# Patient Record
Sex: Male | Born: 1975 | Race: White | Hispanic: No | Marital: Single | Smoking: Former smoker
Health system: Southern US, Community
[De-identification: ages and names within clinical notes are randomized; demographics above are authoritative.]

## PROBLEM LIST (undated history)

## (undated) DIAGNOSIS — Z87442 Personal history of urinary calculi: Secondary | ICD-10-CM

## (undated) DIAGNOSIS — R569 Unspecified convulsions: Secondary | ICD-10-CM

## (undated) DIAGNOSIS — G4730 Sleep apnea, unspecified: Secondary | ICD-10-CM

## (undated) HISTORY — PX: EYE SURGERY: SHX253

## (undated) HISTORY — PX: HERNIA REPAIR: SHX51

## (undated) HISTORY — PX: DEEP BRAIN STIMULATOR PLACEMENT: SHX608

---

## 2000-10-07 ENCOUNTER — Emergency Department (HOSPITAL_COMMUNITY): Admission: EM | Admit: 2000-10-07 | Discharge: 2000-10-07 | Payer: Self-pay

## 2000-10-07 ENCOUNTER — Encounter: Payer: Self-pay | Admitting: Emergency Medicine

## 2000-12-18 ENCOUNTER — Inpatient Hospital Stay (HOSPITAL_COMMUNITY): Admission: EM | Admit: 2000-12-18 | Discharge: 2000-12-20 | Payer: Self-pay | Admitting: Emergency Medicine

## 2000-12-18 ENCOUNTER — Encounter: Payer: Self-pay | Admitting: Pediatrics

## 2000-12-19 ENCOUNTER — Encounter: Payer: Self-pay | Admitting: Pulmonary Disease

## 2001-01-20 ENCOUNTER — Ambulatory Visit (HOSPITAL_COMMUNITY): Admission: RE | Admit: 2001-01-20 | Discharge: 2001-01-20 | Payer: Self-pay | Admitting: *Deleted

## 2001-01-20 ENCOUNTER — Encounter: Payer: Self-pay | Admitting: *Deleted

## 2001-01-21 ENCOUNTER — Emergency Department (HOSPITAL_COMMUNITY): Admission: EM | Admit: 2001-01-21 | Discharge: 2001-01-21 | Payer: Self-pay | Admitting: Emergency Medicine

## 2001-04-24 ENCOUNTER — Encounter: Payer: Self-pay | Admitting: Emergency Medicine

## 2001-04-24 ENCOUNTER — Inpatient Hospital Stay (HOSPITAL_COMMUNITY): Admission: EM | Admit: 2001-04-24 | Discharge: 2001-04-26 | Payer: Self-pay | Admitting: Emergency Medicine

## 2004-07-19 ENCOUNTER — Ambulatory Visit (HOSPITAL_COMMUNITY): Admission: RE | Admit: 2004-07-19 | Discharge: 2004-07-19 | Payer: Self-pay | Admitting: Neurology

## 2004-10-28 ENCOUNTER — Emergency Department (HOSPITAL_COMMUNITY): Admission: EM | Admit: 2004-10-28 | Discharge: 2004-10-28 | Payer: Self-pay | Admitting: Emergency Medicine

## 2006-04-24 ENCOUNTER — Emergency Department (HOSPITAL_COMMUNITY): Admission: EM | Admit: 2006-04-24 | Discharge: 2006-04-24 | Payer: Self-pay | Admitting: Emergency Medicine

## 2006-08-22 ENCOUNTER — Emergency Department (HOSPITAL_COMMUNITY): Admission: EM | Admit: 2006-08-22 | Discharge: 2006-08-23 | Payer: Self-pay | Admitting: Emergency Medicine

## 2006-08-22 ENCOUNTER — Emergency Department (HOSPITAL_COMMUNITY): Admission: EM | Admit: 2006-08-22 | Discharge: 2006-08-22 | Payer: Self-pay | Admitting: Family Medicine

## 2006-11-21 ENCOUNTER — Ambulatory Visit: Payer: Self-pay | Admitting: Internal Medicine

## 2006-11-21 DIAGNOSIS — R569 Unspecified convulsions: Secondary | ICD-10-CM | POA: Insufficient documentation

## 2007-01-24 ENCOUNTER — Ambulatory Visit: Payer: Self-pay | Admitting: Internal Medicine

## 2008-01-09 ENCOUNTER — Ambulatory Visit: Payer: Self-pay | Admitting: Internal Medicine

## 2009-01-14 ENCOUNTER — Ambulatory Visit: Payer: Self-pay | Admitting: Internal Medicine

## 2009-12-24 ENCOUNTER — Ambulatory Visit: Payer: Self-pay | Admitting: Internal Medicine

## 2010-05-06 NOTE — Assessment & Plan Note (Signed)
Summary: flu shot/njr  Nurse Visit  Flu Vaccine Consent Questions     Do you have a history of severe allergic reactions to this vaccine? no    Any prior history of allergic reactions to egg and/or gelatin? no    Do you have a sensitivity to the preservative Thimersol? no    Do you have a past history of Guillan-Barre Syndrome? no    Do you currently have an acute febrile illness? no    Have you ever had a severe reaction to latex? no    Vaccine information given and explained to patient? yes    Are you currently pregnant? no    Lot Number:[id number]   Exp Date:10/01/2009   Site Given  Left Deltoid IM  Orders Added: 1)  Admin 1st Vaccine [90471] 2)  Flu Vaccine 1yrs + [24401]

## 2010-05-06 NOTE — Assessment & Plan Note (Signed)
Summary: FLU-SHOT/RCD  Nurse Visit    Prior Medications: KEPPRA 1000 MG  TABS (LEVETIRACETAM) 21/2 daily LYRICA 225 MG  CAPS (PREGABALIN) one tid LAMICTAL 200 MG  TABS (LAMOTRIGINE) one tid    Influenza Vaccine    Vaccine Type: Fluvax 3+    Given by: Arcola Jansky, RN  Flu Vaccine Consent Questions    Do you have a history of severe allergic reactions to this vaccine? no    Any prior history of allergic reactions to egg and/or gelatin? no    Do you have a sensitivity to the preservative Thimersol? no    Do you have a past history of Guillan-Barre Syndrome? no    Do you currently have an acute febrile illness? no    Have you ever had a severe reaction to latex? no    Vaccine information given and explained to patient? yes   Impression & Recommendations:  Problem # 1:  Preventive Health Care (ICD-V70.0) lot [id number], EXP 30 jun 09, [employer]i pasteur left deltoid IM, 0.5 cc.   Complete Medication List: 1)  Keppra 1000 Mg Tabs (Levetiracetam) .... 21/2 daily 2)  Lyrica 225 Mg Caps (Pregabalin) .... One tid 3)  Lamictal 200 Mg Tabs (Lamotrigine) .... One tid  Other Orders: Flu Vaccine 20yrs + (11914) Admin 1st Vaccine (78295)   Orders Added: 1)  Flu Vaccine 77yrs + [90658] 2)  Admin 1st Vaccine [email or web address].Peer    ]

## 2010-05-06 NOTE — Assessment & Plan Note (Signed)
Summary: flu shot/njr  Nurse Visit    Prior Medications: KEPPRA 1000 MG  TABS (LEVETIRACETAM) 21/2 daily LYRICA 225 MG  CAPS (PREGABALIN) one tid LAMICTAL 200 MG  TABS (LAMOTRIGINE) one tid   Flu Vaccine Consent Questions     Do you have a history of severe allergic reactions to this vaccine? no    Any prior history of allergic reactions to egg and/or gelatin? no    Do you have a sensitivity to the preservative Thimersol? no    Do you have a past history of Guillan-Barre Syndrome? no    Do you currently have an acute febrile illness? no    Have you ever had a severe reaction to latex? no    Vaccine information given and explained to patient? yes    Are you currently pregnant? no    Lot Number:[id number]   Site Given  Left Deltoid IM   Orders Added: 1)  Admin 1st Vaccine [90471] 2)  Flu Vaccine 89yrs + B[place].Dew    ]

## 2010-05-06 NOTE — Assessment & Plan Note (Signed)
Summary: FLU SHOT // RS---PT [facility] // RS  Nurse Visit   Orders Added: 1)  Admin 1st Vaccine [90471] 2)  Flu Vaccine 47yrs + [16109] Flu Vaccine Consent Questions     Do you have a history of severe allergic reactions to this vaccine? no    Any prior history of allergic reactions to egg and/or gelatin? no    Do you have a sensitivity to the preservative Thimersol? no    Do you have a past history of Guillan-Barre Syndrome? no    Do you currently have an acute febrile illness? no    Have you ever had a severe reaction to latex? no    Vaccine information given and explained to patient? yes    Are you currently pregnant? no    Lot Number:[id number]   Exp Date:10/02/2010   Site Given  Left Deltoid IM .lbflu

## 2010-05-06 NOTE — Assessment & Plan Note (Signed)
Summary: new pt to est/jnl//res per dr/jnl   Vital Signs:  Patient Profile:   35 Years Old Male Height:     66 inches Weight:      224 pounds Temp:     98.2 degrees F oral Pulse rate:   78 / minute Pulse rhythm:   regular Resp:     12 per minute BP sitting:   144 / 98  Vitals Entered By: Lynann Beaver CMA (November 21, 2006 2:54 PM)               Chief Complaint:  to establish new md.  History of Present Illness: here to establish with a physician he generally is well. no complaints  long hx of a seizure disorder--no recent recurrence    Past Medical History:    Seizure disorder---developed age 32---currently adequately controlled  Past Surgical History:    Denies surgical history   Family History:    mother--alive and well    father-alive and well  Social History:    Occupation: currently not working    Married    Never Smoked    Alcohol use-no    Regular exercise-no   Risk Factors:  Tobacco use:  never Alcohol use:  no Exercise:  no   Review of Systems       no other complaints in a complete ROS   Physical Exam  General:     Well-developed,well-nourished,in no acute distress; alert,appropriate and cooperative throughout examination Head:     Normocephalic and atraumatic without obvious abnormalities. No apparent alopecia or balding. Mouth:     Oral mucosa and oropharynx without lesions or exudates.  Teeth in good repair. Neck:     No deformities, masses, or tenderness noted. Lungs:     Normal respiratory effort, chest expands symmetrically. Lungs are clear to auscultation, no crackles or wheezes. Heart:     Normal rate and regular rhythm. S1 and S2 normal without gallop, murmur, click, rub or other extra sounds. Abdomen:     Bowel sounds positive,abdomen soft and non-tender without masses, organomegaly or hernias noted. Msk:     No deformity or scoliosis noted of thoracic or lumbar spine.   Pulses:     R and L  carotid,radial,femoral,dorsalis pedis and posterior tibial pulses are full and equal bilaterally Skin:     Intact without suspicious lesions or rashes    Impression & Recommendations:  Problem # 1:  SEIZURE DISORDER (ICD-780.39) Seizures adequately controlled. Meds are prescribed through [facility]. Last Sz---he can't recall. His updated medication list for this problem includes:    Keppra 1000 Mg Tabs (Levetiracetam) .Marland Kitchen... 21/2 daily    Lyrica 225 Mg Caps (Pregabalin) ..... One tid    Lamictal 200 Mg Tabs (Lamotrigine) ..... One three times a day  new patient---instructed on after hours care and availability. he will call for any concerns. Recomended weight loss and daily exercise  His updated medication list for this problem includes:    Keppra 1000 Mg Tabs (Levetiracetam) .Marland Kitchen... 21/2 daily    Lyrica 225 Mg Caps (Pregabalin) ..... One tid    Lamictal 200 Mg Tabs (Lamotrigine) ..... One tid   Complete Medication List: 1)  Keppra 1000 Mg Tabs (Levetiracetam) .... 21/2 daily 2)  Lyrica 225 Mg Caps (Pregabalin) .... One tid 3)  Lamictal 200 Mg Tabs (Lamotrigine) .... One tid

## 2011-01-17 ENCOUNTER — Ambulatory Visit (INDEPENDENT_AMBULATORY_CARE_PROVIDER_SITE_OTHER): Payer: 59

## 2011-01-17 DIAGNOSIS — Z23 Encounter for immunization: Secondary | ICD-10-CM

## 2011-08-09 ENCOUNTER — Emergency Department (HOSPITAL_COMMUNITY)
Admission: EM | Admit: 2011-08-09 | Discharge: 2011-08-10 | Disposition: A | Discharge status: Home / Self Care | Payer: Medicare PPO | Attending: Emergency Medicine | Admitting: Emergency Medicine

## 2011-08-09 ENCOUNTER — Encounter (HOSPITAL_COMMUNITY): Payer: Self-pay | Admitting: *Deleted

## 2011-08-09 DIAGNOSIS — T50901A Poisoning by unspecified drugs, medicaments and biological substances, accidental (unintentional), initial encounter: Secondary | ICD-10-CM

## 2011-08-09 DIAGNOSIS — T426X1A Poisoning by other antiepileptic and sedative-hypnotic drugs, accidental (unintentional), initial encounter: Secondary | ICD-10-CM | POA: Insufficient documentation

## 2011-08-09 DIAGNOSIS — R569 Unspecified convulsions: Secondary | ICD-10-CM | POA: Insufficient documentation

## 2011-08-09 HISTORY — DX: Unspecified convulsions: R56.9

## 2011-08-09 NOTE — ED Notes (Addendum)
Pt. Ambulated without any problems. No s/s of sob or dizziness.

## 2011-08-09 NOTE — ED Notes (Signed)
Received pt. From home via EMS pt. arousable but sleepy, NAD noted, no seizure activity noted,

## 2011-08-09 NOTE — ED Notes (Signed)
Diet given pt. Vomited large amount of undigested food, Dr. Rubin Payor notified, NNO

## 2011-08-09 NOTE — ED Notes (Signed)
Pt. Alert and oriented NAD noted

## 2011-08-09 NOTE — ED Provider Notes (Signed)
History     CSN: [id number]  Arrival date & time 08/09/11  2114   First MD Initiated Contact with Patient 08/09/11 2140      Chief Complaint  Patient presents with  . Weakness  . Drug Overdose    question pt. accidentally took double dose of seizure meds  . Fatigue    (Consider location/radiation/quality/duration/timing/severity/associated sxs/prior treatment) The history is provided by the patient.   patient believes he took an extra dose of his medicines around 3:00 today. He took an extra dose of his of Vimpat, Lamictal Keppra and Lyrica. His been somewhat sedate since 8 to 9:00. He states this is exactly how he felt we took an extra dose accidentally previously. He states he needed help to get up earlier. He states he is not suicidal or homicidal. He states he started to feel better. No nausea or vomiting. No headache. He is asking to eat. He feels as if he can walk now.  Past Medical History  Diagnosis Date  . Seizures     No past surgical history on file.  History reviewed. No pertinent family history.  History  Substance Use Topics  . Smoking status: Not on file  . Smokeless tobacco: Not on file  . Alcohol Use: No      Review of Systems  Constitutional: Negative for activity change and appetite change.  HENT: Negative for neck stiffness.   Eyes: Negative for pain.  Respiratory: Negative for chest tightness and shortness of breath.   Cardiovascular: Negative for chest pain and leg swelling.  Gastrointestinal: Negative for nausea, vomiting, abdominal pain and diarrhea.  Genitourinary: Negative for flank pain.  Musculoskeletal: Negative for back pain.  Skin: Negative for rash.  Neurological: Negative for weakness, numbness and headaches.  Psychiatric/Behavioral: Negative for behavioral problems.    Allergies  Penicillins  Home Medications   Current Outpatient Rx  Name Route Sig Dispense Refill  . LACOSAMIDE 200 MG PO TABS Oral Take 100 mg by mouth 2  (two) times daily.    Marland Kitchen LAMOTRIGINE 200 MG PO TABS Oral Take 200 mg by mouth 3 (three) times daily.     Marland Kitchen LEVETIRACETAM 1000 MG PO TABS Oral Take by mouth 2 (two) times daily.     Marland Kitchen PREGABALIN 225 MG PO CAPS Oral Take 225 mg by mouth 2 (two) times daily.      BP 129/64  Pulse 84  Temp(Src) 97.7 F (36.5 C) (Oral)  Resp 16  SpO2 98%  Physical Exam  Nursing note and vitals reviewed. Constitutional: He is oriented to person, place, and time. He appears well-developed and well-nourished.  HENT:  Head: Normocephalic and atraumatic.  Eyes: EOM are normal. Pupils are equal, round, and reactive to light.       Pupils are mildly dilated to approximately 4 mm  Neck: Normal range of motion. Neck supple.  Cardiovascular: Normal rate, regular rhythm and normal heart sounds.   No murmur heard. Pulmonary/Chest: Effort normal and breath sounds normal.  Abdominal: Soft. Bowel sounds are normal. He exhibits no distension and no mass. There is no tenderness. There is no rebound and no guarding.  Musculoskeletal: Normal range of motion. He exhibits no edema.  Neurological: He is alert and oriented to person, place, and time. No cranial nerve deficit.       Patient is awake and appropriate, somewhat sedate.  Skin: Skin is warm and dry.  Psychiatric: He has a normal mood and affect.    ED Course  Procedures (  including critical care time)  Labs Reviewed - No data to display No results found.   1. Accidental drug overdose      Date: 08/09/2011  Rate: 106  Rhythm: sinus tachycardia  QRS Axis: normal  Intervals: normal  ST/T Wave abnormalities: normal  Conduction Disutrbances:none  Narrative Interpretation:   Old EKG Reviewed: unchanged    MDM  Patient was a likely accidental overdose of his medications. Mental status had been decreased, but is improved. Poison control was consulted, and recommended for patient to go home after he was able to ambulate and was more awake. Patient was  watched in the ER, and was fed and ambulated and felt better. He was discharged home        Juliet Rude. Rubin Payor, MD 08/10/11 1610

## 2011-08-10 NOTE — ED Notes (Signed)
Pt. Discharged to home NAD noted, pt. Ambulatory gait steady,

## 2011-08-10 NOTE — Discharge Instructions (Signed)
Accidental Overdose  A drug overdose occurs when a chemical substance (drug or medication) is used in amounts large enough to overcome a person. This may result in severe illness or death. This is a type of poisoning. Accidental overdoses of medications or other substances come from a variety of reasons. When this happens accidentally, it is often because the person taking the substance does not know enough about what they have taken. Drugs which commonly cause overdose deaths are alcohol, psychotropic medications (medications which affect the mind), pain medications, illegal drugs (street drugs) such as cocaine and heroin, and multiple drugs taken at the same time. It may result from careless behavior (such as over-indulging at a party). Other causes of overdose may include multiple drug use, a lapse in memory, or drug use after a period of no drug use.   Sometimes overdosing occurs because a person cannot remember if they have taken their medication.   A common unintentional overdose in young children involves multi-vitamins containing iron. Iron is a part of the hemoglobin molecule in blood. It is used to transport oxygen to living cells. When taken in small amounts, iron allows the body to restock hemoglobin. In large amounts, it causes problems in the body. If this overdose is not treated, it can lead to death.  Never take medicines that show signs of tampering or do not seem quite right. Never take medicines in the dark or in poor lighting. Read the label and check each dose of medicine before you take it. When adults are poisoned, it happens most often through carelessness or lack of information. Taking medicines in the dark or taking medicine prescribed for someone else to treat the same type of problem is a dangerous practice.  SYMPTOMS   Symptoms of overdose depend on the medication and amount taken. They can vary from over-activity with stimulant over-dosage, to sleepiness from depressants such as  alcohol, narcotics and tranquilizers. Confusion, dizziness, nausea and vomiting may be present. If problems are severe enough coma and death may result.  DIAGNOSIS   Diagnosis and management are generally straightforward if the drug is known. Otherwise it is more difficult. At times, certain symptoms and signs exhibited by the patient, or blood tests, can reveal the drug in question.   TREATMENT   In an emergency department, most patients can be treated with supportive measures. Antidotes may be available if there has been an overdose of opioids or benzodiazepines. A rapid improvement will often occur if this is the cause of overdose.  At home or away from medical care:   There may be no immediate problems or warning signs in children.   Not everything works well in all cases of poisoning.   Take immediate action. Poisons may act quickly.   If you think someone has swallowed medicine or a household product, and the person is unconscious, having seizures (convulsions), or is not breathing, immediately call for an ambulance.  IF a person is conscious and appears to be doing OK but has swallowed a poison:   Do not wait to see what effect the poison will have. Immediately call a poison control center (listed in the white pages of your telephone book under "Poison Control" or inside the front cover with other emergency numbers). Some poison control centers have TTY capability for the deaf. Check with your local center if you or someone in your family requires this service.   Keep the container so you can read the label on the product for ingredients.     Describe what, when, and how much was taken and the age and condition of the person poisoned. Inform them if the person is vomiting, choking, drowsy, shows a change in color or temperature of skin, is conscious or unconscious, or is convulsing.   Do not cause vomiting unless instructed by medical personnel. Do not induce vomiting or force liquids into a person who  is convulsing, unconscious, or very drowsy.  Stay calm and in control.    Activated charcoal also is sometimes used in certain types of poisoning and you may wish to add a supply to your emergency medicines. It is available without a prescription. Call a poison control center before using this medication.  PREVENTION   Thousands of children die every year from unintentional poisoning. This may be from household chemicals, poisoning from carbon monoxide in a car, taking their parent's medications, or simply taking a few iron pills or vitamins with iron. Poisoning comes from unexpected sources.   Store medicines out of the sight and reach of children, preferably in a locked cabinet. Do not keep medications in a food cabinet. Always store your medicines in a secure place. Get rid of expired medications.   If you have children living with you or have them as occasional guests, you should have child-resistant caps on your medicine containers. Keep everything out of reach. Child proof your home.   If you are called to the telephone or to answer the door while you are taking a medicine, take the container with you or put the medicine out of the reach of small children.   Do not take your medication in front of children. Do not tell your child how good a medication is and how good it is for them. They may get the idea it is more of a treat.   If you are an adult and have accidentally taken an overdose, you need to consider how this happened and what can be done to prevent it from happening again. If this was from a street drug or alcohol, determine if there is a problem that needs addressing. If you are not sure a problems exists, it is easy to talk to a professional and ask them if they think you have a problem. It is better to handle this problem in this way before it happens again and has a much worse consequence.  Document Released: 06/04/2004 Document Revised: 03/10/2011 Document Reviewed: 11/10/2008  [employer]  Patient Information 2012 [employer].

## 2012-01-06 ENCOUNTER — Ambulatory Visit: Payer: Medicare PPO

## 2012-01-18 ENCOUNTER — Ambulatory Visit: Payer: Medicare PPO

## 2012-01-20 ENCOUNTER — Ambulatory Visit: Payer: Medicare PPO

## 2012-01-24 ENCOUNTER — Ambulatory Visit (INDEPENDENT_AMBULATORY_CARE_PROVIDER_SITE_OTHER): Payer: Medicare PPO

## 2012-01-24 DIAGNOSIS — Z23 Encounter for immunization: Secondary | ICD-10-CM

## 2012-07-13 ENCOUNTER — Encounter: Payer: Self-pay | Admitting: Nurse Practitioner

## 2012-07-13 ENCOUNTER — Ambulatory Visit (INDEPENDENT_AMBULATORY_CARE_PROVIDER_SITE_OTHER): Payer: Medicaid Other | Admitting: Nurse Practitioner

## 2012-07-13 VITALS — BP 119/77 | HR 81 | Ht 66.0 in | Wt 222.0 lb

## 2012-07-13 DIAGNOSIS — G40109 Localization-related (focal) (partial) symptomatic epilepsy and epileptic syndromes with simple partial seizures, not intractable, without status epilepticus: Secondary | ICD-10-CM | POA: Insufficient documentation

## 2012-07-13 DIAGNOSIS — G40309 Generalized idiopathic epilepsy and epileptic syndromes, not intractable, without status epilepticus: Secondary | ICD-10-CM

## 2012-07-13 DIAGNOSIS — G40209 Localization-related (focal) (partial) symptomatic epilepsy and epileptic syndromes with complex partial seizures, not intractable, without status epilepticus: Secondary | ICD-10-CM

## 2012-07-13 MED ORDER — LEVETIRACETAM 1000 MG PO TABS: ORAL_TABLET | ORAL | Status: DC

## 2012-07-13 MED ORDER — PREGABALIN 225 MG PO CAPS: 225.0000 mg | ORAL_CAPSULE | Freq: Three times a day (TID) | ORAL | Status: DC

## 2012-07-13 MED ORDER — LACOSAMIDE 100 MG PO TABS: ORAL_TABLET | ORAL | Status: DC

## 2012-07-13 MED ORDER — LAMOTRIGINE 200 MG PO TABS: 200.0000 mg | ORAL_TABLET | Freq: Three times a day (TID) | ORAL | Status: DC

## 2012-07-13 NOTE — Progress Notes (Signed)
HPI: Patient returns for followup after her last visit 06/30/2011. 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 He has been followed in this office for many years. He did have an evaluation at [facility] and he was not felt to be a candidate for epilepsy surgery. He has declined vagal nerve stimulator implant in the past. He has failed Dilantin, Depakote, and Zonegran. He is currently on Vimpat, Keppra, Lamictal, and Lyrica which has reduced his seizure frequency to less than once a week. Most recent seizure occurred after missing a days worth of his medications. He is satisfied with his seizure control at this time. He has had seizures with sleep deprivation in the past .   ROS:   seizure, depression, anxiety  Physical Exam General: well developed, well nourished, seated, in no evident distress Head: head normocephalic and atraumatic. Oropharynx benign Neck: supple with no carotid or supraclavicular bruits Cardiovascular: regular rate and rhythm, no murmurs  Neurologic Exam Mental Status: Awake and fully alert. Oriented to place and time. Attention span, concentration and fund of knowledge appropriate. Mood and affect appropriate.  Cranial Nerves: Pupils equal, briskly reactive to light. Extraocular movements full without nystagmus. Visual fields full to confrontation. Hearing intact and symmetric to finger snap. Facial sensation intact. Face, tongue, palate move normally and symmetrically. Neck flexion and extension normal.  Motor: Normal bulk and tone. Normal strength in all tested extremity muscles. Sensory.: intact to touch and pinprick and vibratory.  Coordination: Rapid alternating movements normal in all extremities. Finger-to-nose and heel-to-shin performed accurately bilaterally. Gait and Station: Arises from chair without difficulty. Stance is normal. Gait demonstrates normal stride length and balance . Able to heel, toe and tandem  walk without difficulty.  Reflexes: 2+ and symmetric. Toes downgoing.     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 Recent seizure after missing a days worth of his medication. Currently on Keppra, Lyrica, Lamictal, and Vimpat.     PLAN: Renew meds Will get CBC and CMP Followup yearly and as necessary   Nilda Riggs, GNP-BC APRN

## 2012-07-13 NOTE — Patient Instructions (Addendum)
Please continue Vimpat, Lamictal, Keppra, and Lyrica at current doses Will renew prescriptions CBC, CMP today Followup yearly

## 2012-07-14 LAB — COMPREHENSIVE METABOLIC PANEL
ALT: 49 IU/L — ABNORMAL HIGH (ref 0–44)
AST: 30 IU/L (ref 0–40)
Albumin/Globulin Ratio: 1.8 (ref 1.1–2.5)
Albumin: 4.6 g/dL (ref 3.5–5.5)
Alkaline Phosphatase: 90 IU/L (ref 39–117)
BUN/Creatinine Ratio: 10 (ref 8–19)
BUN: 12 mg/dL (ref 6–20)
CO2: 25 mmol/L (ref 19–28)
Calcium: 9.8 mg/dL (ref 8.7–10.2)
Chloride: 104 mmol/L (ref 97–108)
Creatinine, Ser: 1.18 mg/dL (ref 0.76–1.27)
GFR calc Af Amer: 90 mL/min/{1.73_m2} (ref >59)
GFR calc non Af Amer: 78 mL/min/{1.73_m2} (ref >59)
Globulin, Total: 2.5 g/dL (ref 1.5–4.5)
Glucose: 81 mg/dL (ref 65–99)
Potassium: 4.6 mmol/L (ref 3.5–5.2)
Sodium: 143 mmol/L (ref 134–144)
Total Bilirubin: 0.5 mg/dL (ref 0.0–1.2)
Total Protein: 7.1 g/dL (ref 6.0–8.5)

## 2012-07-14 LAB — CBC
HCT: 44.5 % (ref 37.5–51.0)
Hemoglobin: 15 g/dL (ref 12.6–17.7)
MCH: 31.3 pg (ref 26.6–33.0)
MCHC: 33.7 g/dL (ref 31.5–35.7)
MCV: 93 fL (ref 79–97)
Platelets: 367 10*3/uL (ref 155–379)
RBC: 4.79 x10E6/uL (ref 4.14–5.80)
RDW: 12.8 % (ref 12.3–15.4)
WBC: 7.3 10*3/uL (ref 3.4–10.8)

## 2012-07-16 NOTE — Progress Notes (Signed)
Quick Note:  Labs are ok, please call patient ______

## 2012-10-30 ENCOUNTER — Telehealth: Payer: Self-pay | Admitting: Nurse Practitioner

## 2012-10-30 NOTE — Telephone Encounter (Signed)
Spoke to patient's mom and she will fax over the jury notice for CM to look at. She is aware that CM is on vacation and will fax it over on Friday. She is also aware that letters can take up to 14 days. She is fine with that.

## 2012-11-06 ENCOUNTER — Encounter: Payer: Self-pay | Admitting: Nurse Practitioner

## 2012-11-09 ENCOUNTER — Telehealth: Payer: Self-pay

## 2012-11-09 NOTE — Telephone Encounter (Signed)
I called patient and spoke with father. Jury Duty Release letter is complete and will be at front desk after lunch for pick up.

## 2012-12-29 ENCOUNTER — Other Ambulatory Visit: Payer: Self-pay | Admitting: Nurse Practitioner

## 2012-12-31 ENCOUNTER — Other Ambulatory Visit: Payer: Self-pay

## 2012-12-31 MED ORDER — LACOSAMIDE 100 MG PO TABS: ORAL_TABLET | ORAL | Status: DC

## 2012-12-31 MED ORDER — PREGABALIN 225 MG PO CAPS: 225.0000 mg | ORAL_CAPSULE | Freq: Three times a day (TID) | ORAL | Status: DC

## 2013-01-01 NOTE — Telephone Encounter (Signed)
Rx signed and faxed.

## 2013-01-02 ENCOUNTER — Telehealth: Payer: Self-pay | Admitting: Nurse Practitioner

## 2013-01-02 NOTE — Telephone Encounter (Signed)
Rx's were already signed and faxed.  Confirmation received on 01/01/13 at 7:31am.  I called the pharmacy.  Spoke with [employer].  He said they already picked up Lyrica and Vimpat yesterday, there is one other med ready for pick up today and a fourth med they did not have in stock, so they had to order it.  I called the patient back.  Spoke with dad.  He is aware refills for all meds are on file and will follow up with the pharmacy.

## 2013-01-16 ENCOUNTER — Ambulatory Visit (INDEPENDENT_AMBULATORY_CARE_PROVIDER_SITE_OTHER): Payer: Medicare HMO

## 2013-01-16 DIAGNOSIS — Z23 Encounter for immunization: Secondary | ICD-10-CM

## 2013-07-04 ENCOUNTER — Other Ambulatory Visit: Payer: Self-pay

## 2013-07-04 ENCOUNTER — Telehealth: Payer: Self-pay | Admitting: Neurology

## 2013-07-04 MED ORDER — LAMOTRIGINE 200 MG PO TABS: 200.0000 mg | ORAL_TABLET | Freq: Three times a day (TID) | ORAL | Status: DC

## 2013-07-04 MED ORDER — PREGABALIN 225 MG PO CAPS: 225.0000 mg | ORAL_CAPSULE | Freq: Three times a day (TID) | ORAL | Status: DC

## 2013-07-04 MED ORDER — LACOSAMIDE 100 MG PO TABS: ORAL_TABLET | ORAL | Status: DC

## 2013-07-04 MED ORDER — LEVETIRACETAM 1000 MG PO TABS: ORAL_TABLET | ORAL | Status: DC

## 2013-07-04 NOTE — Telephone Encounter (Signed)
Pt's father is currently at the drug store.  Waiting for the Lyrica to be refilled.  Please call the father [phone] if there is any problems with this prescription.  Thank you

## 2013-07-04 NOTE — Telephone Encounter (Signed)
Pt's mother calling about the Lyrica prescription.  Can you please call the Pt's mother to let her know when it is being sent to the [facility].  She says that she has called them twice today and they have not received authorization for the refill.  She asked if this can be done before the end of business today.  Thank you

## 2013-07-04 NOTE — Telephone Encounter (Signed)
I already spoke with father regarding Rx today.  I called and spoke with mom.  Advised we will send Rx as soon as it has been signed.  She verbalized understanding.

## 2013-07-04 NOTE — Telephone Encounter (Signed)
Rx signed and faxed.

## 2013-07-30 ENCOUNTER — Telehealth: Payer: Self-pay | Admitting: *Deleted

## 2013-07-30 NOTE — Telephone Encounter (Signed)
Mother calling stating pharmacy needing authorization for Vimpat.  Thanks

## 2013-07-30 NOTE — Telephone Encounter (Signed)
All requested info has been faxed.

## 2013-08-27 ENCOUNTER — Ambulatory Visit (INDEPENDENT_AMBULATORY_CARE_PROVIDER_SITE_OTHER): Payer: Medicare HMO | Admitting: Neurology

## 2013-08-27 ENCOUNTER — Encounter: Payer: Self-pay | Admitting: Neurology

## 2013-08-27 VITALS — BP 129/79 | HR 83

## 2013-08-27 DIAGNOSIS — G40802 Other epilepsy, not intractable, without status epilepticus: Secondary | ICD-10-CM

## 2013-08-27 DIAGNOSIS — H5500 Unspecified nystagmus: Secondary | ICD-10-CM

## 2013-08-27 DIAGNOSIS — I951 Orthostatic hypotension: Secondary | ICD-10-CM | POA: Insufficient documentation

## 2013-08-27 DIAGNOSIS — Z79899 Other long term (current) drug therapy: Secondary | ICD-10-CM | POA: Insufficient documentation

## 2013-08-27 MED ORDER — PREGABALIN 225 MG PO CAPS: 225.0000 mg | ORAL_CAPSULE | Freq: Three times a day (TID) | ORAL | Status: DC

## 2013-08-27 MED ORDER — LEVETIRACETAM 1000 MG PO TABS: ORAL_TABLET | ORAL | Status: DC

## 2013-08-27 MED ORDER — LACOSAMIDE 200 MG PO TABS: 100.0000 mg | ORAL_TABLET | Freq: Two times a day (BID) | ORAL | Status: DC

## 2013-08-27 MED ORDER — LAMOTRIGINE 200 MG PO TABS: 200.0000 mg | ORAL_TABLET | Freq: Three times a day (TID) | ORAL | Status: DC

## 2013-08-27 NOTE — Progress Notes (Signed)
[facility] Revisit for seizures, on 4 medications.   PCP is Dr. Birdie Sons, MD -Gregg Young.   HPI: Patient returns for followup after his last visit 04/ 14/14 . 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 He has been followed in this office for many years, last by Dr .Sharene Skeans and Elveria Rising, NP. before by Jenene Slicker and Dr. Thad Ranger.    He did have an evaluation at [facility] and he was not felt to be a candidate for epilepsy surgery. EMU stay for 36 hours, generalized seizures, multiple foci.  He has declined vagal nerve stimulator implant in the past. He has failed Dilantin, Depakote, and Zonegran. He is currently on Vimpat, Keppra, Lamictal, and Lyrica which has reduced his seizure frequency to less than once a week.  Most recent seizure occurred after missing a days worth of his medications. He is satisfied with his seizure control at this time. He has had seizures with sleep deprivation in the past.   Gregg Young appears syndromic, he has unusually shaped ears , very different from is father, he has abnormal eye movements and he has macroglossia. He had a normal school career, no learning disabilities.  He was 25 when he first suffered a seizure. His last seizure was 12 days ago. No aura,   Spells described as ataxia of gait and one arm would extend and shake ,bilateral  fisted hands. No fall and no generalized convulsions.   Cryptogenic seizures.     ROS:  Seizure, depression, anxiety.  Physical Exam General: well developed, well nourished, seated, in no evident distress Head: head normocephalic and atraumatic.  Oropharynx benign, Macroglossia, Neck 18 inches.  Neck: supple with no carotid or supraclavicular bruits Cardiovascular: regular rate and rhythm, no murmurs  Neurologic Exam Mental Status: Awake and fully alert. Oriented to place and time. Attention span, concentration and fund of knowledge appropriate. Mood  and affect appropriate.   Cranial Nerves: Pupils equal, briskly reactive to light. Extraocular movements full with nystagmus in all 4 planes, very irregular eye movements, left eye diverted out and up.  Diplopia .  Visual fields full to confrontation.  Hearing intact and symmetric to finger snap. Facial sensation intact. Face, tongue, palate move normally and symmetrically. Neck flexion and extension normal.  Motor: Normal bulk and tone. Normal strength in all tested extremity muscles. Sensory.: intact to touch and pinprick and vibratory.  Coordination: Rapid alternating movements normal in all extremities. Finger-to-nose is ataxic, dysmetric, tremor.  Gait and Station: Arises from chair without difficulty. Stance is normal.  Gait demonstrates normal stride length and balance . unable to heel, toe and tandem walk -  Reflexes: 2+ and symmetric, brisk. . Toes downgoing.     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 Recent seizure after missing a days worth of his medication. Currently on Keppra, Lyrica, Lamictal, and Vimpat. He was on Depakote and Topamax in the past. He had LFT elevation and kidney stones in the past.   EMU records from [facility] .  I am concerned about toxic levels of Vimpat.   2- 4 month Rv with Gregg Young- needs med adjustment ,       PLAN: Renew meds Will get CBC and CMP Followup yearly and as necessary   Melvyn Novas, MD

## 2013-08-27 NOTE — Addendum Note (Signed)
Addended by: Melvyn Novas on: 08/27/2013 03:31 PM   Modules accepted: Orders

## 2013-08-29 LAB — CBC
HCT: 41.4 % (ref 37.5–51.0)
Hemoglobin: 14.5 g/dL (ref 12.6–17.7)
MCH: 31.9 pg (ref 26.6–33.0)
MCHC: 35 g/dL (ref 31.5–35.7)
MCV: 91 fL (ref 79–97)
Platelets: 325 10*3/uL (ref 150–379)
RBC: 4.54 x10E6/uL (ref 4.14–5.80)
RDW: 13.1 % (ref 12.3–15.4)
WBC: 6.2 10*3/uL (ref 3.4–10.8)

## 2013-08-29 LAB — COMPREHENSIVE METABOLIC PANEL
ALT: 25 IU/L (ref 0–44)
AST: 16 IU/L (ref 0–40)
Albumin/Globulin Ratio: 2.1 (ref 1.1–2.5)
Albumin: 4.6 g/dL (ref 3.5–5.5)
Alkaline Phosphatase: 90 IU/L (ref 39–117)
BUN/Creatinine Ratio: 10 (ref 8–19)
BUN: 11 mg/dL (ref 6–20)
CO2: 23 mmol/L (ref 18–29)
Calcium: 9.4 mg/dL (ref 8.7–10.2)
Chloride: 105 mmol/L (ref 97–108)
Creatinine, Ser: 1.11 mg/dL (ref 0.76–1.27)
GFR calc Af Amer: 96 mL/min/{1.73_m2} (ref >59)
GFR calc non Af Amer: 83 mL/min/{1.73_m2} (ref >59)
Globulin, Total: 2.2 g/dL (ref 1.5–4.5)
Glucose: 79 mg/dL (ref 65–99)
Potassium: 3.9 mmol/L (ref 3.5–5.2)
Sodium: 143 mmol/L (ref 134–144)
Total Bilirubin: 0.5 mg/dL (ref 0.0–1.2)
Total Protein: 6.8 g/dL (ref 6.0–8.5)

## 2013-08-29 LAB — LAMOTRIGINE LEVEL: Lamotrigine Lvl: 19.1 ug/mL (ref 2.0–20.0)

## 2013-08-29 LAB — LACOSAMIDE: Lacosamide: 4.4 ug/mL — ABNORMAL LOW (ref 5.0–10.0)

## 2013-08-29 NOTE — Progress Notes (Signed)
Quick Note:  Shared normal CMP results,verbalized understanding ______

## 2013-10-02 ENCOUNTER — Other Ambulatory Visit: Payer: Self-pay

## 2013-10-02 DIAGNOSIS — Z79899 Other long term (current) drug therapy: Secondary | ICD-10-CM

## 2013-10-02 DIAGNOSIS — I951 Orthostatic hypotension: Secondary | ICD-10-CM

## 2013-10-02 DIAGNOSIS — G40802 Other epilepsy, not intractable, without status epilepticus: Secondary | ICD-10-CM

## 2013-10-02 MED ORDER — PREGABALIN 225 MG PO CAPS
225.0000 mg | ORAL_CAPSULE | Freq: Three times a day (TID) | ORAL | Status: DC
Start: 2013-10-02 — End: 2014-01-01

## 2013-10-02 MED ORDER — LACOSAMIDE 200 MG PO TABS: ORAL_TABLET | ORAL | Status: DC

## 2013-10-02 NOTE — Telephone Encounter (Signed)
Rx signed and faxed.

## 2013-10-08 ENCOUNTER — Other Ambulatory Visit: Payer: Self-pay

## 2013-10-08 DIAGNOSIS — I951 Orthostatic hypotension: Secondary | ICD-10-CM

## 2013-10-08 DIAGNOSIS — G40802 Other epilepsy, not intractable, without status epilepticus: Secondary | ICD-10-CM

## 2013-10-08 DIAGNOSIS — Z79899 Other long term (current) drug therapy: Secondary | ICD-10-CM

## 2013-10-08 MED ORDER — LACOSAMIDE 100 MG PO TABS
ORAL_TABLET | ORAL | Status: DC
Start: 2013-10-08 — End: 2013-12-07

## 2013-10-08 NOTE — Telephone Encounter (Signed)
Rx signed and faxed.

## 2013-10-08 NOTE — Telephone Encounter (Signed)
Patient has been taking Vimpat 100mg  one in the morning and 2 at night.

## 2013-10-13 ENCOUNTER — Telehealth: Payer: Self-pay

## 2013-10-13 NOTE — Telephone Encounter (Signed)
[employer] sent us a letter saying they have approved our request for coverage on Vimpat effective until 04/03/2014 Ref Member # [id number] Fax Ref # [id number] 11/2012 [id number]  [id number]

## 2013-10-27 ENCOUNTER — Other Ambulatory Visit: Payer: Self-pay | Admitting: Neurology

## 2013-10-29 ENCOUNTER — Telehealth: Payer: Self-pay | Admitting: Neurology

## 2013-10-29 NOTE — Telephone Encounter (Signed)
The Vimpat Rx was sent to the pharmacy on 07/07.  I called the pharmacy.  Spoke with Bed Bath [facility] BeyondKelly.  She said they do have refills on file, but the Rx was just filled 7/6, so it's too soon to refill at this time.  I called Ms Craige CottaKirby back.  She said they have plenty of meds and do not need to pick it up at this time.  She is aware a Rx is saved on file at [facility] and will call them when a refill is needed. She verified the patient is taking 100mg  in am and 200mg  in pm

## 2013-10-29 NOTE — Telephone Encounter (Signed)
Patient's mother calling to state that the pharmacy did not approve patient's Vimpat refill, they only approved Lamictal and Keppra. Please return call to patient's mother and advise.

## 2013-11-27 ENCOUNTER — Ambulatory Visit: Payer: Medicare HMO | Admitting: Adult Health

## 2013-12-04 ENCOUNTER — Ambulatory Visit: Payer: Medicare HMO | Admitting: Adult Health

## 2013-12-07 ENCOUNTER — Encounter (HOSPITAL_COMMUNITY): Payer: Self-pay | Admitting: Emergency Medicine

## 2013-12-07 ENCOUNTER — Emergency Department (HOSPITAL_COMMUNITY)
Admission: EM | Admit: 2013-12-07 | Discharge: 2013-12-07 | Disposition: A | Discharge status: Home / Self Care | Payer: Medicare HMO | Attending: Emergency Medicine | Admitting: Emergency Medicine

## 2013-12-07 DIAGNOSIS — Z880 Allergy status to penicillin: Secondary | ICD-10-CM | POA: Insufficient documentation

## 2013-12-07 DIAGNOSIS — G40909 Epilepsy, unspecified, not intractable, without status epilepticus: Secondary | ICD-10-CM | POA: Insufficient documentation

## 2013-12-07 DIAGNOSIS — Z87891 Personal history of nicotine dependence: Secondary | ICD-10-CM | POA: Diagnosis not present

## 2013-12-07 DIAGNOSIS — Z79899 Other long term (current) drug therapy: Secondary | ICD-10-CM | POA: Diagnosis not present

## 2013-12-07 MED ORDER — LORAZEPAM 1 MG PO TABS: 1.0000 mg | ORAL_TABLET | Freq: Three times a day (TID) | ORAL | Status: DC | PRN

## 2013-12-07 MED ORDER — ONDANSETRON 4 MG PO TBDP: 4.0000 mg | ORAL_TABLET | ORAL | Status: DC | PRN

## 2013-12-07 MED ORDER — PROMETHAZINE HCL 25 MG PO TABS: 25.0000 mg | ORAL_TABLET | Freq: Four times a day (QID) | ORAL | Status: DC | PRN

## 2013-12-07 NOTE — ED Notes (Addendum)
Per EMS parents awoke to patient dry heaving, found him in bed talking "gibberish." Has a sz history but has never been like this in the past. Pt did take all regular sz meds this am, parents noticed had taken all his daily meds. Patient normally stays up most of night playing video games and sleeps until 2 pm, normally takes meds at that time. Last known well at 11 pm last night when parents went to bed.  Pt now AAO reports had a grand mal sz.

## 2013-12-07 NOTE — Discharge Instructions (Signed)

## 2013-12-07 NOTE — ED Provider Notes (Signed)
CSN: [id number]     Arrival date & time 12/07/13  0746 History   First MD Initiated Contact with Patient 12/07/13 [phone]     Chief Complaint  Patient presents with  . Seizures     (Consider location/radiation/quality/duration/timing/severity/associated sxs/prior Treatment) HPI  Per EMS parents awoke to patient dry heaving, found him in bed talking "gibberish." Has a sz history but has never been like this in the past. Pt did take all regular sz meds this am, parents noticed had taken all his daily meds. Patient normally stays up most of night playing video games and sleeps until 2 pm, normally takes meds at that time. Last known well at 11 pm last night when parents went to bed. The patient has long-standing seizure history. It sounds like the patient is compliant with medications. He has had a recent dose adjustments. The patient reports in the past week his Vimpat dose was increased. The patient has a followup appointment in 4 days with neurology. The patient denies that he has been suffering from any problems with headaches. He does describe some prodromal symptoms yesterday evening. He typically gets spots in her vision which she did notice. The patient also has a history of significant post ictal phase which may be up to 12 hours. He is sleepy right now but he is appropriate in answering all questions with good cognitive inside recall. This actually would be the time he would be sleeping normally. He apparently lives on more of a second shift type lifestyle whereby he stays up late in the evenings playing video games. Sleeps in the morning. Then takes meals starting around midday. The patient has denied all positives on review of systems and considers that he has been feeling well without any symptoms of illness over these past few days.  Past Medical History  Diagnosis Date  . Seizures    History reviewed. No pertinent past surgical history. Family History  Problem Relation Age of Onset  .  Healthy Mother   . Healthy Father    History  Substance Use Topics  . Smoking status: Former Games developer  . Smokeless tobacco: Never Used  . Alcohol Use: No    Review of Systems 10 systems reviewed and negative.   Allergies  Penicillins  Home Medications   Prior to Admission medications   Medication Sig Start Date End Date Taking? Authorizing Provider  Lacosamide 100 MG TABS 1 tab in the am, 2 at bedtime 10/08/13   Melvyn Novas, MD  lamoTRIgine (LAMICTAL) 200 MG tablet TAKE ONE TABLET BY MOUTH THREE TIMES DAILY     Melvyn Novas, MD  levETIRAcetam (KEPPRA) 1000 MG tablet TAKE TWO AND ONE-HALF TABLETS BY MOUTH TWICE DAILY     Porfirio Mylar Dohmeier, MD  pregabalin (LYRICA) 225 MG capsule Take 1 capsule (225 mg total) by mouth 3 (three) times daily. 10/02/13   Carmen Dohmeier, MD   BP 127/81  Pulse 91  Temp(Src) 97.9 F (36.6 C) (Oral)  Resp 18  SpO2 95% Physical Exam  Constitutional: Patient is slightly drowsy but he awakens appropriately to have excellent recall and normal cognitive function. He is well-nourished well-developed and nontoxic in appearance. Head face: Patient is normocephalic atraumatic. Eyes: Pupils are mid range at approximately 3 mm. Extraocular motions are intact pupils are symmetric to light response. Patient does have lateral my statements. Oral cavity: Makes membranes are pink and moist dentition is in fair condition posterior oropharynx is widely patent Neck: Supple without meningismus Lungs: Clear to auscultation no  wheeze rhonchi rail Heart: Regular rate and rhythm no murmur gallop Abdomen: Soft nontender no appreciable mass. Back: Normal visual inspection no evidence of injury. Extremities: Both upper and lower extremities are in good condition without signs of injury no peripheral edema Neurologic: Mental status as per above. C. strength flexion and extension as well as lower extremity strength flexion and extension are 5 out of 5 with excellent strength.  Patellar reflexes are 2+ and symmetric. Skin: Warm and dry without rashes.  ED Course  Procedures (including critical care time) Labs Review Labs Reviewed - No data to display  Imaging Review No results found.   EKG Interpretation None      MDM   Final diagnoses:  Seizure disorder   At this point in time the patient has a known seizure history. Review of systems and physical examination did not reveal signs of acute illness. There is no headache history or drug abuse history. By history the patient has had significantly intractable seizure history. At this point in time I will prescribe some PRN Ativan. The patient has neurology followup within the next 4 days.    Arby Barrette, MD 12/07/13 1041

## 2013-12-12 ENCOUNTER — Ambulatory Visit (INDEPENDENT_AMBULATORY_CARE_PROVIDER_SITE_OTHER): Payer: Medicare HMO | Admitting: Adult Health

## 2013-12-12 ENCOUNTER — Encounter: Payer: Self-pay | Admitting: Adult Health

## 2013-12-12 VITALS — BP 165/91 | HR 111

## 2013-12-12 DIAGNOSIS — G40802 Other epilepsy, not intractable, without status epilepticus: Secondary | ICD-10-CM

## 2013-12-12 DIAGNOSIS — R42 Dizziness and giddiness: Secondary | ICD-10-CM

## 2013-12-12 DIAGNOSIS — R479 Unspecified speech disturbances: Secondary | ICD-10-CM

## 2013-12-12 DIAGNOSIS — R4789 Other speech disturbances: Secondary | ICD-10-CM

## 2013-12-12 NOTE — Patient Instructions (Signed)
Nonepileptic Seizures °Nonepileptic seizures are seizures that are not caused by abnormal electrical signals in your brain. These seizures often seem like epileptic seizures, but they are not caused by epilepsy.  °There are two types of nonepileptic seizures: °· A physiologic nonepileptic seizure results from a disruption in your brain. °· A psychogenic seizure results from emotional stress. These seizures are sometimes called pseudoseizures. °CAUSES  °Causes of physiologic nonepileptic seizures include:  °· Sudden drop in blood pressure. °· Low blood sugar. °· Low levels of salt (sodium) in your blood. °· Low levels of calcium in your blood. °· Migraine. °· Heart rhythm problems. °· Sleep disorders. °· Drug and alcohol abuse. °Common causes of psychogenic nonepileptic seizures include: °· Stress. °· Emotional trauma. °· Sexual or physical abuse. °· Major life events, such as divorce or the death of a loved one. °· Mental health disorders, including panic attack and hyperactivity disorder. °SIGNS AND SYMPTOMS °A nonepileptic seizure can look like an epileptic seizure, including uncontrollable shaking (convulsions), or changes in attention, behavior, or the ability to remain awake and alert. However, there are some differences. Nonepileptic seizures usually: °· Do not cause physical injuries. °· Start slowly. °· Include crying or shrieking. °· Last longer than 2 minutes. °· Have a short recovery time without headache or exhaustion. °DIAGNOSIS  °Your health care provider can usually diagnose nonepileptic seizures after taking your medical history and giving you a physical exam. Your health care provider may want to talk to your friends or relatives who have seen you have a seizure.  °You may also need to have tests to look for causes of physiologic nonepileptic seizures. This may include an electroencephalogram (EEG), which is a test that measures electrical activity in your brain. If you have had an epileptic  seizure, the results of your EEG will be abnormal. If your health care provider thinks you have had a psychogenic nonepileptic seizure, you may need to see a mental health specialist for an evaluation. °TREATMENT  °Treatment depends on the type and cause of your seizures. °· For physiologic nonepileptic seizures, treatment is aimed at addressing the underlying condition that caused the seizures. These seizures usually stop when the underlying condition is properly treated. °· Nonepileptic seizures do not respond to the seizure medicines used to treat epilepsy. °· For psychogenic seizures, you may need to work with a mental health specialist. °HOME CARE INSTRUCTIONS °Home care will depend on the type of nonepileptic seizures you have.  °· Follow all your health care provider's instructions. °· Keep all your follow-up appointments. °SEEK MEDICAL CARE IF: °You continue to have seizures after treatment. °SEEK IMMEDIATE MEDICAL CARE IF: °· Your seizures change or become more frequent. °· You injure yourself during a seizure. °· You have one seizure after another. °· You have trouble recovering from a seizure. °· You have chest pain or trouble breathing. °MAKE SURE YOU: °· Understand these instructions. °· Will watch your condition. °· Will get help right away if you are not doing well or get worse. °Document Released: 05/06/2005 Document Revised: 08/05/2013 Document Reviewed: 01/15/2013 °ExitCare® Patient Information ©2015 [employer]. This information is not intended to replace advice given to you by your health care provider. Make sure you discuss any questions you have with your health care provider. ° °

## 2013-12-12 NOTE — Progress Notes (Signed)
PATIENT: Gregg Young DOB: 04/06/1975  REASON FOR VISIT: follow up HISTORY FROM: patient  HISTORY OF PRESENT ILLNESS: Gregg Young is a 38 year old male with a history of seizures. He returns today for follow-up. He is currently taking Keppra, Vimpat, Lamictal and lyrica. He reports that last week he was taken to the emergency room because he parents woke up to him dry heaving and talking" gibberish." Once at the ED he became alert and oriented.  Parents states that he has been having ongoing dizzy episodes usually occuring right after taking his medication. Today while walking back to the exam room he became dizzy and fell. His dad was able to catch him. He was not injured. Patient states while out in the lobby he had another episode of dizziness and his speech became affected. He was able to communicate but only in a whisper. Denies LOC.  He states that he cannot stand- states that he feels weak and unsteady. Parents are adamant that he is "very complainant" with his medication. However instead of taking the Lamictal and lyrica three times a day he takes two tablets when he wakes up and then 1 tablet at bedtime. He normally goes to bed at 6:00 am and wakes up around 2:00-3:00 pm. Patient continued to whisper throughout the visit. Family was insistent that Dr. Vickey Huger come to see the patient while he was having this "episode." While waiting for Dr. Vickey Huger, he regained his speech. He then confirms to me that he doesn't always take his medication the right way or dosage. He denies any recreational drug use or ETOH. Although he does state before he started having seizures he would smoke marijuana and take pain pills occasionally.   HISTORY 08/27/13 (CD): Patient returns for follow up after his last visit 04/ 14/14 . 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 He has been followed in this office for many years, last by Dr .Sharene Skeans and Elveria Rising, NP. before by  Jenene Slicker and Dr. Thad Ranger.  He did have an evaluation at [facility] and he was not felt to be a candidate for epilepsy surgery. EMU stay for 36 hours, generalized seizures, multiple foci.  He has declined vagal nerve stimulator implant in the past. He has failed Dilantin, Depakote, and Zonegran.  He is currently on Vimpat, Keppra, Lamictal, and Lyrica which has reduced his seizure frequency to less than once a week.  Most recent seizure occurred after missing a days worth of his medications. He is satisfied with his seizure control at this time. He has had seizures with sleep deprivation in the past.  Gregg Young appears syndromic, he has unusually shaped ears , very different from is father, he has abnormal eye movements and he has macroglossia. He had a normal school career, no learning disabilities. He was 25 when he first suffered a seizure. His last seizure was 12 days ago. No aura, Spells described as ataxia of gait and one arm would extend and shake ,bilateral fisted hands. No fall and no generalized convulsions.   REVIEW OF SYSTEMS: Full 14 system review of systems performed and notable only for:  Constitutional: N/A  Eyes: N/A Ear/Nose/Throat: N/A  Skin: N/A  Cardiovascular: N/A  Respiratory: N/A  Gastrointestinal: Vomiting  Genitourinary: N/A Hematology/Lymphatic: N/A  Endocrine: N/A Musculoskeletal:N/A  Allergy/Immunology: N/A  Neurological: Dizziness, seizure Psychiatric: Agitation Sleep: Daytime sleepiness, acting out dreams   ALLERGIES: Allergies  Allergen Reactions  . Penicillins  Rash    HOME MEDICATIONS: Outpatient Prescriptions Prior to Visit  Medication Sig Dispense Refill  . lacosamide (VIMPAT) 200 MG TABS tablet Take 200-400 mg by mouth 2 (two) times daily. Take one tablet by mouth every morning and take two tablets at bedtime      . lamoTRIgine (LAMICTAL) 200 MG tablet Take 200 mg by mouth 3 (three) times daily.      Marland Kitchen  levETIRAcetam (KEPPRA) 1000 MG tablet Take 2,500 mg by mouth 2 (two) times daily. Take two and one-half tablets by mouth twice daily      . LORazepam (ATIVAN) 1 MG tablet Take 1 tablet (1 mg total) by mouth 3 (three) times daily as needed for anxiety. Take one three times a day if needed for increased seizure frequency  15 tablet  0  . ondansetron (ZOFRAN ODT) 4 MG disintegrating tablet Take 1 tablet (4 mg total) by mouth every 4 (four) hours as needed for nausea or vomiting.  20 tablet  0  . pregabalin (LYRICA) 225 MG capsule Take 1 capsule (225 mg total) by mouth 3 (three) times daily.  90 capsule  1  . promethazine (PHENERGAN) 25 MG tablet Take 1 tablet (25 mg total) by mouth every 6 (six) hours as needed for nausea or vomiting.  20 tablet  0   No facility-administered medications prior to visit.    PAST MEDICAL HISTORY: Past Medical History  Diagnosis Date  . Seizures     PAST SURGICAL HISTORY: No past surgical history on file.  FAMILY HISTORY: Family History  Problem Relation Age of Onset  . Healthy Mother   . Healthy Father     SOCIAL HISTORY: History   Social History  . Marital Status: Single    Spouse Name: N/A    Number of Children: 0  . Years of Education: HS   Occupational History  . Not on file.   Social History Main Topics  . Smoking status: Former Games developer  . Smokeless tobacco: Never Used  . Alcohol Use: No  . Drug Use: No  . Sexual Activity: Not on file   Other Topics Concern  . Not on file   Social History Narrative   Patient is single and lives with his parents.   Patient has a high school education.   Patient is right-handed.   Patient does not have any children.   Patient is on disability.   Patient drinks three sodas daily.      PHYSICAL EXAM  Filed Vitals:   12/12/13 1459  BP: 165/91  Pulse: 111   Cannot calculate BMI with a height equal to zero.  Generalized: Well developed, in no acute distress   Neurological examination    Mentation: Alert oriented to time, place, history taking. Follows all commands speech and language fluent Cranial nerve II-XII: Pupils were equal round reactive to light. Extraocular movements were full, visual field were full on confrontational test. Facial sensation and strength were normal. hearing was intact to finger rubbing bilaterally.  Head turning and shoulder shrug were normal and symmetric. Motor: The motor testing reveals 5 over 5 strength of all 4 extremities. Good symmetric motor tone is noted throughout.  Sensory: Sensory testing is intact to soft touch on all 4 extremities. No evidence of extinction is noted.  Coordination: Cerebellar testing reveals ataxic, dysmetric  finger-nose-finger and heel-to-shin bilaterally.  Gait and station: Patient is in a wheelchair due to fall coming into the office. He is able to stand but is very  unsteady.  Reflexes: Deep tendon reflexes are symmetric and normal bilaterally.     DIAGNOSTIC DATA (LABS, IMAGING, TESTING) - I reviewed patient records, labs, notes, testing and imaging myself where available.  Lab Results  Component Value Date   WBC 6.2 08/27/2013   HGB 14.5 08/27/2013   HCT 41.4 08/27/2013   MCV 91 08/27/2013   PLT 325 08/27/2013      Component Value Date/Time   NA 143 08/27/2013 1531   K 3.9 08/27/2013 1531   CL 105 08/27/2013 1531   CO2 23 08/27/2013 1531   GLUCOSE 79 08/27/2013 1531   BUN 11 08/27/2013 1531   CREATININE 1.11 08/27/2013 1531   CALCIUM 9.4 08/27/2013 1531   PROT 6.8 08/27/2013 1531   AST 16 08/27/2013 1531   ALT 25 08/27/2013 1531   ALKPHOS 90 08/27/2013 1531   BILITOT 0.5 08/27/2013 1531   GFRNONAA 83 08/27/2013 1531   GFRAA 96 08/27/2013 1531       ASSESSMENT AND PLAN 38 y.o. year old male  has a past medical history of Seizures. here with:  1. Seizures 2. Speech changes- episodic 3. Dizziness  I have consulted with Dr. Vickey Huger, it is not clear why he has episodes that causes him to speak in a whisper  and also affecting his balance and gait. At this time we will check blood work and look at the drug levels. I will also check MRI of the brain for any acute changes. I have advised the patient to take his medication as directed. He verbalized understanding. I have also encouraged him to promote better sleep hygiene. He should try going to bed earlier such as at midnight and awaking at 9:00-10:00am. Patient states that he will try to make these changes. We will also refer him to Dr. Karel Jarvis to get her opinion regarding a VNS and his current treatment for seizures. Patient is to let us know if he has worsening of symptoms or develops new symptoms. Otherwise he will follow-up in 3 months or sooner if needed.   Butch Penny, MSN, NP-C 12/12/2013, 2:52 PM [facility] [address], [address] [place], [place] [address] [phone]  Note: This document was prepared with digital dictation and possible smart phrase technology. Any transcriptional errors that result from this process are unintentional.

## 2013-12-13 NOTE — Progress Notes (Signed)
I agree with the assessment and plan as directed by NP .The patient is known to me .  The patient advised that he may have had difficulties with regular medication intake.    Alaijah Gibler, MD

## 2013-12-14 LAB — LEVETIRACETAM LEVEL: Levetiracetam Lvl: 61.7 ug/mL

## 2013-12-14 LAB — LAMOTRIGINE LEVEL: Lamotrigine Lvl: 10.4 ug/mL (ref 4.0–18.0)

## 2013-12-15 ENCOUNTER — Other Ambulatory Visit: Payer: Self-pay | Admitting: Adult Health

## 2013-12-16 ENCOUNTER — Telehealth: Payer: Self-pay | Admitting: Adult Health

## 2013-12-16 LAB — CBC WITH DIFFERENTIAL
Basophils Absolute: 0.1 10*3/uL (ref 0.0–0.2)
Basos: 1 %
Eos: 5 %
Eosinophils Absolute: 0.3 10*3/uL (ref 0.0–0.4)
HCT: 44.2 % (ref 37.5–51.0)
Hemoglobin: 15.5 g/dL (ref 12.6–17.7)
Immature Grans (Abs): 0 10*3/uL (ref 0.0–0.1)
Immature Granulocytes: 0 %
Lymphocytes Absolute: 1.3 10*3/uL (ref 0.7–3.1)
Lymphs: 20 %
MCH: 32.2 pg (ref 26.6–33.0)
MCHC: 35.1 g/dL (ref 31.5–35.7)
MCV: 92 fL (ref 79–97)
Monocytes Absolute: 0.5 10*3/uL (ref 0.1–0.9)
Monocytes: 7 %
Neutrophils Absolute: 4.3 10*3/uL (ref 1.4–7.0)
Neutrophils Relative %: 67 %
Platelets: 379 10*3/uL (ref 150–379)
RBC: 4.81 x10E6/uL (ref 4.14–5.80)
RDW: 13.3 % (ref 12.3–15.4)
WBC: 6.5 10*3/uL (ref 3.4–10.8)

## 2013-12-16 LAB — COMPREHENSIVE METABOLIC PANEL
ALT: 30 IU/L (ref 0–44)
AST: 20 IU/L (ref 0–40)
Albumin/Globulin Ratio: 1.8 (ref 1.1–2.5)
Albumin: 4.6 g/dL (ref 3.5–5.5)
Alkaline Phosphatase: 93 IU/L (ref 39–117)
BUN/Creatinine Ratio: 8 (ref 8–19)
BUN: 8 mg/dL (ref 6–20)
CO2: 22 mmol/L (ref 18–29)
Calcium: 9.7 mg/dL (ref 8.7–10.2)
Chloride: 102 mmol/L (ref 97–108)
Creatinine, Ser: 0.99 mg/dL (ref 0.76–1.27)
GFR calc Af Amer: 110 mL/min/{1.73_m2} (ref >59)
GFR calc non Af Amer: 96 mL/min/{1.73_m2} (ref >59)
Globulin, Total: 2.6 g/dL (ref 1.5–4.5)
Glucose: 103 mg/dL — ABNORMAL HIGH (ref 65–99)
Potassium: 4.2 mmol/L (ref 3.5–5.2)
Sodium: 141 mmol/L (ref 134–144)
Total Bilirubin: 0.4 mg/dL (ref 0.0–1.2)
Total Protein: 7.2 g/dL (ref 6.0–8.5)

## 2013-12-16 LAB — LEVETIRACETAM LEVEL: Levetiracetam Lvl: 85.2 ug/mL — ABNORMAL HIGH (ref 10.0–40.0)

## 2013-12-16 LAB — LAMOTRIGINE LEVEL: Lamotrigine Lvl: 17.8 ug/mL (ref 2.0–20.0)

## 2013-12-16 LAB — ZONISAMIDE LEVEL: Zonisamide: NOT DETECTED ug/mL (ref 10.0–40.0)

## 2013-12-16 MED ORDER — LEVETIRACETAM 1000 MG PO TABS: 2000.0000 mg | ORAL_TABLET | Freq: Two times a day (BID) | ORAL | Status: DC

## 2013-12-16 NOTE — Telephone Encounter (Signed)
I called the patient spoke to his mother regarding his lab results. His Keppra level are elevated. We will decrease to 2 tablets BID. She verbalized understanding. I am still waiting on additional blood work to result. I will call her with those results once available to me.

## 2013-12-18 ENCOUNTER — Telehealth: Payer: Self-pay | Admitting: Adult Health

## 2013-12-18 LAB — SPECIMEN STATUS REPORT

## 2013-12-18 LAB — LACOSAMIDE: Lacosamide: 7.3 ug/mL (ref 5.0–10.0)

## 2013-12-18 NOTE — Telephone Encounter (Signed)
I called the patient and spoke to his father. His lamictal and Vimpat levels came back in normal range. He indicates that his son had another episode on Sunday but has not had another since then. His Keppra levels were elevated and we have decreased his dosage. If patient has any other issues/concerns he should call our office.

## 2013-12-26 ENCOUNTER — Ambulatory Visit
Admission: RE | Admit: 2013-12-26 | Discharge: 2013-12-26 | Disposition: A | Discharge status: Home / Self Care | Payer: Medicare HMO | Source: Ambulatory Visit | Attending: Adult Health | Admitting: Adult Health

## 2013-12-26 DIAGNOSIS — R4789 Other speech disturbances: Secondary | ICD-10-CM

## 2013-12-26 DIAGNOSIS — R479 Unspecified speech disturbances: Secondary | ICD-10-CM

## 2013-12-26 MED ORDER — GADOBENATE DIMEGLUMINE 529 MG/ML IV SOLN
20.0000 mL | Freq: Once | INTRAVENOUS | Status: AC | PRN
  Administered 2013-12-26: 20 mL via INTRAVENOUS

## 2013-12-30 ENCOUNTER — Telehealth: Payer: Self-pay | Admitting: *Deleted

## 2013-12-30 NOTE — Telephone Encounter (Signed)
Left voice message of normal MRI of the brain, and to call back with any questions or concerns.

## 2014-01-01 ENCOUNTER — Other Ambulatory Visit: Payer: Self-pay | Admitting: Adult Health

## 2014-01-01 DIAGNOSIS — Z79899 Other long term (current) drug therapy: Secondary | ICD-10-CM

## 2014-01-01 DIAGNOSIS — G40802 Other epilepsy, not intractable, without status epilepticus: Secondary | ICD-10-CM

## 2014-01-01 DIAGNOSIS — I951 Orthostatic hypotension: Secondary | ICD-10-CM

## 2014-01-01 MED ORDER — PREGABALIN 225 MG PO CAPS: 225.0000 mg | ORAL_CAPSULE | Freq: Three times a day (TID) | ORAL | Status: DC

## 2014-01-01 MED ORDER — LACOSAMIDE 200 MG PO TABS: ORAL_TABLET | ORAL | Status: DC

## 2014-01-01 NOTE — Telephone Encounter (Signed)
Patient's mother was instructed by [facility] to call and request Rx refill for pregabalin (LYRICA) 225 MG capsule.  Please call anytime and may leave detailed message on voicemail.

## 2014-01-01 NOTE — Telephone Encounter (Signed)
Request forwarded to provider for approval  

## 2014-01-02 ENCOUNTER — Other Ambulatory Visit: Payer: Self-pay | Admitting: Neurology

## 2014-01-02 ENCOUNTER — Telehealth: Payer: Self-pay | Admitting: Neurology

## 2014-01-02 ENCOUNTER — Telehealth: Payer: Self-pay | Admitting: Adult Health

## 2014-01-02 DIAGNOSIS — I951 Orthostatic hypotension: Secondary | ICD-10-CM

## 2014-01-02 DIAGNOSIS — R569 Unspecified convulsions: Secondary | ICD-10-CM

## 2014-01-02 DIAGNOSIS — Z79899 Other long term (current) drug therapy: Secondary | ICD-10-CM

## 2014-01-02 MED ORDER — PREGABALIN 225 MG PO CAPS: 225.0000 mg | ORAL_CAPSULE | Freq: Three times a day (TID) | ORAL | Status: DC

## 2014-01-02 NOTE — Telephone Encounter (Signed)
Pt's Rx was faxed to [facility], confirmation received.

## 2014-01-02 NOTE — Telephone Encounter (Signed)
Called pt's mother Steward DroneBrenda and left message informing her that the pt's Rx was sent to his pharmacy and if the pt has any other problems, questions or concerns to call the office.

## 2014-01-02 NOTE — Telephone Encounter (Signed)
Patient's mother Steward DroneBrenda has questions regarding [facility] referral.  If calling after 5:00 call mobile [phone] and before 5:00 call work # [phone].

## 2014-01-02 NOTE — Telephone Encounter (Signed)
I called the patient's mother explained the reason for the referral to Dr. Karel JarvisAquino. She verbalized understanding. She also was inquiring about a refill for lyrica. She states that she called and requested the refill on Monday. I explained that our computer system was down as well as our phones. She tried calling on Tuesday but she states the the answer machine said we were closed. There is documentation that she called on Wednesday and the prescription was taken care of.

## 2014-01-03 NOTE — Telephone Encounter (Signed)
Pt's Rx was faxed to [facility] at [phone], confirmation received.

## 2014-01-08 ENCOUNTER — Ambulatory Visit: Payer: Medicare HMO

## 2014-01-13 ENCOUNTER — Encounter: Payer: Self-pay | Admitting: Neurology

## 2014-01-13 ENCOUNTER — Ambulatory Visit (INDEPENDENT_AMBULATORY_CARE_PROVIDER_SITE_OTHER): Payer: Medicare HMO | Admitting: Neurology

## 2014-01-13 VITALS — BP 128/76 | HR 83 | Resp 18 | Ht 66.0 in | Wt 217.0 lb

## 2014-01-13 DIAGNOSIS — G40211 Localization-related (focal) (partial) symptomatic epilepsy and epileptic syndromes with complex partial seizures, intractable, with status epilepticus: Secondary | ICD-10-CM

## 2014-01-13 NOTE — Progress Notes (Signed)
NEUROLOGY CONSULTATION NOTE  Gregg Young MRN: [id number] DOB: 12-25-1975  Referring provider: Dr. Porfirio Mylararmen Dohmeier Primary care provider: Dr. Birdie SonsBruce Swords  Reason for consult:  Intractable seizures, consideration for VNS  Dear Dr Vickey Hugerohmeier:  Thank you for your kind referral of Gregg Young for consultation of the above symptoms. Although his history is well known to you, please allow me to reiterate it for the purpose of our medical record. The patient was accompanied to the clinic by both his parents who also provide collateral information. Records and images were personally reviewed where available.  HISTORY OF PRESENT ILLNESS: This is a 38 year old right-handed man with a history of seizures since age 38. He has no recollection of events, no prior warning, witnessed by his mother to have a generalized convulsion lasting 90 to 120 seconds. [facility] then had another convulsion 2 days later. They recall trying different medications, Depakote caused liver dysfunction, he has failed Dilantin and Zonegran. He has been on Keppra, Lamictal, Lyrica, and most recently Vimpat.  He had [facility], records unavailable for review, per notes he stayed for 36 hours and had generalized seizures, multiple foci.  They report two admissions for status epilepticus, one in September 2002 in the setting of weaning off Depakote, and another in January 2003.  Records unavailable for review.  His last GTC was around 5 years ago. He continued to have "petit mals" several times a day where his eyes would roll back, hands would shake for 30-45 seconds, if standing he would fall and had required sutures and staples in the past. He had been having the "petit mals" several times daily until 3 weeks ago when he started having a different type of episode and the petit mals "completely stopped."  He was brought to the ER on 12/07/13 when his parents awoke to him dry having and speaking  gibberish. He would be unable to speak, control his limbs, and cannot stand up without assistance. He can hear people around him but his jaw feels tight. The episodes can last for several hours. He has had 4 episodes in the past 3 weeks.  The patient reports that they have been occurring only during the weekends, however his parents remind him he had one on Wednesday. He went to his neurologist's office on 09/10 where he had an episode while walking to the exam room where he became dizzy and fell.  His father was able to catch him, and they reported his speech became affected. He was noted to be speaking in a whisper throughout the visit.  He does note that he becomes more dizzy after taking his medications.  AED levels were checked, and Keppra level was 85.2 and dose was reduced from 2500mg  BID to 2000mg  BID. Lamictal level was 17.8, Vimpat level 7.3. He continues on Lamictal 200mg  TID, Lyrica 225mg  BID, Vimpat 200mg  TID.  The patient lives with his parents and brother. He is on disability and mostly stays at home playing video games. He denies any headaches, diplopia, blurred vision, dysarthria, dysphagia, focal numbness/tingling/weakness, neck/back pain. He graduated high school, no special ed classes. He endorses olfactory hallucinations but cannot describe it except saying they are the same all the time. He denies any myoclonic jerks, no nausea, vomiting.  He usually sleeps from 3am to 4pm.    Epilepsy Risk Factors:  He had a skull fracture on the right side after a fall at 6 months of  age. Otherwise he had a normal birth and early development.  There is no history of febrile convulsions, CNS infections such as meningitis/encephalitis, neurosurgical procedures, or family history of seizures.  Prior AEDs: Depakote, Dilantin, Zonegran EEGs: none available for review, per records "generalized seizures, multiple foci" MRI: I personally reviewed MRI brain with and without contrast done 12/26/2013 which did  not show any acute intracranial abnormality, hippocampi symmetric without abnormal signal or enhancement.  PAST MEDICAL HISTORY: Past Medical History  Diagnosis Date  . Seizures     PAST SURGICAL HISTORY: No past surgical history on file.  MEDICATIONS: Current Outpatient Prescriptions on File Prior to Visit  Medication Sig Dispense Refill  . lamoTRIgine (LAMICTAL) 200 MG tablet Take 200 mg by mouth 3 (three) times daily.      Marland Kitchen levETIRAcetam (KEPPRA) 1000 MG tablet Take 2 tablets (2,000 mg total) by mouth 2 (two) times daily.      Lyrica 225mg  BID Vimpat 200mg  TID No current facility-administered medications on file prior to visit.    ALLERGIES: Allergies  Allergen Reactions  . Penicillins Rash    FAMILY HISTORY: Family History  Problem Relation Age of Onset  . Healthy Mother   . Healthy Father     SOCIAL HISTORY: History   Social History  . Marital Status: Single    Spouse Name: N/A    Number of Children: 0  . Years of Education: HS   Occupational History  . Not on file.   Social History Main Topics  . Smoking status: Former Games developer  . Smokeless tobacco: Never Used  . Alcohol Use: No  . Drug Use: No  . Sexual Activity: Not on file   Other Topics Concern  . Not on file   Social History Narrative   Patient is single and lives with his parents.   Patient has a high school education.   Patient is right-handed.   Patient does not have any children.   Patient is on disability.   Patient drinks three sodas daily.    REVIEW OF SYSTEMS: Constitutional: No fevers, chills, or sweats, no generalized fatigue, change in appetite Eyes: No visual changes, double vision, eye pain Ear, nose and throat: No hearing loss, ear pain, nasal congestion, sore throat Cardiovascular: No chest pain, palpitations Respiratory:  No shortness of breath at rest or with exertion, wheezes GastrointestinaI: No nausea, vomiting, diarrhea, abdominal pain, fecal  incontinence Genitourinary:  No dysuria, urinary retention or frequency Musculoskeletal:  No neck pain, back pain Integumentary: No rash, pruritus, skin lesions Neurological: as above Psychiatric: No depression, insomnia, anxiety Endocrine: No palpitations, fatigue, diaphoresis, mood swings, change in appetite, change in weight, increased thirst Hematologic/Lymphatic:  No anemia, purpura, petechiae. Allergic/Immunologic: no itchy/runny eyes, nasal congestion, recent allergic reactions, rashes  PHYSICAL EXAM: Filed Vitals:   01/13/14 1328  BP: 128/76  Pulse: 83  Resp: 18   General: No acute distress Head:  atraumatic Eyes: Fundoscopic exam shows bilateral sharp discs, no vessel changes, exudates, or hemorrhages Neck: supple, no paraspinal tenderness, full range of motion Back: No paraspinal tenderness Heart: regular rate and rhythm Lungs: Clear to auscultation bilaterally. Vascular: No carotid bruits. Skin/Extremities: No rash, no edema Neurological Exam: Mental status: alert and oriented to person, place, and time, no dysarthria or aphasia, Fund of knowledge is appropriate.  Recent and remote memory are intact.  Attention and concentration are normal.    Able to name objects and repeat phrases. Cranial nerves: CN I: not tested CN II: pupils equal, round  and reactive to light, visual fields intact, fundi unremarkable. CN III, IV, VI:  full range of motion, no nystagmus, no ptosis CN V: facial sensation intact CN VII: upper and lower face symmetric CN VIII: hearing intact to finger rub CN IX, X: gag intact, uvula midline CN XI: sternocleidomastoid and trapezius muscles intact CN XII: tongue midline Bulk & Tone: normal, no fasciculations. Motor: 5/5 throughout with no pronator drift. Sensation: intact to light touch, cold, pin, vibration and joint position sense.  No extinction to double simultaneous stimulation.  Romberg test negative Deep Tendon Reflexes: +2 throughout, no  ankle clonus Plantar responses: downgoing bilaterally Cerebellar: no incoordination on finger to nose, heel to shin. No dysdiadochokinesia Gait: narrow-based and steady, able to tandem walk adequately. Tremor: none  IMPRESSION: This is a 38 year old right-handed man with a history of seizures since age [age].  Per records, he has multifocal epilepsy, records [facility] will be requested for review.  His parents were reporting multiple daily episodes of "petit mals" despite high doses of 4 AEDs. Interestingly, the multiple daily episodes completely stopped 3 weeks ago, and now he has been having prolonged episodes of speech difficulties with incoordination and dizziness.  MRI brain unremarkable. The prolonged new type of episodes are concerning for non-epileptic events. The multiple daily "petit mals" that have completely disappeared also raise the question of co-existing non-epileptic events. He does have a history of epilepsy with status epilepticus in 2002 and 2003, I would be very careful with tapering down his current AEDs.  He will be scheduled for a 48-hour EEG to further classify these episodes. If normal, we will plan to very slowly taper one of his medications, possibly the Vimpat, which was most recently added.  We discussed different types of seizures, including non-epileptic seizures, he may benefit from psychology follow-up in the future. He does not drive and understands [place] driving laws to stop driving after a seizure, until 6 months seizure-free. He will follow-up to discuss EEG results. I will keep you updated.  Thank you for allowing me to participate in the care of this patient. Please do not hesitate to call for any questions or concerns.   Patrcia DollyKaren Jaylyn Booher, M.D.  CC: Dr. Vickey Hugerohmeier

## 2014-01-13 NOTE — Patient Instructions (Signed)
1. Schedule 48-hour EEG 2. Continue all your current medications 3. Keep a calendar of your symptoms 4. Follow-up in after EEG

## 2014-01-15 ENCOUNTER — Encounter: Payer: Self-pay | Admitting: Neurology

## 2014-01-15 ENCOUNTER — Ambulatory Visit (INDEPENDENT_AMBULATORY_CARE_PROVIDER_SITE_OTHER): Payer: Medicare HMO

## 2014-01-15 DIAGNOSIS — Z23 Encounter for immunization: Secondary | ICD-10-CM

## 2014-01-15 DIAGNOSIS — G40119 Localization-related (focal) (partial) symptomatic epilepsy and epileptic syndromes with simple partial seizures, intractable, without status epilepticus: Secondary | ICD-10-CM

## 2014-01-15 DIAGNOSIS — G40219 Localization-related (focal) (partial) symptomatic epilepsy and epileptic syndromes with complex partial seizures, intractable, without status epilepticus: Secondary | ICD-10-CM | POA: Insufficient documentation

## 2014-01-16 NOTE — Progress Notes (Signed)
Dear Gregg Young, Thank you for seeing this young man with his long standing seizure history. I  reviewed your assessment and plan and agree fully,  Thanks for all your help!     Paullette Mckain, MD

## 2014-03-17 ENCOUNTER — Ambulatory Visit: Payer: Medicare HMO | Admitting: Adult Health

## 2014-03-17 ENCOUNTER — Ambulatory Visit: Payer: Medicare HMO | Admitting: Neurology

## 2014-03-17 DIAGNOSIS — G40211 Localization-related (focal) (partial) symptomatic epilepsy and epileptic syndromes with complex partial seizures, intractable, with status epilepticus: Secondary | ICD-10-CM

## 2014-03-21 ENCOUNTER — Telehealth: Payer: Self-pay | Admitting: Neurology

## 2014-03-21 ENCOUNTER — Other Ambulatory Visit: Payer: Self-pay | Admitting: Family Medicine

## 2014-03-21 MED ORDER — PREGABALIN 225 MG PO CAPS: 225.0000 mg | ORAL_CAPSULE | Freq: Three times a day (TID) | ORAL | Status: DC

## 2014-03-21 MED ORDER — LEVETIRACETAM 1000 MG PO TABS: ORAL_TABLET | ORAL | Status: DC

## 2014-03-21 MED ORDER — LACOSAMIDE 200 MG PO TABS: ORAL_TABLET | ORAL | Status: DC

## 2014-03-21 MED ORDER — LAMOTRIGINE 200 MG PO TABS: 200.0000 mg | ORAL_TABLET | Freq: Three times a day (TID) | ORAL | Status: DC

## 2014-03-21 NOTE — Telephone Encounter (Signed)
Pt canceled his f/u appt for 03/24/14. Pt will call later to r/s

## 2014-03-21 NOTE — Telephone Encounter (Signed)
Spoke to Gregg Young's father. The 48-hour EEG is abnormal (with multifocal discharges, right temp slowing), he had a "petit mal" with EEG changes, however the fall he had on 12/16 at 8:30am did not show any EEG change. His father reported that the falls and dizziness seem to occur when he did not eat much prior to taking his medication. He has not had a PCP visit in 2 years. Would do general check-up first, we discussed better eating habits but father said it is hard to control with Eissa's sleep schedule. Discussed option of increasing Lyrica to 250mg  BID, agreed to hold off until after PCP visit and f/u in 2 months.

## 2014-03-21 NOTE — Telephone Encounter (Signed)
John, Pt's father called requesting a refill for her son's meds.  Pt has a new Pharmacy-[facility] [address]  C/b [phone]

## 2014-03-24 ENCOUNTER — Ambulatory Visit: Payer: Medicare HMO | Admitting: Neurology

## 2014-03-24 NOTE — Telephone Encounter (Signed)
After verifying patient's meds & doses with his dad, Rx's were sent to his pharmacy.

## 2014-03-26 ENCOUNTER — Encounter: Payer: Self-pay | Admitting: Family Medicine

## 2014-03-26 ENCOUNTER — Ambulatory Visit (INDEPENDENT_AMBULATORY_CARE_PROVIDER_SITE_OTHER): Payer: Medicare HMO | Admitting: Family Medicine

## 2014-03-26 VITALS — BP 140/94 | Temp 98.1°F | Wt 224.0 lb

## 2014-03-26 DIAGNOSIS — R42 Dizziness and giddiness: Secondary | ICD-10-CM

## 2014-03-26 DIAGNOSIS — R739 Hyperglycemia, unspecified: Secondary | ICD-10-CM

## 2014-03-26 NOTE — Progress Notes (Signed)
Gregg ConchStephen Douglass Dunshee, MD Phone: [phone]  Subjective:   Gregg Young is a 38 y.o. year old very pleasant male patient who presents with the following:  Severe Dizziness Stopped having petit mals per father and then 2-3 months ago started having periods where he could not talk and he was severely dizzy. Has had several dizzy episodes lasting up to an hour 2-3 x a week. Usually occurs at 7-8pm at night if hasn't eaten yet after eating a normal meal and taking meds at 1-3:30 (the time at which he wakes up).  Has had falls associated with dizziness back 2 weeks ago. Drinks mainly soda but not a lot of water. Stays up until 6 am most nights and describes himself as "second shift"  Patient with extensive evaluation by Dr. Karel JarvisAquino on 01/13/2014 after shift in his seizure pattern. He had an MRI which was unremarkable. There was a concern for non-epileptic events and consideration of tapering AEDs but 48 hour EEG performed first. EEG was read on 03/21/14 and was abnormal with multifocal discharges, right temporal slowing. He had a fall during this time with no EEG changes. Due to a report from father thatit seems to be when he does not eat much, neurology asked patient to be evaluated by PCP who he had not seen in 2 years.   ROS- no chst pain, shortness of breath, palpitations, abnormal limb movements  Past Medical History- Epilepsy, history of orthostatic hypotension  Medications- reviewed and updated Current Outpatient Prescriptions  Medication Sig Dispense Refill  . lacosamide (VIMPAT) 200 MG TABS tablet Take 1 tablet every morning & 2 tablets at bedtime. 90 tablet 4  . lamoTRIgine (LAMICTAL) 200 MG tablet Take 1 tablet (200 mg total) by mouth 3 (three) times daily. 90 tablet 4  . levETIRAcetam (KEPPRA) 1000 MG tablet Take 2 & 1/2 tablets twice daily 150 tablet 4  . pregabalin (LYRICA) 225 MG capsule Take 1 capsule (225 mg total) by mouth 3 (three) times daily. 90 capsule 4   No current  facility-administered medications for this visit.    Objective: BP 140/94 mmHg  Temp(Src) 98.1 F (36.7 C)  Wt 224 lb (101.606 kg) Gen: NAD, resting comfortably in bed HEENT: "bags under eyes" NCAT. PERRLA.  CV: RRR no mrg  Lungs: CTAB  Abd: soft/nontender/nondistended/normal bowel sounds  MSK: moves all extremities, no edema  Skin: warm and dry, no rash  Neuro: CN II-XII intact, sensation normal throughout, 5/5 muscle strength in bilateral upper and lower extremities. Normal finger to nose. Normal rapid alternating movements. No pronator drift.    Orthostatics Laying 130/84 HR 83 Sitting 140/94 HR 90 Standing 130/90 HR98  Assessment/Plan:  Severe Dizziness Does not sound to be arhythmia and patient low risk (could consider 24 hour holter monitor) in future. Has had extensive neurology eval and has known epiliepsy history but current episodes may not be attributable to that. Orthostatics negative though with soda intake and caffeine, dehydration could be a factor so we discussed cutting down on soda.   I had a long discussion with patient and father that I am concerned about patient's unhealthy habits including staying up to 6 am and not waking up to 1-3:30PM and then erratic eating schedule. We set patient up with a home glucometer and asked father to check his CBGs when he has similar dizzy spells to ensure hypoglycemia not playing a role. We also set up an extensive lifestyle modification plan to hopefully at least allow him to be awake by 11:30  am. See AVS for full details. Discussed being more active and exercising.   Strict return precautions advised.   nonfasting- Check Basic labs today.  Orders Placed This Encounter  Procedures  . CBC    [place]  . Comprehensive metabolic panel    [place]  . Hemoglobin A1c    [place]  . TSH    [place]   >50% of 30 minute office visit was spent on counseling (see AVS for summarized plan of needed changes but we discussed/negotiated  for these) and coordination of care

## 2014-03-26 NOTE — Patient Instructions (Addendum)
Keba Check orthostatics Teach how to use blood sugar meter-Father to check when he gets dizzy feelings.   Patient Check some basic labs today including diabetes test Check blood sugar on days you are dizzy and let me know what you are getting.   Caffeine in soda can be dehydrating. Let's cut to 1 soda per day for now and aim for at least 50 oz of water pre day.   Make sure to eat 3 meals a day (even if last meal of the day is something as simple as a bowl of cereal).   Advise supervised exercises -trading off with dad on rowing machine or perhaps stationary bike while playing video games, be more active and spend more time with that dog!   Blood pressure already trending up and don't want to have to start you on medications for this.   Start waking up at 3pm everyday for a week Then 2:30 pm for 1 week Then 2 pm for 1 week Then 1:30 pm for 1 week Then 1 pm for 1 week Then 12:30 pm for 1 week Then 12:00 PM for 1 week  Then 11:30 PM  See me in 6 weeks.

## 2014-04-03 ENCOUNTER — Telehealth: Payer: Self-pay

## 2014-04-03 NOTE — Telephone Encounter (Signed)
Called and spoke with pt's father and he is aware pt needs labs. Appt scheduled for 1.4.2015.

## 2014-04-03 NOTE — Telephone Encounter (Signed)
-----   Message from Shelva MajesticStephen O Hunter, MD sent at 04/02/2014  5:58 PM EST ----- This patient didn't get his labs before he left last week. Can you call him in please?  ----- Message -----    From: SYSTEM    Sent: 03/31/2014  12:04 AM      To: Shelva MajesticStephen O Hunter, MD

## 2014-04-07 ENCOUNTER — Other Ambulatory Visit: Payer: PPO

## 2014-04-07 ENCOUNTER — Other Ambulatory Visit: Payer: Medicare HMO

## 2014-04-07 LAB — COMPREHENSIVE METABOLIC PANEL
ALT: 45 U/L (ref 0–53)
AST: 29 U/L (ref 0–37)
Albumin: 4.2 g/dL (ref 3.5–5.2)
Alkaline Phosphatase: 79 U/L (ref 39–117)
BUN: 14 mg/dL (ref 6–23)
CO2: 26 mEq/L (ref 19–32)
Calcium: 9.2 mg/dL (ref 8.4–10.5)
Chloride: 106 mEq/L (ref 96–112)
Creatinine, Ser: 1 mg/dL (ref 0.4–1.5)
GFR: 88.06 mL/min (ref >60.00)
Glucose, Bld: 78 mg/dL (ref 70–99)
Potassium: 4 mEq/L (ref 3.5–5.1)
Sodium: 140 mEq/L (ref 135–145)
Total Bilirubin: 1 mg/dL (ref 0.2–1.2)
Total Protein: 7.2 g/dL (ref 6.0–8.3)

## 2014-04-07 LAB — CBC
HCT: 44.4 % (ref 39.0–52.0)
Hemoglobin: 14.7 g/dL (ref 13.0–17.0)
MCHC: 33.2 g/dL (ref 30.0–36.0)
MCV: 96 fl (ref 78.0–100.0)
Platelets: 369 10*3/uL (ref 150.0–400.0)
RBC: 4.63 Mil/uL (ref 4.22–5.81)
RDW: 12.7 % (ref 11.5–15.5)
WBC: 6.7 10*3/uL (ref 4.0–10.5)

## 2014-04-07 LAB — HEMOGLOBIN A1C: Hgb A1c MFr Bld: 5.2 % (ref 4.6–6.5)

## 2014-04-07 LAB — TSH: TSH: 1.38 u[IU]/mL (ref 0.35–4.50)

## 2014-05-07 ENCOUNTER — Ambulatory Visit: Payer: Medicare HMO | Admitting: Family Medicine

## 2014-05-08 ENCOUNTER — Telehealth: Payer: Self-pay | Admitting: Family Medicine

## 2014-05-08 NOTE — Telephone Encounter (Signed)
I called to speak with patient's father/Gregg Young about fax we received from them from patient's ins co. The fax was a copy of a letter they received from the ins co [employer] about Vimpat not being covered & possibly needing PA. I had called [employer] @ [phone]. I spoke with the representative & gave all pertinent patient information. She stated that after running patient's information that it was showing that patient will be covered for this med and they can proceed with refill requests as normal. I relayed all of this information to Gregg Young & told him to call me if they have any issues when trying to get a refill.

## 2014-05-09 ENCOUNTER — Encounter: Payer: Self-pay | Admitting: Neurology

## 2014-05-09 ENCOUNTER — Ambulatory Visit (INDEPENDENT_AMBULATORY_CARE_PROVIDER_SITE_OTHER): Payer: PPO | Admitting: Neurology

## 2014-05-09 VITALS — BP 110/80 | HR 101 | Ht 66.0 in | Wt 227.3 lb

## 2014-05-09 DIAGNOSIS — G40211 Localization-related (focal) (partial) symptomatic epilepsy and epileptic syndromes with complex partial seizures, intractable, with status epilepticus: Secondary | ICD-10-CM | POA: Diagnosis not present

## 2014-05-09 DIAGNOSIS — R42 Dizziness and giddiness: Secondary | ICD-10-CM | POA: Diagnosis not present

## 2014-05-09 MED ORDER — CLOBAZAM 10 MG PO TABS: ORAL_TABLET | ORAL | Status: DC

## 2014-05-09 NOTE — Patient Instructions (Signed)
1. Start Onfi 10mg : Take 1 tablet at bedtime for 1 week, then increase to 2 tablets at bedtime for 1 week, then increase to 3 tablets at bedtime and continue 2. Continue Vimpat, Keppra, Lamictal, and Lyrica for now 3. Call me in a month to update on your symptoms, we will plan to reduce Vimpat at that point 4. Continue healthy sleep and eating habits 5. Follow-up in 2 months

## 2014-05-12 ENCOUNTER — Encounter: Payer: Self-pay | Admitting: Neurology

## 2014-05-12 ENCOUNTER — Telehealth: Payer: Self-pay | Admitting: Neurology

## 2014-05-12 NOTE — Progress Notes (Signed)
NEUROLOGY FOLLOW UP OFFICE NOTE  JAISEAN MONTEFORTE [id number]  HISTORY OF PRESENT ILLNESS: I had the pleasure of seeing Maison Kestenbaum in follow-up in the neurology clinic on 05/09/2014.  The patient was last seen 4 months ago for intractable seizures and is again accompanied by his father who helps supplement the history today. Records and images were personally reviewed where available.  On his initial visit, his family reported that he was having "petit mals" several times a day that had stopped when he started having a different type of episode where he would speak gibberish and unable to control his limbs. A 48-hour EEG was done to classify these episodes. Baseline EEG was abnormal with multifocal discharges and right temporal slowing. He reported a fall during the EEG, no epileptiform changes were seen at that time. His father had reported that the falls and dizziness seemed to occur when he did not each much. He has seen his PCP and has been instructed to check glucose levels during these episodes. His father reports that he has not had any further similar episodes since December.   He however again started having the "petit mals" where his eyes would roll back with brief shaking. At one point he had 7 in one day. Jeshurun reports continued dizziness. He is on high doses of Lamictal and Vimpat. He denies any headaches, diplopia, focal numbness/tingling/weakness.   HPI: This is a 39 yo RH man with a history of seizures since age 26. He has no recollection of events, no prior warning, witnessed by his mother to have a generalized convulsion lasting 90 to 120 seconds. He was brought to [facility] then had another convulsion 2 days later. They recall trying different medications, Depakote caused liver dysfunction, he has failed Dilantin and Zonegran. He has been on Keppra, Lamictal, Lyrica, and most recently Vimpat. He had an EMU admission at [facility], records unavailable for review, per notes he stayed for  36 hours and had generalized seizures, multiple foci. They report two admissions for status epilepticus, one in September 2002 in the setting of weaning off Depakote, and another in January 2003. Records unavailable for review. His last GTC was around 5 years ago. He continued to have "petit mals" several times a day where his eyes would roll back, hands would shake for 30-45 seconds, if standing he would fall and had required sutures and staples in the past. He had been having the "petit mals" several times daily until 3 weeks ago when he started having a different type of episode and the petit mals "completely stopped." He was brought to the ER on 12/07/13 when his parents awoke to him dry having and speaking gibberish. He would be unable to speak, control his limbs, and cannot stand up without assistance. He can hear people around him but his jaw feels tight. The episodes can last for several hours. He has had 4 episodes in the past 3 weeks. The patient reports that they have been occurring only during the weekends, however his parents remind him he had one on Wednesday. He went to his neurologist's office on 09/10 where he had an episode while walking to the exam room where he became dizzy and fell. His father was able to catch him, and they reported his speech became affected. He was noted to be speaking in a whisper throughout the visit. He does note that he becomes more dizzy after taking his medications. AED levels were checked, and Keppra level was 85.2 and dose  was reduced from  BID to  BID. Lamictal level was 17.8, Vimpat level 7.3. He continues on Lamictal  TID, Lyrica  BID, Vimpat  TID.  The patient lives with his parents and brother. He is on disability and mostly stays at home playing video games. He graduated high school, no special ed classes. He endorses olfactory hallucinations but cannot describe it except saying they are the same all the time.   Epilepsy  Risk Factors: He had a skull fracture on the right side after a fall at 11 months of age. Otherwise he had a normal birth and early development. There is no history of febrile convulsions, CNS infections such as meningitis/encephalitis, neurosurgical procedures, or family history of seizures.  Prior AEDs: Depakote, Dilantin, Zonegran  EEGs: 48-hour EEG abnormal with multifocal discharges and right temporal slowing. Prior records note "generalized seizures, multiple foci"  MRI: I personally reviewed MRI brain with and without contrast done 12/26/2013 which did not show any acute intracranial abnormality, hippocampi symmetric without abnormal signal or enhancement.  PAST MEDICAL HISTORY: Past Medical History  Diagnosis Date  . Seizures     MEDICATIONS: Current Outpatient Prescriptions on File Prior to Visit  Medication Sig Dispense Refill  . lacosamide (VIMPAT) 200 MG TABS tablet Take 1 tablet every morning & 2 tablets at bedtime. 90 tablet 4  . lamoTRIgine (LAMICTAL) 200 MG tablet Take 1 tablet (200 mg total) by mouth 3 (three) times daily. 90 tablet 4  . levETIRAcetam (KEPPRA) 1000 MG tablet Take 2 & 1/2 tablets twice daily 150 tablet 4  . pregabalin (LYRICA) 225 MG capsule Take 1 capsule (225 mg total) by mouth 3 (three) times daily. 90 capsule 4   No current facility-administered medications on file prior to visit.    ALLERGIES: Allergies  Allergen Reactions  . Penicillins Rash    FAMILY HISTORY: Family History  Problem Relation Age of Onset  . Healthy Mother   . Healthy Father     SOCIAL HISTORY: History   Social History  . Marital Status: Single    Spouse Name: N/A    Number of Children: 0  . Years of Education: HS   Occupational History  . Not on file.   Social History Main Topics  . Smoking status: Former Games developer  . Smokeless tobacco: Never Used  . Alcohol Use: No  . Drug Use: No  . Sexual Activity: Not on file   Other Topics Concern  . Not on file    Social History Narrative   Patient is single and lives with his parents.   Patient has a high school education.   Patient is right-handed.   Patient does not have any children.   Patient is on disability.   Patient drinks three sodas daily.    REVIEW OF SYSTEMS: Constitutional: No fevers, chills, or sweats, no generalized fatigue, change in appetite Eyes: No visual changes, double vision, eye pain Ear, nose and throat: No hearing loss, ear pain, nasal congestion, sore throat Cardiovascular: No chest pain, palpitations Respiratory:  No shortness of breath at rest or with exertion, wheezes GastrointestinaI: No nausea, vomiting, diarrhea, abdominal pain, fecal incontinence Genitourinary:  No dysuria, urinary retention or frequency Musculoskeletal:  No neck pain, back pain Integumentary: No rash, pruritus, skin lesions Neurological: as above Psychiatric: No depression, insomnia, anxiety Endocrine: No palpitations, fatigue, diaphoresis, mood swings, change in appetite, change in weight, increased thirst Hematologic/Lymphatic:  No anemia, purpura, petechiae. Allergic/Immunologic: no itchy/runny eyes, nasal congestion, recent allergic reactions, rashes  PHYSICAL EXAM: Filed Vitals:   05/09/14 1443  BP: 110/80  Pulse: 101   General: No acute distress Head:  Normocephalic/atraumatic Neck: supple, no paraspinal tenderness, full range of motion Heart:  Regular rate and rhythm Lungs:  Clear to auscultation bilaterally Back: No paraspinal tenderness Skin/Extremities: No rash, no edema Neurological Exam: alert and oriented to person, place, and time. No aphasia or dysarthria. Fund of knowledge is appropriate.  Recent and remote memory are intact.  Attention and concentration are normal.    Able to name objects and repeat phrases. Cranial nerves: Pupils equal, round, reactive to light.  Fundoscopic exam unremarkable, no papilledema. Extraocular movements intact with no nystagmus. Visual  fields full. Facial sensation intact. No facial asymmetry. Tongue, uvula, palate midline.  Motor: Bulk and tone normal, muscle strength 5/5 throughout with no pronator drift.  Sensation to light touch intact.  No extinction to double simultaneous stimulation.  Deep tendon reflexes 2+ throughout, toes downgoing.  Finger to nose testing intact.  Gait narrow-based and steady, able to tandem walk adequately.  Romberg negative.  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 Seizures started at age 39. He continues to have multiple daily episodes of "petit mals" despite high doses of 4 AEDs. They were reporting new episodes of prolonged episodes of speech difficulties with incoordination and dizziness. MRI brain unremarkable. A fall captured on 48-hour EEG did not show electrographic change. No further similar episodes since December when he changed some lifestyle habits (sleep, eating pattern). He continues to feel dizzy, likely due to combination high dose Lamictal and Vimpat. We will plan to slowly taper off Vimpat, however he has a history of status epilepticus with medication taper in the past. He will start Onfi 10mg  qhs with uptitration over the next 4 weeks. When therapeutic, we will plan to slowly taper Vimpat. Side effects of Onfi were discussed. He will follow-up in 2 months.   Thank you for allowing me to participate in his care.  Please do not hesitate to call for any questions or concerns.  The duration of this appointment visit was 25 minutes of face-to-face time with the patient.  Greater than 50% of this time was spent in counseling, explanation of diagnosis, planning of further management, and coordination of care.   Patrcia DollyKaren Taylin Mans, M.D.   CC: Dr. Durene CalHunter

## 2014-05-12 NOTE — Telephone Encounter (Signed)
I called Rx into patient's pharmacy.  Patient's mother was notified.

## 2014-05-12 NOTE — Telephone Encounter (Signed)
Gregg NissenBranda, pt's mom called stating that the pharmacy did not receive the script for ONFI 10mg   Pharmacy: [facility] on [address]   C/b [phone]

## 2014-06-10 ENCOUNTER — Telehealth: Payer: Self-pay | Admitting: Neurology

## 2014-06-10 NOTE — Telephone Encounter (Signed)
Sent disability determination to Medical records  °

## 2014-06-16 ENCOUNTER — Telehealth: Payer: Self-pay | Admitting: Neurology

## 2014-06-16 NOTE — Telephone Encounter (Signed)
John, pt;s father called wanting to confirm that pt needs to come in after being on new meds. Please call pt to confirm. C/b [phone]

## 2014-06-16 NOTE — Telephone Encounter (Signed)
I spoke with Gregg RuizJohn he states that [name] has been doing well on the Onfi with no seizures. Is he to start tapering off the Vimpat or do you want to wait until you see him on 4/5?

## 2014-06-16 NOTE — Telephone Encounter (Signed)
I did verify that patient was taking medications as listed below. I gave new directions for Vimpat, will re-address at patient's follow-up visit.

## 2014-06-16 NOTE — Telephone Encounter (Signed)
We will start tapering down the Vimpat before I see him. Pls confirm he is on Onfi 30mg  qhs. He is currently on Vimpat 200mg  1 tab in AM, 2 tabs in PM. Start reducing Vimpat to 1 tab in AM, 1-1/2 tabs in PM x 1 week, then 1 tab in AM, 1 tab in PM. Continue on this until I see him and we will plan next steps on his visit in April. Thanks

## 2014-06-17 ENCOUNTER — Telehealth: Payer: Self-pay | Admitting: Neurology

## 2014-06-17 NOTE — Telephone Encounter (Signed)
I returned call. She was wanting to check to see if we had forwarded records that [employer] had requested. I explained to her that we wouldn't have received that request it would have went to our H.I.M dept. I gave her that number to call to see if the request was processed as I did see that the requests had been scanned into patient's chart in the media tab.

## 2014-06-17 NOTE — Telephone Encounter (Signed)
Steward DroneBrenda, pt's mother called wanting tos speak to a nurse regarding his social security. C/b [phone]

## 2014-07-08 ENCOUNTER — Ambulatory Visit (INDEPENDENT_AMBULATORY_CARE_PROVIDER_SITE_OTHER): Payer: PPO | Admitting: Neurology

## 2014-07-08 ENCOUNTER — Encounter: Payer: Self-pay | Admitting: Neurology

## 2014-07-08 VITALS — BP 104/72 | HR 87 | Resp 18 | Ht 66.0 in | Wt 229.0 lb

## 2014-07-08 DIAGNOSIS — G40211 Localization-related (focal) (partial) symptomatic epilepsy and epileptic syndromes with complex partial seizures, intractable, with status epilepticus: Secondary | ICD-10-CM

## 2014-07-08 DIAGNOSIS — R42 Dizziness and giddiness: Secondary | ICD-10-CM

## 2014-07-08 DIAGNOSIS — Z79899 Other long term (current) drug therapy: Secondary | ICD-10-CM | POA: Diagnosis not present

## 2014-07-08 NOTE — Patient Instructions (Signed)
1. Continue reducing Vimpat 200mg :  - continue 1 tablet twice a day until April 11, then - reduce to 1/2 tablet in AM, 1 tablet in PM until April 25, then - reduce to 1/2 tablet twice a day and continue on this until your follow-up  2. Continue all your other medications as instructed  Seizure Precautions: 1. If medication has been prescribed for you to prevent seizures, take it exactly as directed.  Do not stop taking the medicine without talking to your doctor first, even if you have not had a seizure in a long time.   2. Avoid activities in which a seizure would cause danger to yourself or to others.  Don't operate dangerous machinery, swim alone, or climb in high or dangerous places, such as on ladders, roofs, or girders.  Do not drive unless your doctor says you may.  3. If you have any warning that you may have a seizure, lay down in a safe place where you can't hurt yourself.    4.  No driving for 6 months from last seizure, as per [place] law.   Please refer to the following link on the Epilepsy Foundation of America's website for more information: http://www.epilepsyfoundation.org/answerplace/Social/driving/drivingu.cfm   5.  Maintain good sleep hygiene.  6.  Contact your doctor if you have any problems that may be related to the medicine you are taking.  7.  Call 911 and bring the patient back to the ED if:        A.  The seizure lasts longer than 5 minutes.       B.  The patient doesn't awaken shortly after the seizure  C.  The patient has new problems such as difficulty seeing, speaking or moving  D.  The patient was injured during the seizure  E.  The patient has a temperature over 102 F (39C)  F.  The patient vomited and now is having trouble breathing

## 2014-07-08 NOTE — Progress Notes (Signed)
NEUROLOGY FOLLOW UP OFFICE NOTE  Gregg FieldRussell A Young [id number]  HISTORY OF PRESENT ILLNESS: I had the pleasure of seeing Gregg ClevelandRussell Young in follow-up in the neurology clinic on 07/08/2014.  The patient was last seen 2 months ago for intractable seizures and is again accompanied by his father who helps supplement the history today. Since his last visit, he has started Onfi, currently on 30mg  qhs. His father reports a significant improvement in seizures, he has had only 2 seizures since his last visit, none since increasing Onfi to 30mg  qhs. He was also having a different type of episode where he would be speaking gibberish and be unable to control his limbs, he has not had any of these since December. He was also reporting dizziness on his last visit, and has reduced Vimpat dose from 500mg /day to 400mg /day. He reports that he definitely feels a lot better with the dizziness, only occurring every now and then. He denies any falls. No headaches, nausea, vomiting, focal numbness/tingling/weakness. He continues on Lamictal 200mg  TID, Lyrica 225mg  BID, and Keppra 2500mg  BID. His parents put his pills in a pillbox for him.  HPI: This is a 39 yo RH man with a history of seizures since age 39. He has no recollection of events, no prior warning, witnessed by his mother to have a generalized convulsion lasting 90 to 120 seconds. [facility] then had another convulsion 2 days later. They recall trying different medications, Depakote caused liver dysfunction, he has failed Dilantin and Zonegran. He has been on Keppra, Lamictal, Lyrica, and most recently Vimpat. [facility] records unavailable for review, per notes he stayed for 36 hours and had generalized seizures, multiple foci. They report two admissions for status epilepticus, one in September 2002 in the setting of weaning off Depakote, and another in January 2003. Records unavailable for review. His last GTC was around 5  years ago. He continued to have "petit mals" several times a day where his eyes would roll back, hands would shake for 30-45 seconds, if standing he would fall and had required sutures and staples in the past. He had been having the "petit mals" several times daily until 3 weeks ago when he started having a different type of episode and the petit mals "completely stopped." He was brought to the ER on 12/07/13 when his parents awoke to him dry having and speaking gibberish. He would be unable to speak, control his limbs, and cannot stand up without assistance. He can hear people around him but his jaw feels tight. The episodes can last for several hours. He has had 4 episodes in the past 3 weeks. The patient reports that they have been occurring only during the weekends, however his parents remind him he had one on Wednesday. He went to his neurologist's office on 09/10 where he had an episode while walking to the exam room where he became dizzy and fell. His father was able to catch him, and they reported his speech became affected. He was noted to be speaking in a whisper throughout the visit. He does note that he becomes more dizzy after taking his medications. Lamictal level was 17.8, Vimpat level 7.3.   The patient lives with his parents and brother. He is on disability and mostly stays at home playing video games. He graduated high school, no special ed classes. He endorses olfactory hallucinations but cannot describe it except saying they are the same all the time.  Epilepsy Risk Factors: He had a skull fracture on the right side after a fall at 47 months of age. Otherwise he had a normal birth and early development. There is no history of febrile convulsions, CNS infections such as meningitis/encephalitis, neurosurgical procedures, or family history of seizures.  Prior AEDs: Depakote, Dilantin, Zonegran  EEGs: 48-hour EEG abnormal with multifocal discharges and right temporal slowing. Prior  records note "generalized seizures, multiple foci"  MRI: I personally reviewed MRI brain with and without contrast done 12/26/2013 which did not show any acute intracranial abnormality, hippocampi symmetric without abnormal signal or enhancement.  PAST MEDICAL HISTORY: Past Medical History  Diagnosis Date  . Seizures     MEDICATIONS: Current Outpatient Prescriptions on File Prior to Visit  Medication Sig Dispense Refill  . cloBAZam (ONFI) 10 MG tablet Take 1 tablet at bedtime for 1 week, then increase to 2 tablets at bedtime for 1 week, then increase to 3 tablets at bedtime (Patient taking differently: Take 3 tablets at bedtime) 90 tablet 3  . lacosamide (VIMPAT) 200 MG TABS tablet Take 1 tablet every morning & 2 tablets at bedtime. (Patient taking differently: Take 1 tablet twice a day) 90 tablet 4  . lamoTRIgine (LAMICTAL) 200 MG tablet Take 1 tablet (200 mg total) by mouth 3 (three) times daily. 90 tablet 4  . levETIRAcetam (KEPPRA) 1000 MG tablet Take 2 & 1/2 tablets twice daily 150 tablet 4  . pregabalin (LYRICA) 225 MG capsule Take 1 capsule (225 mg total) by mouth 3 (three) times daily. 90 capsule 4   No current facility-administered medications on file prior to visit.    ALLERGIES: Allergies  Allergen Reactions  . Penicillins Rash    FAMILY HISTORY: Family History  Problem Relation Age of Onset  . Healthy Mother   . Healthy Father     SOCIAL HISTORY: History   Social History  . Marital Status: Single    Spouse Name: N/A  . Number of Children: 0  . Years of Education: HS   Occupational History  . Not on file.   Social History Main Topics  . Smoking status: Former Games developer  . Smokeless tobacco: Never Used  . Alcohol Use: No  . Drug Use: No  . Sexual Activity: Not on file   Other Topics Concern  . Not on file   Social History Narrative   Patient is single and lives with his parents.   Patient has a high school education.   Patient is right-handed.    Patient does not have any children.   Patient is on disability.   Patient drinks three sodas daily.    REVIEW OF SYSTEMS: Constitutional: No fevers, chills, or sweats, no generalized fatigue, change in appetite Eyes: No visual changes, double vision, eye pain Ear, nose and throat: No hearing loss, ear pain, nasal congestion, sore throat Cardiovascular: No chest pain, palpitations Respiratory:  No shortness of breath at rest or with exertion, wheezes GastrointestinaI: No nausea, vomiting, diarrhea, abdominal pain, fecal incontinence Genitourinary:  No dysuria, urinary retention or frequency Musculoskeletal:  No neck pain, back pain Integumentary: No rash, pruritus, skin lesions Neurological: as above Psychiatric: No depression, insomnia, anxiety Endocrine: No palpitations, fatigue, diaphoresis, mood swings, change in appetite, change in weight, increased thirst Hematologic/Lymphatic:  No anemia, purpura, petechiae. Allergic/Immunologic: no itchy/runny eyes, nasal congestion, recent allergic reactions, rashes  PHYSICAL EXAM: Filed Vitals:   07/08/14 1415  BP: 104/72  Pulse: 87  Resp: 18   General: No acute distress Head:  Normocephalic/atraumatic Neck: supple, no paraspinal tenderness, full range of motion Heart:  Regular rate and rhythm Lungs:  Clear to auscultation bilaterally Back: No paraspinal tenderness Skin/Extremities: No rash, no edema Neurological Exam: alert and oriented to person, place, and time. No aphasia or dysarthria. Fund of knowledge is appropriate.  Recent and remote memory are intact. 3/3 delayed recall. Attention and concentration are normal.    Able to name objects and repeat phrases. Cranial nerves: Pupils equal, round, reactive to light.  Fundoscopic exam unremarkable, no papilledema. Extraocular movements intact with no nystagmus. Visual fields full. Facial sensation intact. No facial asymmetry. Tongue, uvula, palate midline.  Motor: Bulk and tone normal,  muscle strength 5/5 throughout with no pronator drift.  Sensation to light touch intact.  No extinction to double simultaneous stimulation.  Deep tendon reflexes 2+ throughout, toes downgoing.  Finger to nose testing intact.  Gait narrow-based and steady, able to tandem walk adequately.  Romberg negative.  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 Seizures started at age 1. He reported continued multiple daily episodes of "petit mals" despite high doses of 4 AEDs. None since starting Onfi  qhs. They were reporting new episodes of prolonged episodes of speech difficulties with incoordination and dizziness, a fall captured on 48-hour EEG did not show electrographic change. No further similar episodes since December when he changed some lifestyle habits (sleep, eating pattern). The dizziness has improved with lower dose Vimpat. He is currently on 5 AEDs, we will continue to streamline his medications and plan to taper off Vimpat. He has a history of status epilepticus with medication taper in the past, we will do a very slow taper over the next several weeks. Risks of breakthrough seizures with any medication adjustment was discussed with the patient and his father, they know to call for any problems during the taper. He will follow-up in 6 weeks.   Thank you for allowing me to participate in his care.  Please do not hesitate to call for any questions or concerns.  The duration of this appointment visit was 15 minutes of face-to-face time with the patient.  Greater than 50% of this time was spent in counseling, explanation of diagnosis, planning of further management, and coordination of care.   Patrcia Dolly, M.D.   CC: Dr. Durene Cal

## 2014-07-24 DIAGNOSIS — Z0289 Encounter for other administrative examinations: Secondary | ICD-10-CM

## 2014-08-05 ENCOUNTER — Other Ambulatory Visit: Payer: Self-pay | Admitting: Neurology

## 2014-08-11 ENCOUNTER — Other Ambulatory Visit: Payer: Self-pay | Admitting: Neurology

## 2014-08-19 ENCOUNTER — Encounter: Payer: Self-pay | Admitting: Neurology

## 2014-08-19 ENCOUNTER — Ambulatory Visit (INDEPENDENT_AMBULATORY_CARE_PROVIDER_SITE_OTHER): Payer: PPO | Admitting: Neurology

## 2014-08-19 VITALS — BP 120/80 | HR 89 | Resp 16 | Ht 66.0 in | Wt 237.0 lb

## 2014-08-19 DIAGNOSIS — G40211 Localization-related (focal) (partial) symptomatic epilepsy and epileptic syndromes with complex partial seizures, intractable, with status epilepticus: Secondary | ICD-10-CM | POA: Diagnosis not present

## 2014-08-19 NOTE — Progress Notes (Signed)
NEUROLOGY FOLLOW UP OFFICE NOTE  Gregg Young [id number]  HISTORY OF PRESENT ILLNESS: I had the pleasure of seeing Gregg Young in follow-up in the neurology clinic on 08/19/2014.  The patient was last seen 6 weeks ago for intractable seizures and is again accompanied by his father who helps supplement the history today.  Records and images were personally reviewed where available.  Since his last visit, he has tapered down Vimpat from 400mg /day to 200mg /day. He reports feeling 100% better with the dizziness, he very seldom gets dizzy now and it does not last long. He started Onfi, currently on 30mg  qhs with no side effects. His family reports more blanking out episodes. His mother noticed a cluster of 4, his father has seen 2 or 3 in the past 6 weeks, last episode was yesterday evening. The patient is unaware of these. He denies any further episodes of falls. He has occasional hand tingling around once a month, no focal weakness. No headaches or vision changes. He continues on Lamictal 200mg  TID, Lyrica 225mg  BID, and Keppra 2500mg  BID. His parents put his pills in a pillbox for him.  HPI: This is a 39 yo RH man with a history of seizures since age 39. He has no recollection of events, no prior warning, witnessed by his mother to have a generalized convulsion lasting 90 to 120 seconds. [facility] then had another convulsion 2 days later. They recall trying different medications, Depakote caused liver dysfunction, he has failed Dilantin and Zonegran. He has been on Keppra, Lamictal, Lyrica, and most recently Vimpat. He had an [facility], records unavailable for review, per notes he stayed for 36 hours and had generalized seizures, multiple foci. They report two admissions for status epilepticus, one in September 2002 in the setting of weaning off Depakote, and another in January 2003. Records unavailable for review. His last GTC was around 5 years ago. He continued to  have "petit mals" several times a day where his eyes would roll back, hands would shake for 30-45 seconds, if standing he would fall and had required sutures and staples in the past. He had been having the "petit mals" several times daily until 3 weeks ago when he started having a different type of episode and the petit mals "completely stopped." He was brought to the ER on 12/07/13 when his parents awoke to him dry having and speaking gibberish. He would be unable to speak, control his limbs, and cannot stand up without assistance. He can hear people around him but his jaw feels tight. The episodes can last for several hours. He has had 4 episodes in the past 3 weeks. The patient reports that they have been occurring only during the weekends, however his parents remind him he had one on Wednesday. He went to his neurologist's office on 09/10 where he had an episode while walking to the exam room where he became dizzy and fell. His father was able to catch him, and they reported his speech became affected. He was noted to be speaking in a whisper throughout the visit. He does note that he becomes more dizzy after taking his medications. Lamictal level was 17.8, Vimpat level 7.3.   The patient lives with his parents and brother. He is on disability and mostly stays at home playing video games. He graduated high school, no special ed classes. He endorses olfactory hallucinations but cannot describe it except saying they are the same all the time.  Epilepsy Risk Factors: He had a skull fracture on the right side after a fall at 14 months of age. Otherwise he had a normal birth and early development. There is no history of febrile convulsions, CNS infections such as meningitis/encephalitis, neurosurgical procedures, or family history of seizures.  Prior AEDs: Depakote, Dilantin, Zonegran  EEGs: 48-hour EEG abnormal with multifocal discharges and right temporal slowing. Prior records note "generalized  seizures, multiple foci"  MRI: I personally reviewed MRI brain with and without contrast done 12/26/2013 which did not show any acute intracranial abnormality, hippocampi symmetric without abnormal signal or enhancement.  PAST MEDICAL HISTORY: Past Medical History  Diagnosis Date  . Seizures     MEDICATIONS: Current Outpatient Prescriptions on File Prior to Visit  Medication Sig Dispense Refill  . cloBAZam (ONFI) 10 MG tablet Take 3 tablets at bedtime 90 tablet 5  . lacosamide (VIMPAT) 200 MG TABS tablet Take 1 tablet every morning & 2 tablets at bedtime. (Patient taking differently: Take 1/2 tablet twice daily) 90 tablet 4  . lamoTRIgine (LAMICTAL) 200 MG tablet Take 1 tablet (200 mg total) by mouth 3 (three) times daily. 90 tablet 4  . levETIRAcetam (KEPPRA) 1000 MG tablet Take 2 & 1/2 tablets twice daily 150 tablet 4  . pregabalin (LYRICA) 225 MG capsule Take 1 capsule (225 mg total) by mouth 3 (three) times daily. 90 capsule 4   No current facility-administered medications on file prior to visit.    ALLERGIES: Allergies  Allergen Reactions  . Penicillins Rash    FAMILY HISTORY: Family History  Problem Relation Age of Onset  . Healthy Mother   . Healthy Father     SOCIAL HISTORY: History   Social History  . Marital Status: Single    Spouse Name: N/A  . Number of Children: 0  . Years of Education: HS   Occupational History  . Not on file.   Social History Main Topics  . Smoking status: Former Games developer  . Smokeless tobacco: Never Used  . Alcohol Use: No  . Drug Use: No  . Sexual Activity: Not on file   Other Topics Concern  . Not on file   Social History Narrative   Patient is single and lives with his parents.   Patient has a high school education.   Patient is right-handed.   Patient does not have any children.   Patient is on disability.   Patient drinks three sodas daily.    REVIEW OF SYSTEMS: Constitutional: No fevers, chills, or sweats, no  generalized fatigue, change in appetite Eyes: No visual changes, double vision, eye pain Ear, nose and throat: No hearing loss, ear pain, nasal congestion, sore throat Cardiovascular: No chest pain, palpitations Respiratory:  No shortness of breath at rest or with exertion, wheezes GastrointestinaI: No nausea, vomiting, diarrhea, abdominal pain, fecal incontinence Genitourinary:  No dysuria, urinary retention or frequency Musculoskeletal:  No neck pain, back pain Integumentary: No rash, pruritus, skin lesions Neurological: as above Psychiatric: No depression, insomnia, anxiety Endocrine: No palpitations, fatigue, diaphoresis, mood swings, change in appetite, change in weight, increased thirst Hematologic/Lymphatic:  No anemia, purpura, petechiae. Allergic/Immunologic: no itchy/runny eyes, nasal congestion, recent allergic reactions, rashes  PHYSICAL EXAM: Filed Vitals:   08/19/14 1344  BP: 120/80  Pulse: 89  Resp: 16   General: No acute distress Head:  Normocephalic/atraumatic Neck: supple, no paraspinal tenderness, full range of motion Heart:  Regular rate and rhythm Lungs:  Clear to auscultation bilaterally Back: No paraspinal tenderness Skin/Extremities: No  rash, no edema Neurological Exam: alert and oriented to person, place, and time. No aphasia or dysarthria. Fund of knowledge is appropriate.  Recent and remote memory are intact. 3/3 delayed recall.  Attention and concentration are normal.    Able to name objects and repeat phrases. Cranial nerves: Pupils equal, round, reactive to light.  Fundoscopic exam unremarkable, no papilledema. Extraocular movements intact with no nystagmus. Visual fields full. Facial sensation intact. No facial asymmetry. Tongue, uvula, palate midline.  Motor: Bulk and tone normal, muscle strength 5/5 throughout with no pronator drift.  Sensation to light touch intact.  No extinction to double simultaneous stimulation.  Deep tendon reflexes 2+ throughout,  toes downgoing.  Finger to nose testing intact.  Gait narrow-based and steady, able to tandem walk adequately.  Romberg negative.  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 Seizures sta42rted at age 39. He reported continued multiple daily episodes of "petit mals" despite high doses of 4 AEDs. Initially none since starting Onfi 30mg  qhs, however since Vimpat was reduced in half, his family has noticed these again. He will increase Onfi to 10mg  in AM, 30mg  in PM. Continue with slow taper off Vimpat to streamline medications. Continue current doses of Keppra, Lamictal, Lyrica. He has a history of status epilepticus with medication taper in the past, continue to closely monitor with medication adjustments. He will follow-up in 2 months and knows to call our office for any changes or significant increase in seizures.  Thank you for allowing me to participate in his care.  Please do not hesitate to call for any questions or concerns.  The duration of this appointment visit was 15 minutes of face-to-face time with the patient.  Greater than 50% of this time was spent in counseling, explanation of diagnosis, planning of further management, and coordination of care.   Patrcia DollyKaren Hulen Mandler, M.D.   CC: Dr. Durene CalHunter

## 2014-08-19 NOTE — Patient Instructions (Addendum)
1. Continue reduction of Vimpat: Take Vimpat 50mg  tablets 1 tablet twice a day for 1 week, then 1 tablet daily for 1 week, then 1 tablet every other day for 1 week, then stop 2. Increase Onfi 10mg : Take 1 tablet in AM, 3 tablets in PM 3. Continue Keppra, Lamictal, Lyrica 4. Call our office for any significant increase in seizures  Seizure Precautions: 1. If medication has been prescribed for you to prevent seizures, take it exactly as directed.  Do not stop taking the medicine without talking to your doctor first, even if you have not had a seizure in a long time.   2. Avoid activities in which a seizure would cause danger to yourself or to others.  Don't operate dangerous machinery, swim alone, or climb in high or dangerous places, such as on ladders, roofs, or girders.  Do not drive unless your doctor says you may.  3. If you have any warning that you may have a seizure, lay down in a safe place where you can't hurt yourself.    4.  No driving for 6 months from last seizure, as per [facility] [place].   Please refer to the following link on the Epile[employer] Foundation of America's website for more information: http://www.epilepsyfoundation.org/answerplace/Social/driving/drivingu.cfm   5.  Maintain good sleep hygiene.  6.  Contact your doctor if you have any problems that may be related to the medicine you are taking.  7.  Call 911 and bring the patient back to the ED if:        A.  The seizure lasts longer than 5 minutes.       B.  The patient doesn't awaken shortly after the seizure  C.  The patient has new problems such as difficulty seeing, speaking or moving  D.  The patient was injured during the seizure  E.  The patient has a temperature over 102 F (39C)  F.  The patient vomited and now is having trouble breathing

## 2014-09-02 ENCOUNTER — Telehealth: Payer: Self-pay | Admitting: Neurology

## 2014-09-02 NOTE — Telephone Encounter (Signed)
Pt mother Steward DroneBrenda called and states that pt is having a lot of seizures and has fallen 3x in the last 2 days with the seizures. Please call patient father at [phone]

## 2014-09-02 NOTE — Telephone Encounter (Signed)
I spoke with patient's father, last seizure was yesterday. I did let him know that Dr. Karel JarvisAquino was out of the office until tomorrow & that I would speak with one of the other doctors about for further advisement. He stated that he didn't want me to do that & they would wait until tomorrow for Dr. Karel JarvisAquino to return to the office. Will forward this message to her.

## 2014-09-03 NOTE — Telephone Encounter (Signed)
Pt called back this morning and wanted to change the call back number to [phone]/Dawn

## 2014-09-03 NOTE — Telephone Encounter (Signed)
Has been falling since this weekend, right after he got up. Family would just hear him fall. Walking to bedroom and fell in hallway, fell standing in hallway. No loss of consciousness, no confusion but did not know why he was on the ground. He is on the Vimpat taper of every other day with plans to stop this week. Discussed increasing Onfi to 20mg  in AM, 30mg  in PM. Father to call back in a week, we may need to add back on low dose Vimpat if symptoms continue. Father knows to call if symptoms continue to worsen.

## 2014-09-03 NOTE — Telephone Encounter (Signed)
Please review

## 2014-09-22 ENCOUNTER — Telehealth: Payer: Self-pay | Admitting: Neurology

## 2014-09-22 ENCOUNTER — Other Ambulatory Visit: Payer: Self-pay | Admitting: Neurology

## 2014-09-22 MED ORDER — CLOBAZAM 10 MG PO TABS: ORAL_TABLET | ORAL | Status: DC

## 2014-09-22 NOTE — Telephone Encounter (Signed)
pts mother Steward Drone called in regards to his medication Onfi/Dawn CB#[phone]

## 2014-09-22 NOTE — Telephone Encounter (Signed)
I spoke with patient's mother/Brenda. Clarification states that patient's Onfi directions have changed since last ov. Corrected Rx called in to patient's pharmacy.

## 2014-10-02 ENCOUNTER — Telehealth: Payer: Self-pay | Admitting: *Deleted

## 2014-10-02 MED ORDER — LACOSAMIDE 50 MG PO TABS: 50.0000 mg | ORAL_TABLET | Freq: Two times a day (BID) | ORAL | Status: DC

## 2014-10-02 NOTE — Addendum Note (Signed)
Addended by: Franciso BendMCNEIL, Jnyah Brazee M on: 10/02/2014 11:53 AM   Modules accepted: Orders

## 2014-10-02 NOTE — Telephone Encounter (Signed)
I spoke with patient's mother/Brenda about restarting the Vimpat. They don't have any 100 or 50 mg's at home, will call in a Rx to his pharmacy.

## 2014-10-02 NOTE — Telephone Encounter (Signed)
Please advise 

## 2014-10-02 NOTE — Telephone Encounter (Signed)
Pls let father know it looks like he will need to be on a low dose of Vimpat. Restart Vimpat  BID. Continue on current dose of Onfi and other meds. Thanks

## 2014-10-02 NOTE — Telephone Encounter (Signed)
Patient has had a medication change d/c Vimpat and increase in onfi patient is having multiple seizures between 5 and 8 in the evening Call back number [phone]

## 2014-10-20 ENCOUNTER — Encounter: Payer: Self-pay | Admitting: Neurology

## 2014-10-20 ENCOUNTER — Other Ambulatory Visit (INDEPENDENT_AMBULATORY_CARE_PROVIDER_SITE_OTHER): Payer: PPO

## 2014-10-20 ENCOUNTER — Ambulatory Visit (INDEPENDENT_AMBULATORY_CARE_PROVIDER_SITE_OTHER): Payer: PPO | Admitting: Neurology

## 2014-10-20 VITALS — BP 140/90 | HR 103 | Resp 16 | Ht 66.0 in | Wt 232.0 lb

## 2014-10-20 DIAGNOSIS — G40211 Localization-related (focal) (partial) symptomatic epilepsy and epileptic syndromes with complex partial seizures, intractable, with status epilepticus: Secondary | ICD-10-CM | POA: Diagnosis not present

## 2014-10-20 LAB — CBC
HCT: 43.8 % (ref 39.0–52.0)
Hemoglobin: 15.1 g/dL (ref 13.0–17.0)
MCHC: 34.6 g/dL (ref 30.0–36.0)
MCV: 92.4 fl (ref 78.0–100.0)
Platelets: 349 10*3/uL (ref 150.0–400.0)
RBC: 4.74 Mil/uL (ref 4.22–5.81)
RDW: 12.6 % (ref 11.5–15.5)
WBC: 6.9 10*3/uL (ref 4.0–10.5)

## 2014-10-20 LAB — COMPREHENSIVE METABOLIC PANEL
ALT: 27 U/L (ref 0–53)
AST: 17 U/L (ref 0–37)
Albumin: 4.4 g/dL (ref 3.5–5.2)
Alkaline Phosphatase: 86 U/L (ref 39–117)
BUN: 12 mg/dL (ref 6–23)
CO2: 30 mEq/L (ref 19–32)
Calcium: 9.3 mg/dL (ref 8.4–10.5)
Chloride: 104 mEq/L (ref 96–112)
Creatinine, Ser: 1.07 mg/dL (ref 0.40–1.50)
GFR: 81.22 mL/min (ref >60.00)
Glucose, Bld: 128 mg/dL — ABNORMAL HIGH (ref 70–99)
Potassium: 3.7 mEq/L (ref 3.5–5.1)
Sodium: 141 mEq/L (ref 135–145)
Total Bilirubin: 0.5 mg/dL (ref 0.2–1.2)
Total Protein: 7.3 g/dL (ref 6.0–8.3)

## 2014-10-20 MED ORDER — LACOSAMIDE 50 MG PO TABS: ORAL_TABLET | ORAL | Status: DC

## 2014-10-20 MED ORDER — CLOBAZAM 10 MG PO TABS: ORAL_TABLET | ORAL | Status: DC

## 2014-10-20 NOTE — Patient Instructions (Addendum)
1. Increase Vimpat 50mg : Take 1 tablet in AM, 2 tablets in PM 2. Reduce Onfi 10mg : Take 2 tablets twice a day 3. Continue Lamictal 200mg  three times a day 4. Continue Keppra 1000mg : Take 2-1/2 tablets twice a day 5. Continue Lyrica 225mg  three times a day 6. Bloodwork for CBC, CMP 7. Follow-up in 2 months  Seizure Precautions: 1. If medication has been prescribed for you to prevent seizures, take it exactly as directed.  Do not stop taking the medicine without talking to your doctor first, even if you have not had a seizure in a long time.   2. Avoid activities in which a seizure would cause danger to yourself or to others.  Don't operate dangerous machinery, swim alone, or climb in high or dangerous places, such as on ladders, roofs, or girders.  Do not drive unless your doctor says you may.  3. If you have any warning that you may have a seizure, lay down in a safe place where you can't hurt yourself.    4.  No driving for 6 months from last seizure, as per [facility] state law.   Please refer to the following link on the Epilepsy Foundation of America's website for more information: http://www.epilepsyfoundation.org/answerplace/Social/driving/drivingu.cfm   5.  Maintain good sleep hygiene.  6.  Contact your doctor if you have any problems that may be related to the medicine you are taking.  7.  Call 911 and bring the patient back to the ED if:        A.  The seizure lasts longer than 5 minutes.       B.  The patient doesn't awaken shortly after the seizure  C.  The patient has new problems such as difficulty seeing, speaking or moving  D.  The patient was injured during the seizure  E.  The patient has a temperature over 102 F (39C)  F.  The patient vomited and now is having trouble breathing

## 2014-10-20 NOTE — Progress Notes (Signed)
NEUROLOGY FOLLOW UP OFFICE NOTE  Gregg FieldRussell A Young [id number]  HISTORY OF PRESENT ILLNESS: I had the pleasure of seeing Gregg Young in follow-up in the neurology clinic on 10/20/2014.  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 He is again accompanied by his father who helps supplement the history today. on his last visit, he was tapering down Vimpat due to dizziness. Dizziness had significantly improved, however they reported an increase in seizures with more blanking out episodes. Onfi dose was increased to 40mg  qhs. He continued to taper off Vimpat, however his parents called 2 weeks ago to report more frequent falls and continued blank staring with multiple seizures between 5 and 8pm. Onfi dose increased to 50mg /day. He was restarted on low dose Vimpat 50mg  BID. He continues on Lamictal 200mg  TID, Lyrica 225mg  BID, and Keppra 2500mg  BID.  He reports that dizziness is not a problem anymore. He reports he has had only 2 falls since his last visit. He had a seizure last night and fell, and had another one this morning. His father feels his speech is slurred. His eyes are bloodshot today. He denies feeling tired, no recent illness.   HPI: This is a 39 yo RH man with a history of seizures since age 39. He has no recollection of events, no prior warning, witnessed by his mother to have a generalized convulsion lasting 90 to 120 seconds. [facility] was [facility] then had another convulsion 2 days later. They recall trying different medications, Depakote caused liver dysfunction, he has failed Dilantin and Zonegran. He has been on Keppra, Lamictal, Lyrica, and most recently Vimpat. [facility] records unavailable for review, per notes he stayed for 36 hours and had generalized seizures, multiple foci. They report two admissions for status epilepticus, one in September 2002 in the setting of weaning off Depakote, and another in January 2003.  Records unavailable for review. His last GTC was around 5 years ago. He continued to have "petit mals" several times a day where his eyes would roll back, hands would shake for 30-45 seconds, if standing he would fall and had required sutures and staples in the past. He had been having the "petit mals" several times daily until 3 weeks ago when he started having a different type of episode and the petit mals "completely stopped." He was brought to the ER on 12/07/13 when his parents awoke to him dry having and speaking gibberish. He would be unable to speak, control his limbs, and cannot stand up without assistance. He can hear people around him but his jaw feels tight. The episodes can last for several hours. He has had 4 episodes in the past 3 weeks. The patient reports that they have been occurring only during the weekends, however his parents remind him he had one on Wednesday. He went to his neurologist's office on 09/10 where he had an episode while walking to the exam room where he became dizzy and fell. His father was able to catch him, and they reported his speech became affected. He was noted to be speaking in a whisper throughout the visit. He does note that he becomes more dizzy after taking his medications. Lamictal level was 17.8, Vimpat level 7.3.   The patient lives with his parents and brother. He is on disability and mostly stays at home playing video games. He graduated high school, no special ed classes. He endorses olfactory hallucinations but cannot describe  it except saying they are the same all the time.   Epilepsy Risk Factors: He had a skull fracture on the right side after a fall at 20 months of age. Otherwise he had a normal birth and early development. There is no history of febrile convulsions, CNS infections such as meningitis/encephalitis, neurosurgical procedures, or family history of seizures.  Prior AEDs: Depakote, Dilantin, Zonegran  EEGs: 48-hour EEG abnormal with  multifocal discharges and right temporal slowing. Prior records note "generalized seizures, multiple foci"  MRI: I personally reviewed MRI brain with and without contrast done 12/26/2013 which did not show any acute intracranial abnormality, hippocampi symmetric without abnormal signal or enhancement.  PAST MEDICAL HISTORY: Past Medical History  Diagnosis Date  . Seizures     MEDICATIONS: Current Outpatient Prescriptions on File Prior to Visit  Medication Sig Dispense Refill  . cloBAZam (ONFI) 10 MG tablet Take 2 tablets in the morning, take 3 tablets in the evening. 150 tablet 5  . lacosamide (VIMPAT) 50 MG TABS tablet Take 1 tablet (50 mg total) by mouth 2 (two) times daily. 60 tablet 4  . lamoTRIgine (LAMICTAL) 200 MG tablet Take 1 tablet (200 mg total) by mouth 3 (three) times daily. 90 tablet 4  . levETIRAcetam (KEPPRA) 1000 MG tablet Take 2 & 1/2 tablets twice daily 150 tablet 4  . pregabalin (LYRICA) 225 MG capsule Take 1 capsule (225 mg total) by mouth 3 (three) times daily. 90 capsule 4   No current facility-administered medications on file prior to visit.    ALLERGIES: Allergies  Allergen Reactions  . Penicillins Rash    FAMILY HISTORY: Family History  Problem Relation Age of Onset  . Healthy Mother   . Healthy Father     SOCIAL HISTORY: History   Social History  . Marital Status: Single    Spouse Name: N/A  . Number of Children: 0  . Years of Education: HS   Occupational History  . Not on file.   Social History Main Topics  . Smoking status: Former Games developer  . Smokeless tobacco: Never Used  . Alcohol Use: No  . Drug Use: No  . Sexual Activity: Not on file   Other Topics Concern  . Not on file   Social History Narrative   Patient is single and lives with his parents.   Patient has a high school education.   Patient is right-handed.   Patient does not have any children.   Patient is on disability.   Patient drinks three sodas daily.    REVIEW  OF SYSTEMS: Constitutional: No fevers, chills, or sweats, no generalized fatigue, change in appetite Eyes: No visual changes, double vision, eye pain Ear, nose and throat: No hearing loss, ear pain, nasal congestion, sore throat Cardiovascular: No chest pain, palpitations Respiratory:  No shortness of breath at rest or with exertion, wheezes GastrointestinaI: No nausea, vomiting, diarrhea, abdominal pain, fecal incontinence Genitourinary:  No dysuria, urinary retention or frequency Musculoskeletal:  No neck pain, back pain Integumentary: No rash, pruritus, skin lesions Neurological: as above Psychiatric: No depression, insomnia, anxiety Endocrine: No palpitations, fatigue, diaphoresis, mood swings, change in appetite, change in weight, increased thirst Hematologic/Lymphatic:  No anemia, purpura, petechiae. Allergic/Immunologic: no itchy/runny eyes, nasal congestion, recent allergic reactions, rashes  PHYSICAL EXAM: Filed Vitals:   10/20/14 1529  BP: 140/90  Pulse: 103  Resp: 16   General: No acute distress Head:  Normocephalic/atraumatic Neck: supple, no paraspinal tenderness, full range of motion Heart:  Regular rate and  rhythm Lungs:  Clear to auscultation bilaterally Back: No paraspinal tenderness Skin/Extremities: No rash, no edema Neurological Exam: alert and oriented to person, place, and time. No aphasia or dysarthria. Fund of knowledge is appropriate.  Recent and remote memory are intact. 3/3 delayed recall. Attention and concentration are normal.    Able to name objects and repeat phrases but with mild difficulty. Cranial nerves: Pupils equal, round, reactive to light.  Fundoscopic exam unremarkable, no papilledema. Extraocular movements intact with no nystagmus. Visual fields full. Facial sensation intact. No facial asymmetry. Tongue, uvula, palate midline.  Motor: Bulk and tone normal, muscle strength 5/5 throughout with no pronator drift.  Sensation to light touch intact.   No extinction to double simultaneous stimulation.  Deep tendon reflexes 2+ throughout, toes downgoing.  Finger to nose testing intact.  Gait narrow-based and steady, able to tandem walk adequately.  Romberg negative.  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 Seizures started at age 67. He reported continued multiple daily episodes of "petit mals" despite high doses of 4 AEDs. Initially none since starting Onfi 30mg  qhs, however since Vimpat was reduced, his family continues to report multiple seizures mostly at night. Seizures continued despite increasing Onfi dose. Vimpat was restarted, he is now on 50mg  BID with continued seizures, he had a fall last night. He is noted to be drowsy today and speech slightly slurred per father. He will reduce Onfi to 20mg  BID, and increase Vimpat to 50mg  in AM, 100mg  in PM. We discussed that he may need to be on Vimpat as this was helping with the petit mal seizures. We will have to continue to adjust dose to find a happy medium. I discussed the option of VNS with them today, information was given. Continue current doses of Keppra, Lamictal, Lyrica. Safety labs with CBC, CMP will be ordered. He has a history of status epilepticus with medication taper in the past, continue to closely monitor with medication adjustments. He will follow-up in 2 months and knows to call our office for any changes or significant increase in seizures.  Thank you for allowing me to participate in his care.  Please do not hesitate to call for any questions or concerns.  The duration of this appointment visit was 26 minutes of face-to-face time with the patient.  Greater than 50% of this time was spent in counseling, explanation of diagnosis, planning of further management, and coordination of care.   Patrcia Dolly, M.D.   CC: Dr. Durene Cal

## 2014-10-21 ENCOUNTER — Telehealth: Payer: Self-pay | Admitting: Family Medicine

## 2014-10-21 NOTE — Telephone Encounter (Signed)
-----   Message from Van ClinesKaren M Aquino, MD sent at 10/21/2014  8:22 AM EDT ----- Pls let him/father know that bloodwork normal. Kidney and liver function look good. Thanks

## 2014-10-21 NOTE — Telephone Encounter (Signed)
Spoke with patient's dad/John and notified him of results.

## 2014-10-25 ENCOUNTER — Other Ambulatory Visit: Payer: Self-pay | Admitting: Neurology

## 2014-12-22 ENCOUNTER — Ambulatory Visit: Payer: PPO | Admitting: Neurology

## 2014-12-31 ENCOUNTER — Ambulatory Visit: Payer: PPO | Admitting: Neurology

## 2015-01-13 ENCOUNTER — Ambulatory Visit (INDEPENDENT_AMBULATORY_CARE_PROVIDER_SITE_OTHER): Payer: PPO | Admitting: Neurology

## 2015-01-13 ENCOUNTER — Encounter: Payer: Self-pay | Admitting: Neurology

## 2015-01-13 VITALS — BP 130/82 | HR 96 | Resp 16 | Ht 66.0 in | Wt 234.0 lb

## 2015-01-13 DIAGNOSIS — G40211 Localization-related (focal) (partial) symptomatic epilepsy and epileptic syndromes with complex partial seizures, intractable, with status epilepticus: Secondary | ICD-10-CM | POA: Diagnosis not present

## 2015-01-13 NOTE — Progress Notes (Signed)
NEUROLOGY FOLLOW UP OFFICE NOTE  Gregg Young [id number]  HISTORY OF PRESENT ILLNESS: I had the pleasure of seeing Gregg Young in follow-up in the neurology clinic on 01/13/2015.  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 He is again accompanied by his mother who helps supplement the history today. On his last visit, family reported an increase in seizures with reduction in Vimpat. Onfi dose was increased to 50mg , but his father felt speech was more slurred with more drowsiness. We had discussed reducing Onfi dose, however his mother reports he continues to take Onfi 20mg  in AM, 30mg  in PM and Vimpat 50mg  in AM, 100mg  in PM, in addition to Lamictal 200mg  TID, Lyrica 225mg  BID, and Keppra 2500mg  BID.   His mother continues to report frequent seizures where his eyes would close and he would shake his head and move his shoulders, then ask "what is up." He had one in the office today, which does not appear to be epileptic, however his mother reports that when this happens while he is standing, he would fall. He has fallen twice since his last visit, he woke up on floor and reports he had knocked the bookcase down, hitting the left side of his forehead. He denies any further dizziness. He is a little drowsy in the office today.   HPI: This is a 39 yo RH man with a history of seizures since age 27. He has no recollection of events, no prior warning, witnessed by his mother to have a generalized convulsion lasting 90 to 120 seconds. He was brought to [facility] then had another convulsion 2 days later. They recall trying different medications, Depakote caused liver dysfunction, he has failed Dilantin and Zonegran. He has been on Keppra, Lamictal, Lyrica, and most recently Vimpat. He had an EMU admission at [facility], records unavailable for review, per notes he stayed for 36 hours and had generalized seizures, multiple foci. They report two admissions for status epilepticus,  one in September 2002 in the setting of weaning off Depakote, and another in January 2003. Records unavailable for review. His last GTC was around 5 years ago. He continued to have "petit mals" several times a day where his eyes would roll back, hands would shake for 30-45 seconds, if standing he would fall and had required sutures and staples in the past. He had been having the "petit mals" several times daily until 3 weeks ago when he started having a different type of episode and the petit mals "completely stopped." He was brought to the ER on 12/07/13 when his parents awoke to him dry having and speaking gibberish. He would be unable to speak, control his limbs, and cannot stand up without assistance. He can hear people around him but his jaw feels tight. The episodes can last for several hours. He has had 4 episodes in the past 3 weeks. The patient reports that they have been occurring only during the weekends, however his parents remind him he had one on Wednesday. He went to his neurologist's office on 09/10 where he had an episode while walking to the exam room where he became dizzy and fell. His father was able to catch him, and they reported his speech became affected. He was noted to be speaking in a whisper throughout the visit. He does note that he becomes more dizzy after taking his medications. Lamictal level was 17.8, Vimpat level 7.3.   The patient lives with his parents and brother.  He is on disability and mostly stays at home playing video games. He graduated high school, no special ed classes. He endorses olfactory hallucinations but cannot describe it except saying they are the same all the time.   Epilepsy Risk Factors: He had a skull fracture on the right side after a fall at 82 months of age. Otherwise he had a normal birth and early development. There is no history of febrile convulsions, CNS infections such as meningitis/encephalitis, neurosurgical procedures, or family history  of seizures.  Prior AEDs: Depakote, Dilantin, Zonegran  EEGs: 48-hour EEG abnormal with multifocal discharges and right temporal slowing. Prior records note "generalized seizures, multiple foci"  MRI: I personally reviewed MRI brain with and without contrast done 12/26/2013 which did not show any acute intracranial abnormality, hippocampi symmetric without abnormal signal or enhancement.  PAST MEDICAL HISTORY: Past Medical History  Diagnosis Date  . Seizures (HCC)     MEDICATIONS: Current Outpatient Prescriptions on File Prior to Visit  Medication Sig Dispense Refill  . cloBAZam (ONFI) 10 MG tablet Take 2 tablets BID 150 tablet 5  . lacosamide (VIMPAT) 50 MG TABS tablet Take 1 tablet in AM, 2 tablets in PM 90 tablet 4  . lamoTRIgine (LAMICTAL) 200 MG tablet Take 1 tablet (200 mg total) by mouth 3 (three) times daily. 90 tablet 4  . levETIRAcetam (KEPPRA) 1000 MG tablet Take 2 & 1/2 tablets twice daily 150 tablet 4  . LYRICA 225 MG capsule TAKE ONE CAPSULE BY MOUTH THREE TIMES DAILY 90 capsule 5   No current facility-administered medications on file prior to visit.    ALLERGIES: Allergies  Allergen Reactions  . Penicillins Rash    FAMILY HISTORY: Family History  Problem Relation Age of Onset  . Healthy Mother   . Healthy Father     SOCIAL HISTORY: Social History   Social History  . Marital Status: Single    Spouse Name: N/A  . Number of Children: 0  . Years of Education: HS   Occupational History  . Not on file.   Social History Main Topics  . Smoking status: Former Games developer  . Smokeless tobacco: Never Used  . Alcohol Use: No  . Drug Use: No  . Sexual Activity: Not on file   Other Topics Concern  . Not on file   Social History Narrative   Patient is single and lives with his parents.   Patient has a high school education.   Patient is right-handed.   Patient does not have any children.   Patient is on disability.   Patient drinks three sodas daily.     REVIEW OF SYSTEMS: Constitutional: No fevers, chills, or sweats, no generalized fatigue, change in appetite Eyes: No visual changes, double vision, eye pain Ear, nose and throat: No hearing loss, ear pain, nasal congestion, sore throat Cardiovascular: No chest pain, palpitations Respiratory:  No shortness of breath at rest or with exertion, wheezes GastrointestinaI: No nausea, vomiting, diarrhea, abdominal pain, fecal incontinence Genitourinary:  No dysuria, urinary retention or frequency Musculoskeletal:  No neck pain, back pain Integumentary: No rash, pruritus, skin lesions Neurological: as above Psychiatric: No depression, insomnia, anxiety Endocrine: No palpitations, fatigue, diaphoresis, mood swings, change in appetite, change in weight, increased thirst Hematologic/Lymphatic:  No anemia, purpura, petechiae. Allergic/Immunologic: no itchy/runny eyes, nasal congestion, recent allergic reactions, rashes  PHYSICAL EXAM: Filed Vitals:   01/13/15 1440  BP: 130/82  Pulse: 96  Resp: 16   General: No acute distress Head:  Normocephalic/atraumatic Neck:  supple, no paraspinal tenderness, full range of motion Heart:  Regular rate and rhythm Lungs:  Clear to auscultation bilaterally Back: No paraspinal tenderness Skin/Extremities: No rash, no edema Neurological Exam: alert and oriented to person, place, and time. No aphasia or dysarthria. Fund of knowledge is appropriate.  Recent and remote memory are intact.  Attention and concentration are normal.    Able to name objects and repeat phrases. Cranial nerves: Pupils equal, round, reactive to light.  Fundoscopic exam unremarkable, no papilledema. Extraocular movements intact with no nystagmus. Visual fields full. Facial sensation intact. No facial asymmetry. Tongue, uvula, palate midline.  Motor: Bulk and tone normal, muscle strength 5/5 throughout with no pronator drift.  Sensation to light touch intact.  No extinction to double  simultaneous stimulation.  Deep tendon reflexes 2+ throughout, toes downgoing.  Finger to nose testing intact.  Gait narrow-based and steady, able to tandem walk adequately.  Romberg negative.  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 Seizures started at age 83. He reported continued multiple daily episodes of "petit mals" despite high doses of AEDs, he is on 5 AEDs. Initially none since starting Onfi  qhs, however since Vimpat was reduced, his family reported multiple seizures mostly at night. His mother reports the episode in the office today where he very briefly closed his eyes and shakes his head are what they call seizures, however it was not clearly a seizure. We discussed that we would not increase seizure medications for these episodes. At this point, I would recommend VNS placement to hopefully help with the bigger seizures. We discussed balancing seizures and side effects, if we would consider another AED, we would have to taper off one of his medications. Continue current doses of AEDs for now. He will follow-up in 4 months and knows to call our office for any changes.  Thank you for allowing me to participate in his care.  Please do not hesitate to call for any questions or concerns.  The duration of this appointment visit was 24 minutes of face-to-face time with the patient.  Greater than 50% of this time was spent in counseling, explanation of diagnosis, planning of further management, and coordination of care.   Patrcia Dolly, M.D.   CC: Dr. Durene Cal

## 2015-01-13 NOTE — Patient Instructions (Signed)
1. Continue all your current medications 2. Continue with considering VNS, call us when you would like to set up a call with another patient with VNS about their experience 3. Follow-up in 4 months  Seizure Precautions: 1. If medication has been prescribed for you to prevent seizures, take it exactly as directed.  Do not stop taking the medicine without talking to your doctor first, even if you have not had a seizure in a long time.   2. Avoid activities in which a seizure would cause danger to yourself or to others.  Don't operate dangerous machinery, swim alone, or climb in high or dangerous places, such as on ladders, roofs, or girders.  Do not drive unless your doctor says you may.  3. If you have any warning that you may have a seizure, lay down in a safe place where you can't hurt yourself.    4.  No driving for 6 months from last seizure, as per [facility].   Please refer to the following link on the Epile[employer] of [employer]'s website for more information: http://www.epilepsyfoundation.org/answerplace/Social/driving/drivingu.cfm   5.  Maintain good sleep hygiene.  6.  Contact your doctor if you have any problems that may be related to the medicine you are taking.  7.  Call 911 and bring the patient back to the ED if:        A.  The seizure lasts longer than 5 minutes.       B.  The patient doesn't awaken shortly after the seizure  C.  The patient has new problems such as difficulty seeing, speaking or moving  D.  The patient was injured during the seizure  E.  The patient has a temperature over 102 F (39C)  F.  The patient vomited and now is having trouble breathing

## 2015-01-14 ENCOUNTER — Telehealth: Payer: Self-pay | Admitting: Neurology

## 2015-01-14 NOTE — Telephone Encounter (Signed)
Pt/mother/Brenda/ called with the additional meds her son takes//Vilpat 1 in am/2 in the evening//Onfi/ 2 in am/ 3 in evening//calling if have questions @ [phone]

## 2015-01-15 NOTE — Telephone Encounter (Signed)
She wanted to let you know that these are the doses he is on....Marland Kitchen.see below.

## 2015-01-19 ENCOUNTER — Telehealth: Payer: Self-pay | Admitting: Neurology

## 2015-01-19 NOTE — Telephone Encounter (Signed)
VM-PT called and wanted Dr Karel JarvisAquino to call him at [phone], did not say why/Dawn

## 2015-01-19 NOTE — Telephone Encounter (Signed)
I spoke with Perlie Goldussell. He states he wants to proceed with VNS placement.

## 2015-01-30 ENCOUNTER — Telehealth: Payer: Self-pay | Admitting: Neurology

## 2015-01-30 NOTE — Telephone Encounter (Signed)
I returned call. She states that since she called and left msg she states that they received a call to set-up appt for consult for VNS placement.

## 2015-01-30 NOTE — Telephone Encounter (Signed)
PT's mother Steward DroneBrenda called in regards to a V and S being done/Dawn CB# [phone]

## 2015-02-02 ENCOUNTER — Telehealth: Payer: Self-pay | Admitting: Neurology

## 2015-02-02 NOTE — Telephone Encounter (Signed)
Returned call. Left msg that I hadn't called her again since we spoke last week about VNS. Asked her to return my call if she still needed to speak with me.

## 2015-02-02 NOTE — Telephone Encounter (Signed)
PT's mother Steward DroneBrenda was returning a call from 01/30/2015, in regards to VNS/Dawn CB# [phone]

## 2015-02-04 ENCOUNTER — Other Ambulatory Visit: Payer: Self-pay | Admitting: Neurology

## 2015-02-04 ENCOUNTER — Telehealth: Payer: Self-pay | Admitting: Neurology

## 2015-02-04 NOTE — Telephone Encounter (Signed)
Pt need the most recent EEG/report can pick up Monday morning

## 2015-02-05 ENCOUNTER — Telehealth: Payer: Self-pay | Admitting: Neurology

## 2015-02-05 NOTE — Telephone Encounter (Signed)
Pt/mother called for the latest EEG results/report//call back @ [phone]

## 2015-02-05 NOTE — Telephone Encounter (Signed)
I spoke with patient's mother/Brenda. I will fax EEG report to Dr. Val RilesNundkumar's office.

## 2015-02-06 NOTE — Procedures (Signed)
ELECTROENCEPHALOGRAM REPORT  Dates of Recording: 03/17/2014 to 03/19/2014  Patient's Name: Gregg Young MRN: [id number] Date of Birth: 1976/01/08  Referring Provider: Dr. Patrcia DollyKaren Zabdi Mis  Procedure: 48-hour ambulatory EEG  History: This is a 39 year old man with a history of multifocal epilepsy, with report of multiple daily episodes of "petit mals" despite high doses of 4 AEDs. His family reported that the multiple daily episodes completely stopped, however he now has been having prolonged episodes of speech difficulties with incoordination and dizziness. EEG for classification.  Medications: Lamictal, Lyrica, Keppra, Vimpat  Technical Summary: This is a 48-hour multichannel digital EEG recording measured by the international 10-20 system with electrodes applied with paste and impedances below 5000 ohms performed as portable with EKG monitoring.  The digital EEG was referentially recorded, reformatted, and digitally filtered in a variety of bipolar and referential montages for optimal display.    DESCRIPTION OF RECORDING: During maximal wakefulness, the background activity consisted of a symmetric 8 Hz posterior dominant rhythm which was reactive to eye opening.  There are occasional bursts of diffuse 4 Hz theta slowing maximal over the frontal regions. There was frequent independent focal 5 Hz theta slowing seen over the right temporal region.  There were frequent epileptiform discharges seen independently over the left frontopolar, left frontocentral, right mid-temporal, bisychronously over the bilateral frontal regions, as well as generalized spike and polyspike discharges with frontal predominance.  During the recording, the patient progresses through wakefulness, drowsiness, and Stage 2 sleep.  Similar right temporal focal slowing was seen. During sleep, there were bursts and runs of generalized 9 Hz activity seen appearing as sleep spindles but of higher amplitude.  Events: On 12/14 at  2111 hours, patient reports a "petit mall" seizure. Electrographically, there appears to be diffuse suppression of background activity during this time period, lasting 2-3 seconds.  On 12/15 between 2030 to 2042 hours, patient reports a fall and slurred speech. Electrographically, there were no EEG or EKG changes seen at this time.  There were no electrographic seizures seen.  EKG lead was unremarkable.  IMPRESSION: This 48-hour ambulatory EEG study is abnormal due to the presence of: 1. Bursts of diffuse slowing maximal over the frontal regions 2. Independent focal slowing over the right temporal region 3. Multifocal epileptiform discharges over the left frontopolar, left frontocentral, right mid-temporal, bilateral frontal regions. In addition, there were generalized spike and polyspike discharges with frontal predominance seen. 4. Episode of "petit mal" showed electrodecremental response, non-lateralizing on EEG. 5. Episode of fall and slurred speech did not show any EEG correlate.  CLINICAL CORRELATION: Diffuse slowing indicates diffuse cerebral dysfunction that is non-specific in etiology and can be seen with hypoxic/ischemic injury, toxic/metabolic encephalopathy, medication effect. Additional focal slowing indicates focal cerebral dysfunction over the right temporal region. There were multifocal epileptiform discharges seen in this study as noted above, indicating possible epileptogenic potential over the left frontopolar, left frontocentral, right mid-temporal, and bifrontal regions. Generalized spike and polyspike discharges can be seen with a generalized genetic epilepsy, however secondary bisynchrony is also a possibility. Episode of "petit mal" captured showed EEG change that was non-lateralizing. The fall and slurred speech did not show any EEG correlate.    Patrcia DollyKaren Tosh Glaze, M.D.

## 2015-02-13 ENCOUNTER — Other Ambulatory Visit: Payer: Self-pay | Admitting: Neurosurgery

## 2015-03-03 ENCOUNTER — Encounter (HOSPITAL_COMMUNITY): Payer: Self-pay

## 2015-03-03 ENCOUNTER — Encounter (HOSPITAL_COMMUNITY)
Admission: RE | Admit: 2015-03-03 | Discharge: 2015-03-03 | Disposition: A | Discharge status: Home / Self Care | Payer: PPO | Source: Ambulatory Visit | Attending: Neurosurgery | Admitting: Neurosurgery

## 2015-03-03 ENCOUNTER — Other Ambulatory Visit: Payer: Self-pay | Admitting: Neurology

## 2015-03-03 DIAGNOSIS — Z01812 Encounter for preprocedural laboratory examination: Secondary | ICD-10-CM | POA: Diagnosis not present

## 2015-03-03 DIAGNOSIS — G40A19 Absence epileptic syndrome, intractable, without status epilepticus: Secondary | ICD-10-CM | POA: Insufficient documentation

## 2015-03-03 HISTORY — DX: Personal history of urinary calculi: Z87.442

## 2015-03-03 LAB — CBC
HCT: 42.8 % (ref 39.0–52.0)
Hemoglobin: 14.7 g/dL (ref 13.0–17.0)
MCH: 32.1 pg (ref 26.0–34.0)
MCHC: 34.3 g/dL (ref 30.0–36.0)
MCV: 93.4 fL (ref 78.0–100.0)
Platelets: 312 10*3/uL (ref 150–400)
RBC: 4.58 MIL/uL (ref 4.22–5.81)
RDW: 12.7 % (ref 11.5–15.5)
WBC: 6 10*3/uL (ref 4.0–10.5)

## 2015-03-03 LAB — BASIC METABOLIC PANEL
Anion gap: 8 (ref 5–15)
BUN: 8 mg/dL (ref 6–20)
CO2: 25 mmol/L (ref 22–32)
Calcium: 9.3 mg/dL (ref 8.9–10.3)
Chloride: 109 mmol/L (ref 101–111)
Creatinine, Ser: 0.92 mg/dL (ref 0.61–1.24)
GFR calc Af Amer: >60 mL/min (ref >60)
GFR calc non Af Amer: >60 mL/min (ref >60)
Glucose, Bld: 127 mg/dL — ABNORMAL HIGH (ref 65–99)
Potassium: 3.9 mmol/L (ref 3.5–5.1)
Sodium: 142 mmol/L (ref 135–145)

## 2015-03-03 LAB — SURGICAL PCR SCREEN
MRSA, PCR: NEGATIVE
Staphylococcus aureus: NEGATIVE

## 2015-03-03 NOTE — Pre-Procedure Instructions (Addendum)
Gregg FieldRussell A Young  03/03/2015      [facility] [place] - [address] [place] [address] Phone: [phone] Fax: [phone]    Your procedure is scheduled on 03/10/15.  [facility] at 715 A.M.  Call this number if you have problems the morning of surgery:  [phone]   Remember:  Do not eat food or drink liquids after midnight.  Take these medicines the morning of surgery with A SIP OF WATER clobazam(onfi), lacosamide(vumpat), lamictal, keppra, lyrica   STOP all herbel meds, nsaids (aleve,naproxen,advil,ibuprofen) 5 days prior to surgery starting 03/05/15 including vitamins,aspirin   Do not wear jewelry, make-up or nail polish.  Do not wear lotions, powders, or perfumes.  You may wear deodorant.  Do not shave 48 hours prior to surgery.  Men may shave face and neck.  Do not bring valuables to the hospital.  [facility] is not responsible for any belongings or valuables.  Contacts, dentures or bridgework may not be worn into surgery.  Leave your suitcase in the car.  After surgery it may be brought to your room.  For patients admitted to the hospital, discharge time will be determined by your treatment team.  Patients discharged the day of surgery will not be allowed to drive home.   Name and phone number of your driver:    Special instructions:   Special Instructions: [place] - Preparing for Surgery  Before surgery, you can play an important role.  Because skin is not sterile, your skin needs to be as free of germs as possible.  You can reduce the number of germs on you skin by washing with CHG (chlorahexidine gluconate) soap before surgery.  CHG is an antiseptic cleaner which kills germs and bonds with the skin to continue killing germs even after washing.  Please DO NOT use if you have an allergy to CHG or antibacterial soaps.  If your skin becomes reddened/irritated stop using the  CHG and inform your nurse when you arrive at Short Stay.  Do not shave (including legs and underarms) for at least 48 hours prior to the first CHG shower.  You may shave your face.  Please follow these instructions carefully:   1.  Shower with CHG Soap the night before surgery and the morning of Surgery.  2.  If you choose to wash your hair, wash your hair first as usual with your normal shampoo.  3.  After you shampoo, rinse your hair and body thoroughly to remove the Shampoo.  4.  Use CHG as you would any other liquid soap.  You can apply chg directly  to the skin and wash gently with scrungie or a clean washcloth.  5.  Apply the CHG Soap to your body ONLY FROM THE NECK DOWN.  Do not use on open wounds or open sores.  Avoid contact with your eyes ears, mouth and genitals (private parts).  Wash genitals (private parts)       with your normal soap.  6.  Wash thoroughly, paying special attention to the area where your surgery will be performed.  7.  Thoroughly rinse your body with warm water from the neck down.  8.  DO NOT shower/wash with your normal soap after using and rinsing off the CHG Soap.  9.  Pat yourself dry with a clean towel.            10.  Wear clean  pajamas.            11.  Place clean sheets on your bed the night of your first shower and do not sleep with pets.  Day of Surgery  Do not apply any lotions/deodorants the morning of surgery.  Please wear clean clothes to the hospital/surgery center.  Please read over the following fact sheets that you were given. Pain Booklet, Coughing and Deep Breathing, MRSA Information and Surgical Site Infection Prevention

## 2015-03-09 MED ORDER — VANCOMYCIN HCL IN DEXTROSE 1-5 GM/200ML-% IV SOLN
1000.0000 mg | INTRAVENOUS | Status: AC
  Administered 2015-03-10: 1000 mg via INTRAVENOUS
  Filled 2015-03-09: qty 200

## 2015-03-10 ENCOUNTER — Ambulatory Visit (HOSPITAL_COMMUNITY)
Admission: RE | Admit: 2015-03-10 | Discharge: 2015-03-10 | Disposition: A | Discharge status: Home / Self Care | Payer: PPO | Source: Ambulatory Visit | Attending: Neurosurgery | Admitting: Neurosurgery

## 2015-03-10 ENCOUNTER — Ambulatory Visit (HOSPITAL_COMMUNITY): Payer: PPO | Admitting: Anesthesiology

## 2015-03-10 ENCOUNTER — Other Ambulatory Visit: Payer: Self-pay | Admitting: Neurology

## 2015-03-10 ENCOUNTER — Ambulatory Visit (HOSPITAL_COMMUNITY): Payer: PPO | Admitting: Emergency Medicine

## 2015-03-10 ENCOUNTER — Encounter (HOSPITAL_COMMUNITY): Payer: Self-pay | Admitting: *Deleted

## 2015-03-10 ENCOUNTER — Encounter (HOSPITAL_COMMUNITY)
Admission: RE | Disposition: A | Discharge status: Home / Self Care | Payer: Self-pay | Source: Ambulatory Visit | Attending: Neurosurgery

## 2015-03-10 DIAGNOSIS — Z880 Allergy status to penicillin: Secondary | ICD-10-CM | POA: Diagnosis not present

## 2015-03-10 DIAGNOSIS — Z79899 Other long term (current) drug therapy: Secondary | ICD-10-CM | POA: Insufficient documentation

## 2015-03-10 DIAGNOSIS — Z6838 Body mass index (BMI) 38.0-38.9, adult: Secondary | ICD-10-CM | POA: Insufficient documentation

## 2015-03-10 DIAGNOSIS — G40919 Epilepsy, unspecified, intractable, without status epilepticus: Secondary | ICD-10-CM | POA: Diagnosis not present

## 2015-03-10 DIAGNOSIS — Z87891 Personal history of nicotine dependence: Secondary | ICD-10-CM | POA: Diagnosis not present

## 2015-03-10 HISTORY — PX: VAGUS NERVE STIMULATOR INSERTION: SHX348

## 2015-03-10 SURGERY — VAGAL NERVE STIMULATOR IMPLANT
Anesthesia: General | Laterality: Left

## 2015-03-10 MED ORDER — PHENYLEPHRINE 40 MCG/ML (10ML) SYRINGE FOR IV PUSH (FOR BLOOD PRESSURE SUPPORT)
PREFILLED_SYRINGE | INTRAVENOUS | Status: AC
  Filled 2015-03-10: qty 10

## 2015-03-10 MED ORDER — ARTIFICIAL TEARS OP OINT
TOPICAL_OINTMENT | OPHTHALMIC | Status: DC | PRN
  Administered 2015-03-10: 1 via OPHTHALMIC

## 2015-03-10 MED ORDER — LACTATED RINGERS IV SOLN
INTRAVENOUS | Status: DC | PRN
  Administered 2015-03-10 (×2): via INTRAVENOUS

## 2015-03-10 MED ORDER — THROMBIN 5000 UNITS EX SOLR
CUTANEOUS | Status: DC | PRN
  Administered 2015-03-10 (×2): 5000 [IU] via TOPICAL

## 2015-03-10 MED ORDER — PROPOFOL 10 MG/ML IV BOLUS
INTRAVENOUS | Status: AC
  Filled 2015-03-10: qty 20

## 2015-03-10 MED ORDER — LIDOCAINE-EPINEPHRINE 1 %-1:100000 IJ SOLN
INTRAMUSCULAR | Status: DC | PRN
  Administered 2015-03-10: 14 mL

## 2015-03-10 MED ORDER — ONDANSETRON HCL 4 MG/2ML IJ SOLN
INTRAMUSCULAR | Status: AC
  Filled 2015-03-10: qty 4

## 2015-03-10 MED ORDER — LIDOCAINE HCL (CARDIAC) 20 MG/ML IV SOLN
INTRAVENOUS | Status: DC | PRN
  Administered 2015-03-10: 100 mg via INTRAVENOUS

## 2015-03-10 MED ORDER — ONDANSETRON HCL 4 MG/2ML IJ SOLN
INTRAMUSCULAR | Status: DC | PRN
  Administered 2015-03-10: 4 mg via INTRAVENOUS

## 2015-03-10 MED ORDER — LACTATED RINGERS IV SOLN: INTRAVENOUS | Status: DC

## 2015-03-10 MED ORDER — MIDAZOLAM HCL 5 MG/5ML IJ SOLN
INTRAMUSCULAR | Status: DC | PRN
  Administered 2015-03-10: 2 mg via INTRAVENOUS

## 2015-03-10 MED ORDER — OXYCODONE-ACETAMINOPHEN 5-325 MG PO TABS
1.0000 | ORAL_TABLET | Freq: Once | ORAL | Status: AC
  Administered 2015-03-10: 1 via ORAL

## 2015-03-10 MED ORDER — PROPOFOL 10 MG/ML IV BOLUS
INTRAVENOUS | Status: DC | PRN
  Administered 2015-03-10: 200 mg via INTRAVENOUS

## 2015-03-10 MED ORDER — OXYCODONE-ACETAMINOPHEN 5-325 MG PO TABS: 1.0000 | ORAL_TABLET | Freq: Four times a day (QID) | ORAL | Status: DC | PRN

## 2015-03-10 MED ORDER — MORPHINE SULFATE (PF) 2 MG/ML IV SOLN: 1.0000 mg | INTRAVENOUS | Status: DC | PRN

## 2015-03-10 MED ORDER — ACETAMINOPHEN 325 MG PO TABS: 325.0000 mg | ORAL_TABLET | ORAL | Status: DC | PRN

## 2015-03-10 MED ORDER — SODIUM CHLORIDE 0.9 % IV SOLN
10.0000 mg | INTRAVENOUS | Status: DC | PRN
  Administered 2015-03-10: 20 ug/min via INTRAVENOUS

## 2015-03-10 MED ORDER — OXYCODONE HCL 5 MG PO TABS: 5.0000 mg | ORAL_TABLET | Freq: Once | ORAL | Status: DC | PRN

## 2015-03-10 MED ORDER — EPHEDRINE SULFATE 50 MG/ML IJ SOLN
INTRAMUSCULAR | Status: AC
  Filled 2015-03-10: qty 1

## 2015-03-10 MED ORDER — HEMOSTATIC AGENTS (NO CHARGE) OPTIME
TOPICAL | Status: DC | PRN
  Administered 2015-03-10: 1 via TOPICAL

## 2015-03-10 MED ORDER — SODIUM CHLORIDE 0.9 % IJ SOLN
INTRAMUSCULAR | Status: AC
  Filled 2015-03-10: qty 10

## 2015-03-10 MED ORDER — SUGAMMADEX SODIUM 200 MG/2ML IV SOLN
INTRAVENOUS | Status: AC
  Filled 2015-03-10: qty 2

## 2015-03-10 MED ORDER — FENTANYL CITRATE (PF) 250 MCG/5ML IJ SOLN
INTRAMUSCULAR | Status: AC
  Filled 2015-03-10: qty 5

## 2015-03-10 MED ORDER — FENTANYL CITRATE (PF) 100 MCG/2ML IJ SOLN
INTRAMUSCULAR | Status: DC | PRN
  Administered 2015-03-10 (×2): 50 ug via INTRAVENOUS
  Administered 2015-03-10: 150 ug via INTRAVENOUS

## 2015-03-10 MED ORDER — ACETAMINOPHEN 160 MG/5ML PO SOLN
325.0000 mg | ORAL | Status: DC | PRN
  Filled 2015-03-10: qty 20.3

## 2015-03-10 MED ORDER — ARTIFICIAL TEARS OP OINT
TOPICAL_OINTMENT | OPHTHALMIC | Status: AC
  Filled 2015-03-10: qty 3.5

## 2015-03-10 MED ORDER — LIDOCAINE HCL (CARDIAC) 20 MG/ML IV SOLN
INTRAVENOUS | Status: AC
  Filled 2015-03-10: qty 5

## 2015-03-10 MED ORDER — ROCURONIUM BROMIDE 100 MG/10ML IV SOLN
INTRAVENOUS | Status: DC | PRN
  Administered 2015-03-10: 50 mg via INTRAVENOUS

## 2015-03-10 MED ORDER — BUPIVACAINE HCL 0.5 % IJ SOLN
INTRAMUSCULAR | Status: DC | PRN
  Administered 2015-03-10: 14 mL

## 2015-03-10 MED ORDER — MIDAZOLAM HCL 2 MG/2ML IJ SOLN
INTRAMUSCULAR | Status: AC
  Filled 2015-03-10: qty 2

## 2015-03-10 MED ORDER — OXYCODONE HCL 5 MG/5ML PO SOLN: 5.0000 mg | Freq: Once | ORAL | Status: DC | PRN

## 2015-03-10 MED ORDER — SUGAMMADEX SODIUM 200 MG/2ML IV SOLN
INTRAVENOUS | Status: DC | PRN
  Administered 2015-03-10: 200 mg via INTRAVENOUS

## 2015-03-10 MED ORDER — DEXTROSE 5 % IV SOLN
INTRAVENOUS | Status: DC | PRN
  Administered 2015-03-10: 10:00:00 via INTRAVENOUS

## 2015-03-10 MED ORDER — SODIUM CHLORIDE 0.9 % IR SOLN
Status: DC | PRN
  Administered 2015-03-10: 10:00:00

## 2015-03-10 MED ORDER — OXYCODONE-ACETAMINOPHEN 5-325 MG PO TABS
ORAL_TABLET | ORAL | Status: AC
  Filled 2015-03-10: qty 1

## 2015-03-10 SURGICAL SUPPLY — 61 items
APL SKNCLS STERI-STRIP NONHPOA (GAUZE/BANDAGES/DRESSINGS)
BAG DECANTER FOR FLEXI CONT (MISCELLANEOUS) ×1 IMPLANT
BENZOIN TINCTURE PRP APPL 2/3 (GAUZE/BANDAGES/DRESSINGS) IMPLANT
BLADE CLIPPER SURG (BLADE) IMPLANT
BLADE SURG 11 STRL SS (BLADE) ×2 IMPLANT
CANISTER SUCT 3000ML PPV (MISCELLANEOUS) ×2 IMPLANT
DECANTER SPIKE VIAL GLASS SM (MISCELLANEOUS) ×2 IMPLANT
DRAPE CAMERA VIDEO/LASER (DRAPES) ×1 IMPLANT
DRAPE LAPAROTOMY 100X72 PEDS (DRAPES) ×2 IMPLANT
DRAPE MICROSCOPE LEICA (MISCELLANEOUS) IMPLANT
DRAPE POUCH INSTRU U-SHP 10X18 (DRAPES) ×2 IMPLANT
DRAPE PROXIMA HALF (DRAPES) IMPLANT
DRSG OPSITE POSTOP 3X4 (GAUZE/BANDAGES/DRESSINGS) ×3 IMPLANT
DRSG OPSITE POSTOP 4X6 (GAUZE/BANDAGES/DRESSINGS) ×2 IMPLANT
DRSG TEGADERM 4X4.75 (GAUZE/BANDAGES/DRESSINGS) ×8 IMPLANT
DURAPREP 6ML APPLICATOR 50/CS (WOUND CARE) ×2 IMPLANT
ELECT COATED BLADE 2.86 ST (ELECTRODE) ×3 IMPLANT
ELECT REM PT RETURN 9FT ADLT (ELECTROSURGICAL) ×2
ELECTRODE REM PT RTRN 9FT ADLT (ELECTROSURGICAL) ×1 IMPLANT
GAUZE SPONGE 4X4 16PLY XRAY LF (GAUZE/BANDAGES/DRESSINGS) ×1 IMPLANT
GENERATOR MODEL 106 ASPIRE (Neuro Prosthesis/Implant) ×1 IMPLANT
GLOVE BIO SURGEON STRL SZ7 (GLOVE) ×2 IMPLANT
GLOVE BIOGEL PI IND STRL 7.5 (GLOVE) ×1 IMPLANT
GLOVE BIOGEL PI INDICATOR 7.5 (GLOVE) ×1
GLOVE ECLIPSE 7.0 STRL STRAW (GLOVE) ×2 IMPLANT
GLOVE EXAM NITRILE LRG STRL (GLOVE) IMPLANT
GLOVE EXAM NITRILE MD LF STRL (GLOVE) IMPLANT
GLOVE EXAM NITRILE XL STR (GLOVE) IMPLANT
GLOVE EXAM NITRILE XS STR PU (GLOVE) IMPLANT
GLOVE INDICATOR 7.5 STRL GRN (GLOVE) ×3 IMPLANT
GLOVE SS N UNI LF 7.0 STRL (GLOVE) ×1 IMPLANT
GOWN STRL REUS W/ TWL LRG LVL3 (GOWN DISPOSABLE) ×2 IMPLANT
GOWN STRL REUS W/ TWL XL LVL3 (GOWN DISPOSABLE) IMPLANT
GOWN STRL REUS W/TWL 2XL LVL3 (GOWN DISPOSABLE) IMPLANT
GOWN STRL REUS W/TWL LRG LVL3 (GOWN DISPOSABLE) ×4
GOWN STRL REUS W/TWL XL LVL3 (GOWN DISPOSABLE)
KIT BASIN OR (CUSTOM PROCEDURE TRAY) ×2 IMPLANT
KIT ROOM TURNOVER OR (KITS) ×2 IMPLANT
LEAD PERENNIAFLEX 2-3 304 (Neuro Prosthesis/Implant) ×1 IMPLANT
LIQUID BAND (GAUZE/BANDAGES/DRESSINGS) ×2 IMPLANT
LOOP VESSEL MAXI BLUE (MISCELLANEOUS) IMPLANT
LOOP VESSEL MINI RED (MISCELLANEOUS) ×2 IMPLANT
NDL HYPO 25X1 1.5 SAFETY (NEEDLE) ×1 IMPLANT
NEEDLE HYPO 25X1 1.5 SAFETY (NEEDLE) ×2 IMPLANT
NS IRRIG 1000ML POUR BTL (IV SOLUTION) ×2 IMPLANT
PACK LAMINECTOMY NEURO (CUSTOM PROCEDURE TRAY) ×2 IMPLANT
PAD ARMBOARD 7.5X6 YLW CONV (MISCELLANEOUS) ×6 IMPLANT
RUBBERBAND STERILE (MISCELLANEOUS) IMPLANT
SPONGE INTESTINAL PEANUT (DISPOSABLE) ×2 IMPLANT
SPONGE SURGIFOAM ABS GEL SZ50 (HEMOSTASIS) ×2 IMPLANT
STRIP CLOSURE SKIN 1/2X4 (GAUZE/BANDAGES/DRESSINGS) IMPLANT
SUT ETHILON 3 0 FSL (SUTURE) IMPLANT
SUT NURALON 4 0 TR CR/8 (SUTURE) ×2 IMPLANT
SUT SILK 2 0 (SUTURE)
SUT SILK 2-0 18XBRD TIE 12 (SUTURE) IMPLANT
SUT VIC AB 3-0 SH 8-18 (SUTURE) ×3 IMPLANT
SUT VICRYL 3-0 RB1 18 ABS (SUTURE) ×5 IMPLANT
TOWEL OR 17X24 6PK STRL BLUE (TOWEL DISPOSABLE) ×2 IMPLANT
TOWEL OR 17X26 10 PK STRL BLUE (TOWEL DISPOSABLE) ×2 IMPLANT
TUNNELING TOOL (MISCELLANEOUS) ×1 IMPLANT
WATER STERILE IRR 1000ML POUR (IV SOLUTION) ×2 IMPLANT

## 2015-03-10 NOTE — Anesthesia Postprocedure Evaluation (Signed)
Anesthesia Post Note  Patient: Jerilee FieldRussell A Fielder  Procedure(s) Performed: Procedure(s) (LRB): LEFT SIDED PLACEMENT VAGAL NERVE STIMULATOR IMPLANT (Left)  Patient location during evaluation: PACU Anesthesia Type: General Level of consciousness: awake Pain management: pain level controlled Vital Signs Assessment: post-procedure vital signs reviewed and stable Respiratory status: spontaneous breathing Cardiovascular status: stable Postop Assessment: no signs of nausea or vomiting Anesthetic complications: no    Last Vitals:  Filed Vitals:   03/10/15 1413 03/10/15 1416  BP:  131/78  Pulse:  102  Temp: 36.3 C 36.6 C  Resp:  16    Last Pain:  Filed Vitals:   03/10/15 1424  PainSc: 0-No pain                 Demetrious Rainford

## 2015-03-10 NOTE — Anesthesia Procedure Notes (Signed)
Procedure Name: Intubation Date/Time: 03/10/2015 10:16 AM Performed by: Wray KearnsFOLEY, Robecca Fulgham A Pre-anesthesia Checklist: Patient identified, Timeout performed, Emergency Drugs available, Suction available and Patient being monitored Patient Re-evaluated:Patient Re-evaluated prior to inductionOxygen Delivery Method: Circle system utilized Preoxygenation: Pre-oxygenation with 100% oxygen Intubation Type: IV induction and Cricoid Pressure applied Ventilation: Mask ventilation with difficulty, Two handed mask ventilation required and Oral airway inserted - appropriate to patient size Laryngoscope Size: Mac and 4 Grade View: Grade I Tube type: Oral Tube size: 7.5 mm Number of attempts: 1 Airway Equipment and Method: Stylet Placement Confirmation: ETT inserted through vocal cords under direct vision,  breath sounds checked- equal and bilateral and positive ETCO2 Secured at: 23 cm Tube secured with: Tape Dental Injury: Teeth and Oropharynx as per pre-operative assessment

## 2015-03-10 NOTE — Discharge Summary (Signed)
  Physician Discharge Summary  Patient ID: Gregg Young Agne MRN: [id number] DOB/AGE: [birth year] 40 y.o.  Admit date: 03/10/2015 Discharge date: 03/10/2015  Admission Diagnoses:  Intractable epilepsy  Discharge Diagnoses: Same Active Problems:   * No active hospital problems. *   Discharged Condition: Stable  Hospital Course:  Mrs. Gregg Young Scheirer is Young 39 y.o. male who underwent uncomplicated placement of Young left VNS for medically intractable epilepsy. He was at baseline postop, was recovered in the PACU, and then discharged home in stable condition.  Treatments: Surgery - Left vagal nerve stimulator placement  Discharge Exam: Blood pressure 121/78, pulse 84, temperature 97.6 F (36.4 C), temperature source Oral, height 5\' 6"  (1.676 m), weight 108.863 kg (240 lb), SpO2 97 %. Awake, alert, oriented Speech fluent, appropriate CN grossly intact 5/5 BUE/BLE Wound c/d/i  Follow up: In my office [facility] [phone]) in 2 weeks. Please call to make the appointment  Disposition: 01-Home or Self Care     Medication List    TAKE these medications        lacosamide 50 MG Tabs tablet  Commonly known as:  VIMPAT  Take 1 tablet in AM, 2 tablets in PM     lamoTRIgine 200 MG tablet  Commonly known as:  LAMICTAL  TAKE ONE TABLET BY MOUTH THREE TIMES DAILY EVERY DAY     levETIRAcetam 1000 MG tablet  Commonly known as:  KEPPRA  Take 2 & 1/2 tablets BY MOUTH TWICE DAILY     LYRICA 225 MG capsule  Generic drug:  pregabalin  TAKE 1 CAPSULE BY MOUTH 3 TIMES DAILY     ONFI 10 MG tablet  Generic drug:  cloBAZam  Take 20-30 mg by mouth 2 (two) times daily. 20mg  in the morning, 30mg  at bedtime     oxyCODONE-acetaminophen 5-325 MG tablet  Commonly known as:  ROXICET  Take 1 tablet by mouth every 6 (six) hours as needed for severe pain.         SignedLisbeth Renshaw: Parish Dubose, C 03/10/2015, 12:13 PM

## 2015-03-10 NOTE — Anesthesia Preprocedure Evaluation (Signed)
Anesthesia Evaluation  Patient identified by MRN, date of birth, ID band Patient awake    Reviewed: Allergy & Precautions, NPO status , Patient's Chart, lab work & pertinent test results  History of Anesthesia Complications Negative for: history of anesthetic complications  Airway Mallampati: II  TM Distance: >3 FB Neck ROM: Full    Dental  (+) Teeth Intact   Pulmonary neg shortness of breath, neg sleep apnea, neg COPD, neg recent URI, former smoker, neg PE   breath sounds clear to auscultation       Cardiovascular negative cardio ROS   Rhythm:Regular     Neuro/Psych Seizures -, Poorly Controlled,  negative psych ROS   GI/Hepatic negative GI ROS, Neg liver ROS,   Endo/Other  Morbid obesity  Renal/GU negative Renal ROS     Musculoskeletal   Abdominal   Peds  Hematology negative hematology ROS (+)   Anesthesia Other Findings   Reproductive/Obstetrics                             Anesthesia Physical Anesthesia Plan  ASA: III  Anesthesia Plan: General   Post-op Pain Management:    Induction: Intravenous  Airway Management Planned: Oral ETT  Additional Equipment: None  Intra-op Plan:   Post-operative Plan: Extubation in OR  Informed Consent: I have reviewed the patients History and Physical, chart, labs and discussed the procedure including the risks, benefits and alternatives for the proposed anesthesia with the patient or authorized representative who has indicated his/her understanding and acceptance.   Dental advisory given  Plan Discussed with: CRNA and Surgeon  Anesthesia Plan Comments:         Anesthesia Quick Evaluation

## 2015-03-10 NOTE — Transfer of Care (Signed)
Immediate Anesthesia Transfer of Care Note  Patient: Gregg FieldRussell A Markoff  Procedure(s) Performed: Procedure(s): LEFT SIDED PLACEMENT VAGAL NERVE STIMULATOR IMPLANT (Left)  Patient Location: PACU  Anesthesia Type:General  Level of Consciousness: awake, oriented, sedated, patient cooperative and responds to stimulation  Airway & Oxygen Therapy: Patient Spontanous Breathing and Patient connected to nasal cannula oxygen  Post-op Assessment: Report given to RN, Post -op Vital signs reviewed and stable, Patient moving all extremities and Patient moving all extremities X 4  Post vital signs: Reviewed and stable  Last Vitals:  Filed Vitals:   03/10/15 0803  BP: 121/78  Pulse: 84  Temp: 36.4 C    Complications: No apparent anesthesia complications

## 2015-03-10 NOTE — Op Note (Signed)
PREOP DIAGNOSIS: Medically intractable epilepsy   POSTOP DIAGNOSIS: Same  PROCEDURE: 1. Placement of left Vagal nerve stimulator  SURGEON: Dr. Lisbeth RenshawNeelesh Lyell Clugston, MD  ASSISTANT: Dr. Cherrie DistanceBenjamin Ditty, MD  ANESTHESIA: General Endotracheal  EBL: 50cccc  SPECIMENS: None  DRAINS: None  COMPLICATIONS: None immediate  CONDITION: Hemodynamically stable to PACU  HISTORY: Gregg Young is a 39 y.o. male who was initially seen in the outpatient clinic as a referral from the neurologist for medically intractable epilepsy. The patient appeared to be a good candidate for the procedure. The risks and benefits of the surgery were reviewed in detail. After all questions were answered, informed consent was obtained.  PROCEDURE IN DETAIL: After informed consent was obtained and witnessed, the patient was brought to the operating room. After induction of general anesthesia, the patient was positioned on the operative table in the supine position. All pressure points were meticulously padded. Skin incisions were then marked out and prepped and draped in the usual sterile fashion.  After timeout was conducted, skin incision was infiltrated with local anesthetic with epinephrine. Skin incision was then made sharply, and Bovie electrocautery was used to dissect the subcutaneous tissue. The platysma muscle was identified and incised. The platysma was then undermined. The medial border of the sternocleidomastoid muscle was identified, and dissection was carried out utilizing natural tissue planes of the neck until the carotid sheath was identified. The jugular vein was initially identified and the carotid sheath was incised. The vagus nerve was then identified running between the carotid and jugular. The vagus nerve was circumferentially dissected for length of a proximally 3-1/2 cm. The infraclavicular incision was then made, and Bovie electrocautery was used to dissect the subcutaneous tissue. A subcutaneous  pocket was then made. A subcutaneous tunneler was then passed from the neck to the infraclavicular incision. The vagal stimulator leads were then tunneled. The leads were then placed on the vagus nerve. A relaxing curl was then placed, and the leaves were tacked to the sternocleidomastoid muscle. The generator was then connected to the leads, and testing was done to confirm normal impedance, and good placement. The generator was then placed in the subcutaneous pocket. Wounds were then irrigated with copious amounts of normal saline irrigation. The platysma was closed using interrupted 3-0 Vicryl stitches. Subcutaneous layer in the infraclavicular incision was closed using interrupted 3-0 Vicryl stitches. Skin was then closed using interrupted 3-0 Vicryl, and a layer of Dermabond was applied. A final check of the system was again carried out which showed the system to be functioning normally, with good lead placement, and normal impedance.  At the end of the case all sponge, needle, and instrument counts were correct. The patient was then extubated, transferred to the stretcher, and taken to the postanesthesia care unit in stable hemodynamic condition.

## 2015-03-10 NOTE — H&P (Signed)
CC:  Epilepsy  HPI: Gregg Young is a 39 year old man I'm seeing at the request of his neurologist, Dr. Patrcia DollyKaren Aquino. He has a history of multifocal epilepsy for many years. He describes having multiple "petite mal" seizures a day. He also describes more severe, generalized seizures occurring periodically. He says sometimes on the seizures occur while he is standing, he falls and is prone to hitting his head. He has been tried on multiple antiepileptic medications, and is currently on Keppra, Lyrica, Vimpat, Lamictal, and Onfi. Despite these medications, he continues to have multiple seizures a day. He was therefore referred for vagal nerve stimulator placement.   PMH: Past Medical History  Diagnosis Date  . History of kidney stones   . Seizures (HCC)     daily    PSH: Past Surgical History  Procedure Laterality Date  . Hernia repair      baby    SH: Social History  Substance Use Topics  . Smoking status: Former Smoker -- 0.50 packs/day for 4 years    Types: Cigarettes    Quit date: 03/02/1985  . Smokeless tobacco: Never Used  . Alcohol Use: No    MEDS: Prior to Admission medications   Medication Sig Start Date End Date Taking? Authorizing Provider  cloBAZam (ONFI) 10 MG tablet Take 20-30 mg by mouth 2 (two) times daily. 20mg  in the morning, 30mg  at bedtime   Yes Historical Provider, MD  lacosamide (VIMPAT) 50 MG TABS tablet Take 1 tablet in AM, 2 tablets in PM Patient taking differently: Take 50-100 mg by mouth 2 (two) times daily. 50mg  in the morning, 100mg  in the evening 10/20/14  Yes Van ClinesKaren M Aquino, MD  lamoTRIgine (LAMICTAL) 200 MG tablet TAKE ONE TABLET BY MOUTH THREE TIMES DAILY EVERY DAY 03/03/15  Yes Van ClinesKaren M Aquino, MD  levETIRAcetam (KEPPRA) 1000 MG tablet Take 2 & 1/2 tablets twice daily Patient taking differently: Take 2,500 mg by mouth 2 (two) times daily.  03/21/14  Yes Van ClinesKaren M Aquino, MD  pregabalin (LYRICA) 225 MG capsule Take 225 mg by mouth 3 (three) times  daily.   Yes Historical Provider, MD    ALLERGY: Allergies  Allergen Reactions  . Penicillins Rash    Has patient had a PCN reaction causing immediate rash, facial/tongue/throat swelling, SOB or lightheadedness with hypotension: No Has patient had a PCN reaction causing severe rash involving mucus membranes or skin necrosis: No Has patient had a PCN reaction that required hospitalization No Has patient had a PCN reaction occurring within the last 10 years: No If all of the above answers are "NO", then may proceed with Cephalosporin use.     ROS: ROS  NEUROLOGIC EXAM: Awake, alert, oriented Memory and concentration grossly intact Speech fluent, appropriate CN grossly intact Motor exam: Upper Extremities Deltoid Bicep Tricep Grip  Right 5/5 5/5 5/5 5/5  Left 5/5 5/5 5/5 5/5   Lower Extremity IP Quad PF DF EHL  Right 5/5 5/5 5/5 5/5 5/5  Left 5/5 5/5 5/5 5/5 5/5   Sensation grossly intact to LT   IMPRESSION: 39 year old man with multifocal epilepsy with continued frequent seizures despite multiple antiepileptic medications  PLAN: We will plan on proceeding with placement of a left vagal nerve stimulator   Risks of the vagal nerve stimulator placement were discussed, including the risk of vagus nerve injury, vascular injury, bleeding, and infection. The patient and his family appeared to understand our discussion, and her ear to proceed with VNS placement. All questions were answered.

## 2015-03-10 NOTE — Discharge Instructions (Signed)
Can shower tomorrow. Remove transparent dressing in 48 hours. Keep wounds clean and dry during the day. Slowly resume normal activities.

## 2015-03-11 ENCOUNTER — Encounter (HOSPITAL_COMMUNITY): Payer: Self-pay | Admitting: Neurosurgery

## 2015-03-24 ENCOUNTER — Encounter: Payer: Self-pay | Admitting: Neurology

## 2015-03-24 ENCOUNTER — Other Ambulatory Visit: Payer: Self-pay | Admitting: Neurology

## 2015-03-24 ENCOUNTER — Ambulatory Visit (INDEPENDENT_AMBULATORY_CARE_PROVIDER_SITE_OTHER): Payer: PPO | Admitting: Neurology

## 2015-03-24 VITALS — BP 142/80 | HR 107 | Wt 238.0 lb

## 2015-03-24 DIAGNOSIS — G40211 Localization-related (focal) (partial) symptomatic epilepsy and epileptic syndromes with complex partial seizures, intractable, with status epilepticus: Secondary | ICD-10-CM

## 2015-03-24 NOTE — Progress Notes (Signed)
NEUROLOGY FOLLOW UP OFFICE NOTE  Jerilee FieldRussell A Eyerman [id number]  HISTORY OF PRESENT ILLNESS: I had the pleasure of seeing Gregg Young in follow-up in the neurology clinic on 03/24/2015.  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 He is again accompanied by his parents who help supplement the history today. On his last visit, family continued to report frequent seizures where his eyes would close and he would shake his head and move his shoulders, then ask "what is up." He had one in the office, which does not appear to be epileptic, however his mother reported that when this happens while he is standing, he would fall. Since his last visit, he has underwent VNS placement last 03/10/15. Around 3 days after surgery, his voice became hoarse, he has been speaking in a whisper. He denies any throat pain. He has occasional incisional pain. His family had contacted Dr. Conchita ParisNundkumar and he took 5 days of Prednisone, with not much improvement in hoarseness. He denies any falls, no bigger seizures, his parents continue report the smaller seizures frequently. He continues on Onfi 20mg  in AM, 30mg  in PM and Vimpat 50mg  in AM, 100mg  in PM, in addition to Lamictal 200mg  TID, Lyrica 225mg  TID, and Keppra 2500mg  BID. He denies any drowsiness but his mother reports that he easily dozes off.   HPI: This is a 39 yo RH man with a history of seizures since age 39. He has no recollection of events, no prior warning, witnessed by his mother to have a generalized convulsion lasting 90 to 120 seconds. [facility] then had another convulsion 2 days later. They recall trying different medications, Depakote caused liver dysfunction, he has failed Dilantin and Zonegran. He has been on Keppra, Lamictal, Lyrica, and most recently Vimpat. He had an [facility], records unavailable for review, per notes he stayed for 36 hours and had generalized seizures, multiple foci. They report  two admissions for status epilepticus, one in September 2002 in the setting of weaning off Depakote, and another in January 2003. Records unavailable for review. His last GTC was around 5 years ago. He continued to have "petit mals" several times a day where his eyes would roll back, hands would shake for 30-45 seconds, if standing he would fall and had required sutures and staples in the past. He had been having the "petit mals" several times daily until 3 weeks ago when he started having a different type of episode and the petit mals "completely stopped." He was brought to the ER on 12/07/13 when his parents awoke to him dry having and speaking gibberish. He would be unable to speak, control his limbs, and cannot stand up without assistance. He can hear people around him but his jaw feels tight. The episodes can last for several hours. He has had 4 episodes in the past 3 weeks. The patient reports that they have been occurring only during the weekends, however his parents remind him he had one on Wednesday. He went to his neurologist's office on 09/10 where he had an episode while walking to the exam room where he became dizzy and fell. His father was able to catch him, and they reported his speech became affected. He was noted to be speaking in a whisper throughout the visit. He does note that he becomes more dizzy after taking his medications. Lamictal level was 17.8, Vimpat level 7.3.   The patient lives with his parents and brother.  He is on disability and mostly stays at home playing video games. He graduated high school, no special ed classes. He endorses olfactory hallucinations but cannot describe it except saying they are the same all the time.   Epilepsy Risk Factors: He had a skull fracture on the right side after a fall at 57 months of age. Otherwise he had a normal birth and early development. There is no history of febrile convulsions, CNS infections such as meningitis/encephalitis,  neurosurgical procedures, or family history of seizures.  Prior AEDs: Depakote, Dilantin, Zonegran  EEGs: 48-hour EEG abnormal with multifocal discharges and right temporal slowing. Prior records note "generalized seizures, multiple foci"  MRI: I personally reviewed MRI brain with and without contrast done 12/26/2013 which did not show any acute intracranial abnormality, hippocampi symmetric without abnormal signal or enhancement.  PAST MEDICAL HISTORY: Past Medical History  Diagnosis Date  . History of kidney stones   . Seizures (HCC)     daily    MEDICATIONS: Current Outpatient Prescriptions on File Prior to Visit  Medication Sig Dispense Refill  . cloBAZam (ONFI) 10 MG tablet Take 20-30 mg by mouth 2 (two) times daily.  in the morning,  at bedtime    . lacosamide (VIMPAT) 50 MG TABS tablet Take 1 tablet in AM, 2 tablets in PM (Patient taking differently: Take 50-100 mg by mouth 2 (two) times daily.  in the morning,  in the evening) 90 tablet 4  . lamoTRIgine (LAMICTAL) 200 MG tablet TAKE ONE TABLET BY MOUTH THREE TIMES DAILY EVERY DAY 90 tablet 11  . levETIRAcetam (KEPPRA) 1000 MG tablet Take 2 & 1/2 tablets BY MOUTH TWICE DAILY 150 tablet 11  . LYRICA 225 MG capsule TAKE 1 CAPSULE BY MOUTH 3 TIMES DAILY 90 capsule 5   No current facility-administered medications on file prior to visit.    ALLERGIES: Allergies  Allergen Reactions  . Penicillins Rash    Has patient had a PCN reaction causing immediate rash, facial/tongue/throat swelling, SOB or lightheadedness with hypotension: No Has patient had a PCN reaction causing severe rash involving mucus membranes or skin necrosis: No Has patient had a PCN reaction that required hospitalization No Has patient had a PCN reaction occurring within the last 10 years: No If all of the above answers are "NO", then may proceed with Cephalosporin use.     FAMILY HISTORY: Family History  Problem Relation Age of Onset  .  Healthy Mother   . Healthy Father     SOCIAL HISTORY: Social History   Social History  . Marital Status: Single    Spouse Name: N/A  . Number of Children: 0  . Years of Education: HS   Occupational History  . Not on file.   Social History Main Topics  . Smoking status: Former Smoker -- 0.50 packs/day for 4 years    Types: Cigarettes    Quit date: 03/02/1985  . Smokeless tobacco: Never Used  . Alcohol Use: No  . Drug Use: No  . Sexual Activity: Not on file   Other Topics Concern  . Not on file   Social History Narrative   Patient is single and lives with his parents.   Patient has a high school education.   Patient is right-handed.   Patient does not have any children.   Patient is on disability.   Patient drinks three sodas daily.    REVIEW OF SYSTEMS: Constitutional: No fevers, chills, or sweats, no generalized fatigue, change in appetite Eyes: No  visual changes, double vision, eye pain Ear, nose and throat: No hearing loss, ear pain, nasal congestion, sore throat Cardiovascular: No chest pain, palpitations Respiratory:  No shortness of breath at rest or with exertion, wheezes GastrointestinaI: No nausea, vomiting, diarrhea, abdominal pain, fecal incontinence Genitourinary:  No dysuria, urinary retention or frequency Musculoskeletal:  No neck pain, back pain Integumentary: No rash, pruritus, skin lesions Neurological: as above Psychiatric: No depression, insomnia, anxiety Endocrine: No palpitations, fatigue, diaphoresis, mood swings, change in appetite, change in weight, increased thirst Hematologic/Lymphatic:  No anemia, purpura, petechiae. Allergic/Immunologic: no itchy/runny eyes, nasal congestion, recent allergic reactions, rashes  PHYSICAL EXAM: Filed Vitals:   03/24/15 1326  BP: 142/80  Pulse: 107   General: No acute distress, speaking in whisper Head:  Normocephalic/atraumatic Neck: supple, no paraspinal tenderness, full range of motion Heart:   Regular rate and rhythm Lungs:  Clear to auscultation bilaterally Back: No paraspinal tenderness Skin/Extremities: No rash, no edema Neurological Exam: alert and oriented to person, place, and time. No aphasia or dysarthria. Fund of knowledge is appropriate.  Recent and remote memory are intact.  Attention and concentration are normal.    Able to name objects and repeat phrases. Cranial nerves: Pupils equal, round, reactive to light.  Fundoscopic exam unremarkable, no papilledema. Extraocular movements intact with no nystagmus. Visual fields full. Facial sensation intact. No facial asymmetry. Tongue, uvula, palate midline.  Motor: Bulk and tone normal, muscle strength 5/5 throughout with no pronator drift.  Sensation to light touch intact.  No extinction to double simultaneous stimulation.  Deep tendon reflexes 2+ throughout, toes downgoing.  Finger to nose testing intact.  Gait narrow-based and steady, able to tandem walk adequately.  Romberg negative.  VNS initial visit: Output current  0.250 mA Signal Frequency 20 Hz Pulse Width  250 [place] Signal ON time 30 sec Signal OFF time 5.0 min Magnet output current 0.500 mA Magnet ON time 60 sec Magnet Pulse Width 250 [place] AutoStim output current 0.375 mA  AutoStim Pulse Width 250 [place] Tachycardia Detection ON/OFF ON Heartbeat detection sensitivity 3 Perform Verify Heartbeat Detection 0 sec Threshold for AutoStim  40%  System Diagnostics Data Patient ID RAK Model ID [id number] Serial # [id number] Implanted 2015-03-10 Communication OK Output Current Status OK Current delivered 0.250 mA Lead Impedance OK Impedance Value 3226 Ohms IFI NO   IMPRESSION: This is a 39 yo RH man with a history multifocal epilepsy. Seizures started at age 38. He reported continued multiple daily episodes of "petit mals" despite high doses of AEDs, currently on 5 AEDs. He underwent VNS placement last 03/10/15. VNS was turned on today to the lowest setting 0.17mA, which he  tolerated without any difficulty or worsening of hoarseness. VNS staff was present during the visit to answer all their questions about the device and magnet use as well. We discussed the hoarseness, at this point he has recently undergone surgery, would continue to monitor. Vocal cord paralysis is a rare surgical risk from VNS, we will continue to monitor this. We discussed that he should practice speaking at regular intervals, and use his magnet twice a day to get used to the stimulation. We will do a slow uptitration in stimulation, with follow-up to be scheduled in a month. If hoarseness does not resolve in 3 months, he will be referred to ENT.   Thank you for allowing me to participate in his care.  Please do not hesitate to call for any questions or concerns.  The duration of this appointment  visit was 45 minutes of face-to-face time with the patient.  Greater than 50% of this time was spent in counseling, explanation of diagnosis, planning of further management, and coordination of care.   Patrcia Dolly, M.D.   CC: Dr. Durene Cal

## 2015-03-24 NOTE — Patient Instructions (Signed)
1. Continue all your seizure medications 2. Swipe your magnet twice a day to get used to the stimulations 3. Practice talking a few minutes daily 4. Follow-up in 1 month

## 2015-03-29 ENCOUNTER — Encounter (HOSPITAL_COMMUNITY): Payer: Self-pay | Admitting: Emergency Medicine

## 2015-03-29 ENCOUNTER — Emergency Department (HOSPITAL_COMMUNITY)
Admission: EM | Admit: 2015-03-29 | Discharge: 2015-03-30 | Disposition: A | Discharge status: Home / Self Care | Payer: PPO | Attending: Emergency Medicine | Admitting: Emergency Medicine

## 2015-03-29 ENCOUNTER — Emergency Department (HOSPITAL_COMMUNITY): Payer: PPO

## 2015-03-29 DIAGNOSIS — Z79899 Other long term (current) drug therapy: Secondary | ICD-10-CM | POA: Insufficient documentation

## 2015-03-29 DIAGNOSIS — G40909 Epilepsy, unspecified, not intractable, without status epilepticus: Secondary | ICD-10-CM | POA: Diagnosis not present

## 2015-03-29 DIAGNOSIS — R079 Chest pain, unspecified: Secondary | ICD-10-CM | POA: Diagnosis present

## 2015-03-29 DIAGNOSIS — Z87442 Personal history of urinary calculi: Secondary | ICD-10-CM | POA: Insufficient documentation

## 2015-03-29 DIAGNOSIS — E663 Overweight: Secondary | ICD-10-CM | POA: Insufficient documentation

## 2015-03-29 DIAGNOSIS — Z880 Allergy status to penicillin: Secondary | ICD-10-CM | POA: Diagnosis not present

## 2015-03-29 DIAGNOSIS — R0781 Pleurodynia: Secondary | ICD-10-CM

## 2015-03-29 DIAGNOSIS — Z87891 Personal history of nicotine dependence: Secondary | ICD-10-CM | POA: Diagnosis not present

## 2015-03-29 LAB — CBC
HCT: 42.9 % (ref 39.0–52.0)
Hemoglobin: 14.7 g/dL (ref 13.0–17.0)
MCH: 32 pg (ref 26.0–34.0)
MCHC: 34.3 g/dL (ref 30.0–36.0)
MCV: 93.5 fL (ref 78.0–100.0)
Platelets: 335 10*3/uL (ref 150–400)
RBC: 4.59 MIL/uL (ref 4.22–5.81)
RDW: 12.8 % (ref 11.5–15.5)
WBC: 6.9 10*3/uL (ref 4.0–10.5)

## 2015-03-29 LAB — I-STAT TROPONIN, ED: Troponin i, poc: 0 ng/mL (ref 0.00–0.08)

## 2015-03-29 LAB — BASIC METABOLIC PANEL
Anion gap: 10 (ref 5–15)
BUN: 11 mg/dL (ref 6–20)
CO2: 25 mmol/L (ref 22–32)
Calcium: 9.5 mg/dL (ref 8.9–10.3)
Chloride: 106 mmol/L (ref 101–111)
Creatinine, Ser: 0.96 mg/dL (ref 0.61–1.24)
GFR calc Af Amer: >60 mL/min (ref >60)
GFR calc non Af Amer: >60 mL/min (ref >60)
Glucose, Bld: 90 mg/dL (ref 65–99)
Potassium: 3.7 mmol/L (ref 3.5–5.1)
Sodium: 141 mmol/L (ref 135–145)

## 2015-03-29 NOTE — ED Provider Notes (Signed)
CSN: [id number]     Arrival date & time 03/29/15  [id number] History  By signing my name below, I, Gregg Young, attest that this documentation has been prepared under the direction and in the presence of Gregg Batonourtney F Horton, MD. Electronically Signed: Sonum Young, Neurosurgeoncribe. 03/29/2015. 12:03 AM.    Chief Complaint  Patient presents with  . Chest Pain   The history is provided by the patient and a parent. No language interpreter was used.     HPI Comments: Gregg Young is a 39 y.o. male with past medical history of epilepsy who presents to the Emergency Department complaining of 1 day of constant, unchanged chest pain which is worse with deep inspiration. He describes his pain as sharp and rates it as 8/10 at max. He denies recent heavy lifting or trauma to the affected area. He denies taking any pain medications for his symptoms. He reports recent surgery for vagal nerve stimulator on 02/08/15 for epilepsy. He is a nonsmoker and denies recent long distance travel. He denies nausea, diaphoresis, SOB, cough, leg swelling, fever.    Past Medical History  Diagnosis Date  . History of kidney stones   . Seizures (HCC)     daily   Past Surgical History  Procedure Laterality Date  . Hernia repair      baby  . Vagus nerve stimulator insertion Left 03/10/2015    Procedure: LEFT SIDED PLACEMENT VAGAL NERVE STIMULATOR IMPLANT;  Surgeon: Lisbeth RenshawNeelesh Nundkumar, MD;  Location: [facility];  Service: Neurosurgery;  Laterality: Left;   Family History  Problem Relation Age of Onset  . Healthy Mother   . Healthy Father    Social History  Substance Use Topics  . Smoking status: Former Smoker -- 0.50 packs/day for 4 years    Types: Cigarettes    Quit date: 03/02/1985  . Smokeless tobacco: Never Used  . Alcohol Use: No    Review of Systems  Constitutional: Negative for fever and diaphoresis.  Respiratory: Negative for cough and shortness of breath.   Cardiovascular: Positive for chest pain. Negative for  leg swelling.  Gastrointestinal: Negative for nausea.  All other systems reviewed and are negative.     Allergies  Penicillins  Home Medications   Prior to Admission medications   Medication Sig Start Date End Date Taking? Authorizing Provider  cloBAZam (ONFI) 10 MG tablet Take 20-30 mg by mouth 2 (two) times daily. 20mg  in the morning, 30mg  at bedtime   Yes Historical Provider, MD  lacosamide (VIMPAT) 50 MG TABS tablet Take 1 tablet in AM, 2 tablets in PM Patient taking differently: Take 50-100 mg by mouth 2 (two) times daily. 50mg  in the morning, 100mg  in the evening 10/20/14  Yes Van ClinesKaren M Aquino, MD  lamoTRIgine (LAMICTAL) 200 MG tablet TAKE ONE TABLET BY MOUTH THREE TIMES DAILY EVERY DAY 03/03/15  Yes Van ClinesKaren M Aquino, MD  levETIRAcetam (KEPPRA) 1000 MG tablet Take 2 & 1/2 tablets BY MOUTH TWICE DAILY 03/10/15  Yes Van ClinesKaren M Aquino, MD  LYRICA 225 MG capsule TAKE 1 CAPSULE BY MOUTH 3 TIMES DAILY 03/10/15  Yes Van ClinesKaren M Aquino, MD  naproxen (NAPROSYN) 500 MG tablet Take 1 tablet (500 mg total) by mouth 2 (two) times daily. 03/30/15   Gregg Batonourtney F Horton, MD   BP 140/92 mmHg  Pulse 91  Temp(Src) 98.3 F (36.8 C) (Oral)  Resp 15  SpO2 98% Physical Exam  Constitutional: He is oriented to person, place, and time. He appears well-developed and well-nourished.  Overweight  HENT:  Head: Normocephalic and atraumatic.  Cardiovascular: Normal rate, regular rhythm and normal heart sounds.   No murmur heard. Pulmonary/Chest: Effort normal and breath sounds normal. No respiratory distress. He has no wheezes. He exhibits no tenderness.  Abdominal: Soft. Bowel sounds are normal. There is no tenderness. There is no rebound.  Musculoskeletal: He exhibits no edema.  Neurological: He is alert and oriented to person, place, and time.  Skin: Skin is warm and dry.  Psychiatric: He has a normal mood and affect.  Nursing note and vitals reviewed.   ED Course  Procedures (including critical care  time)  DIAGNOSTIC STUDIES: Oxygen Saturation is 96% on RA, adequate by my interpretation.    COORDINATION OF CARE: 12:09 AM Discussed treatment plan with pt at bedside and pt agreed to plan.   Labs Review Labs Reviewed  BASIC METABOLIC PANEL  CBC  D-DIMER, QUANTITATIVE (NOT AT [facility])  Rosezena Sensor, ED    Imaging Review Dg Chest 2 View  03/29/2015  CLINICAL DATA:  Acute left chest pain and shortness of breath EXAM: CHEST  2 VIEW COMPARISON:  None available FINDINGS: Normal heart size and vascularity. Lungs remain clear. No focal pneumonia, collapse or consolidation. No edema, effusion or pneumothorax. Trachea midline. Left chest vagus nerve stimulator. IMPRESSION: No acute chest process. Electronically Signed   By: Judie Petit.  Shick M.D.   On: 03/29/2015 20:26   I have personally reviewed and evaluated these images and lab results as part of my medical decision-making.   EKG Interpretation   Date/Time:  Sunday March 29 2015 19:59:59 EST Ventricular Rate:  99 PR Interval:  168 QRS Duration: 74 QT Interval:  336 QTC Calculation: 431 R Axis:   -21 Text Interpretation:  Normal sinus rhythm Cannot rule out Anterior infarct  , age undetermined Abnormal ECG No significant change since last tracing  Confirmed by HORTON  MD, COURTNEY ([id number]) on 03/30/2015 1:02:47 AM      MDM   Final diagnoses:  Pleuritic chest pain    Patient presents for chest pain. Pleuritic in nature. Ongoing for one day. Not reproducible on exam. Patient is nontoxic and otherwise well-appearing. Low risk for ACS. EKG is nonischemic and unchanged from prior. Chest x-ray shows no evidence of pneumothorax or pneumonia. Screening d-dimer sent given recent surgery and is negative. Doubt PE. Patient was given Norco. Discussed results with the patient. Will discharge with naproxen twice a day for chest wall pain. Follow-up PCP.  After history, exam, and medical workup I feel the patient has been appropriately  medically screened and is safe for discharge home. Pertinent diagnoses were discussed with the patient. Patient was given return precautions.  I personally performed the services described in this documentation, which was scribed in my presence. The recorded information has been reviewed and is accurate.   Gregg Baton, MD 03/30/15 [phone]

## 2015-03-29 NOTE — ED Notes (Signed)
C/o constant sharp pain under L breast with sob since yesterday.  Denies nausea and vomiting.  No other associated symptoms.

## 2015-03-30 DIAGNOSIS — R0781 Pleurodynia: Secondary | ICD-10-CM | POA: Diagnosis not present

## 2015-03-30 LAB — D-DIMER, QUANTITATIVE (NOT AT ARMC): D-Dimer, Quant: 0.3 ug/mL-FEU (ref 0.00–0.50)

## 2015-03-30 MED ORDER — NAPROXEN 500 MG PO TABS: 500.0000 mg | ORAL_TABLET | Freq: Two times a day (BID) | ORAL | Status: DC

## 2015-03-30 MED ORDER — OXYCODONE-ACETAMINOPHEN 5-325 MG PO TABS
1.0000 | ORAL_TABLET | Freq: Once | ORAL | Status: AC
  Administered 2015-03-30: 1 via ORAL
  Filled 2015-03-30: qty 1

## 2015-03-30 NOTE — Discharge Instructions (Signed)
Nonspecific Chest Pain  °Chest pain can be caused by many different conditions. There is always a chance that your pain could be related to something serious, such as a heart attack or a blood clot in your lungs. Chest pain can also be caused by conditions that are not life-threatening. If you have chest pain, it is very important to follow up with your health care provider. °CAUSES  °Chest pain can be caused by: °· Heartburn. °· Pneumonia or bronchitis. °· Anxiety or stress. °· Inflammation around your heart (pericarditis) or lung (pleuritis or pleurisy). °· A blood clot in your lung. °· A collapsed lung (pneumothorax). It can develop suddenly on its own (spontaneous pneumothorax) or from trauma to the chest. °· Shingles infection (varicella-zoster virus). °· Heart attack. °· Damage to the bones, muscles, and cartilage that make up your chest wall. This can include: °¨ Bruised bones due to injury. °¨ Strained muscles or cartilage due to frequent or repeated coughing or overwork. °¨ Fracture to one or more ribs. °¨ Sore cartilage due to inflammation (costochondritis). °RISK FACTORS  °Risk factors for chest pain may include: °· Activities that increase your risk for trauma or injury to your chest. °· Respiratory infections or conditions that cause frequent coughing. °· Medical conditions or overeating that can cause heartburn. °· Heart disease or family history of heart disease. °· Conditions or health behaviors that increase your risk of developing a blood clot. °· Having had chicken pox (varicella zoster). °SIGNS AND SYMPTOMS °Chest pain can feel like: °· Burning or tingling on the surface of your chest or deep in your chest. °· Crushing, pressure, aching, or squeezing pain. °· Dull or sharp pain that is worse when you move, cough, or take a deep breath. °· Pain that is also felt in your back, neck, shoulder, or arm, or pain that spreads to any of these areas. °Your chest pain may come and go, or it may stay  constant. °DIAGNOSIS °Lab tests or other studies may be needed to find the cause of your pain. Your health care provider may have you take a test called an ambulatory ECG (electrocardiogram). An ECG records your heartbeat patterns at the time the test is performed. You may also have other tests, such as: °· Transthoracic echocardiogram (TTE). During echocardiography, sound waves are used to create a picture of all of the heart structures and to look at how blood flows through your heart. °· Transesophageal echocardiogram (TEE). This is a more advanced imaging test that obtains images from inside your body. It allows your health care provider to see your heart in finer detail. °· Cardiac monitoring. This allows your health care provider to monitor your heart rate and rhythm in real time. °· Holter monitor. This is a portable device that records your heartbeat and can help to diagnose abnormal heartbeats. It allows your health care provider to track your heart activity for several days, if needed. °· Stress tests. These can be done through exercise or by taking medicine that makes your heart beat more quickly. °· Blood tests. °· Imaging tests. °TREATMENT  °Your treatment depends on what is causing your chest pain. Treatment may include: °· Medicines. These may include: °¨ Acid blockers for heartburn. °¨ Anti-inflammatory medicine. °¨ Pain medicine for inflammatory conditions. °¨ Antibiotic medicine, if an infection is present. °¨ Medicines to dissolve blood clots. °¨ Medicines to treat coronary artery disease. °· Supportive care for conditions that do not require medicines. This may include: °¨ Resting. °¨ Applying heat   or cold packs to injured areas. °¨ Limiting activities until pain decreases. °HOME CARE INSTRUCTIONS °· If you were prescribed an antibiotic medicine, finish it all even if you start to feel better. °· Avoid any activities that bring on chest pain. °· Do not use any tobacco products, including  cigarettes, chewing tobacco, or electronic cigarettes. If you need help quitting, ask your health care provider. °· Do not drink alcohol. °· Take medicines only as directed by your health care provider. °· Keep all follow-up visits as directed by your health care provider. This is important. This includes any further testing if your chest pain does not go away. °· If heartburn is the cause for your chest pain, you may be told to keep your head raised (elevated) while sleeping. This reduces the chance that acid will go from your stomach into your esophagus. °· Make lifestyle changes as directed by your health care provider. These may include: °¨ Getting regular exercise. Ask your health care provider to suggest some activities that are safe for you. °¨ Eating a heart-healthy diet. A registered dietitian can help you to learn healthy eating options. °¨ Maintaining a healthy weight. °¨ Managing diabetes, if necessary. °¨ Reducing stress. °SEEK MEDICAL CARE IF: °· Your chest pain does not go away after treatment. °· You have a rash with blisters on your chest. °· You have a fever. °SEEK IMMEDIATE MEDICAL CARE IF:  °· Your chest pain is worse. °· You have an increasing cough, or you cough up blood. °· You have severe abdominal pain. °· You have severe weakness. °· You faint. °· You have chills. °· You have sudden, unexplained chest discomfort. °· You have sudden, unexplained discomfort in your arms, back, neck, or jaw. °· You have shortness of breath at any time. °· You suddenly start to sweat, or your skin gets clammy. °· You feel nauseous or you vomit. °· You suddenly feel light-headed or dizzy. °· Your heart begins to beat quickly, or it feels like it is skipping beats. °These symptoms may represent a serious problem that is an emergency. Do not wait to see if the symptoms will go away. Get medical help right away. Call your local emergency services (911 in the [place] Do not drive yourself to the hospital. °  °This  information is not intended to replace advice given to you by your health care provider. Make sure you discuss any questions you have with your health care provider. °  °Document Released: 12/29/2004 Document Revised: 04/11/2014 Document Reviewed: 10/25/2013 °Elsevier Interactive Patient Education ©2016 [employer] ° °

## 2015-03-30 NOTE — ED Notes (Signed)
Dr. Horton at bedside at this time.  

## 2015-03-30 NOTE — ED Notes (Signed)
Discharge instructions/prescription reviewed with patient/parents. Understanding verbalized. Patient declined wheelchair at time of discharge. As patient ambulated towards exit, he began to experience mild seizure. Attempted to place patient in room for further evaluation, but mother refused. Reported that patient has seizures often and had not had his midnight dose of seizure medication. Patient placed in wheelchair to be transported to exit - per mother.

## 2015-04-20 ENCOUNTER — Ambulatory Visit (INDEPENDENT_AMBULATORY_CARE_PROVIDER_SITE_OTHER): Payer: PPO | Admitting: Neurology

## 2015-04-20 ENCOUNTER — Encounter: Payer: Self-pay | Admitting: Neurology

## 2015-04-20 VITALS — BP 130/80 | HR 78 | Wt 238.0 lb

## 2015-04-20 DIAGNOSIS — G40211 Localization-related (focal) (partial) symptomatic epilepsy and epileptic syndromes with complex partial seizures, intractable, with status epilepticus: Secondary | ICD-10-CM | POA: Diagnosis not present

## 2015-04-20 NOTE — Progress Notes (Signed)
NEUROLOGY FOLLOW UP OFFICE NOTE  Gregg Young [id number]  HISTORY OF PRESENT ILLNESS: I had the pleasure of seeing Gregg Young in follow-up in the neurology clinic on 04/20/2015.  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 He is again accompanied by his parents who help supplement the history today. He underwent VNS placement last 03/10/15. On his last visit, VNS was turned on to the lowest settings. He was reporting hoarseness that started 3 days after surgery. This has since improved, voice now normal. He was in the ER last 12/25 due to chest pain, he was having 2 days of left-sided chest pain, diagnosed as pleuritic in the ER. No further chest pain. Family reports 3 falls since his last visit, as well as daily small seizures. He has tried using his magnet but has not noticed much difference. He fell and hit his nose on 04/08/14, also fell on 1/6, 1/7, 1/8. When his mother comes to the room, sometimes it takes him a few minutes to get his bearings. He does not recall the last 3 falls, "I don't know what she is talking about." He continues on Onfi 20mg  in AM, 30mg  in PM and Vimpat 50mg  in AM, 100mg  in PM, in addition to Lamictal 200mg  TID, Lyrica 225mg  TID, and Keppra 2500mg  BID. He denies any drowsiness, dizziness, headaches, focal numbness/tingling/weakness.  HPI: This is a 40 yo RH man with a history of seizures since age 71. He has no recollection of events, no prior warning, witnessed by his mother to have a generalized convulsion lasting 90 to 120 seconds. He was brought to [name] then had another convulsion 2 days later. They recall trying different medications, Depakote caused liver dysfunction, he has failed Dilantin and Zonegran. He has been on Keppra, Lamictal, Lyrica, and most recently Vimpat. He had an EMU admission at [facility], records unavailable for review, per notes he stayed for 36 hours and had generalized seizures, multiple foci. They report two  admissions for status epilepticus, one in September 2002 in the setting of weaning off Depakote, and another in January 2003. Records unavailable for review. His last GTC was around 5 years ago. He continued to have "petit mals" several times a day where his eyes would roll back, hands would shake for 30-45 seconds, if standing he would fall and had required sutures and staples in the past. He had been having the "petit mals" several times daily until 3 weeks ago when he started having a different type of episode and the petit mals "completely stopped." He was brought to the ER on 12/07/13 when his parents awoke to him dry having and speaking gibberish. He would be unable to speak, control his limbs, and cannot stand up without assistance. He can hear people around him but his jaw feels tight. The episodes can last for several hours. He has had 4 episodes in the past 3 weeks. The patient reports that they have been occurring only during the weekends, however his parents remind him he had one on Wednesday. He went to his neurologist's office on 09/10 where he had an episode while walking to the exam room where he became dizzy and fell. His father was able to catch him, and they reported his speech became affected. He was noted to be speaking in a whisper throughout the visit. He does note that he becomes more dizzy after taking his medications. Lamictal level was 17.8, Vimpat level 7.3.   The patient lives with  his parents and brother. He is on disability and mostly stays at home playing video games. He graduated high school, no special ed classes. He endorses olfactory hallucinations but cannot describe it except saying they are the same all the time.   Epilepsy Risk Factors: He had a skull fracture on the right side after a fall at 31 months of age. Otherwise he had a normal birth and early development. There is no history of febrile convulsions, CNS infections such as meningitis/encephalitis,  neurosurgical procedures, or family history of seizures.  Prior AEDs: Depakote, Dilantin, Zonegran  EEGs: 48-hour EEG abnormal with multifocal discharges and right temporal slowing. Prior records note "generalized seizures, multiple foci"  MRI: I personally reviewed MRI brain with and without contrast done 12/26/2013 which did not show any acute intracranial abnormality, hippocampi symmetric without abnormal signal or enhancement.  PAST MEDICAL HISTORY: Past Medical History  Diagnosis Date  . History of kidney stones   . Seizures (HCC)     daily    MEDICATIONS: Current Outpatient Prescriptions on File Prior to Visit  Medication Sig Dispense Refill  . cloBAZam (ONFI) 10 MG tablet Take 20-30 mg by mouth 2 (two) times daily. 20mg  in the morning, 30mg  at bedtime    . lacosamide (VIMPAT) 50 MG TABS tablet Take 1 tablet in AM, 2 tablets in PM (Patient taking differently: Take 50-100 mg by mouth 2 (two) times daily. 50mg  in the morning, 100mg  in the evening) 90 tablet 4  . lamoTRIgine (LAMICTAL) 200 MG tablet TAKE ONE TABLET BY MOUTH THREE TIMES DAILY EVERY DAY 90 tablet 11  . levETIRAcetam (KEPPRA) 1000 MG tablet Take 2 & 1/2 tablets BY MOUTH TWICE DAILY 150 tablet 11  . LYRICA 225 MG capsule TAKE 1 CAPSULE BY MOUTH 3 TIMES DAILY 90 capsule 5  . naproxen (NAPROSYN) 500 MG tablet Take 1 tablet (500 mg total) by mouth 2 (two) times daily. 30 tablet 0   No current facility-administered medications on file prior to visit.    ALLERGIES: Allergies  Allergen Reactions  . Penicillins Rash    Has patient had a PCN reaction causing immediate rash, facial/tongue/throat swelling, SOB or lightheadedness with hypotension: No Has patient had a PCN reaction causing severe rash involving mucus membranes or skin necrosis: No Has patient had a PCN reaction that required hospitalization No Has patient had a PCN reaction occurring within the last 10 years: No If all of the above answers are "NO", then may  proceed with Cephalosporin use.     FAMILY HISTORY: Family History  Problem Relation Age of Onset  . Healthy Mother   . Healthy Father     SOCIAL HISTORY: Social History   Social History  . Marital Status: Single    Spouse Name: N/A  . Number of Children: 0  . Years of Education: HS   Occupational History  . Not on file.   Social History Main Topics  . Smoking status: Former Smoker -- 0.50 packs/day for 4 years    Types: Cigarettes    Quit date: 03/02/1985  . Smokeless tobacco: Never Used  . Alcohol Use: No  . Drug Use: No  . Sexual Activity: Not on file   Other Topics Concern  . Not on file   Social History Narrative   Patient is single and lives with his parents.   Patient has a high school education.   Patient is right-handed.   Patient does not have any children.   Patient is on disability.  Patient drinks three sodas daily.    REVIEW OF SYSTEMS: Constitutional: No fevers, chills, or sweats, no generalized fatigue, change in appetite Eyes: No visual changes, double vision, eye pain Ear, nose and throat: No hearing loss, ear pain, nasal congestion, sore throat Cardiovascular: No chest pain, palpitations Respiratory:  No shortness of breath at rest or with exertion, wheezes GastrointestinaI: No nausea, vomiting, diarrhea, abdominal pain, fecal incontinence Genitourinary:  No dysuria, urinary retention or frequency Musculoskeletal:  No neck pain, back pain Integumentary: No rash, pruritus, skin lesions Neurological: as above Psychiatric: No depression, insomnia, anxiety Endocrine: No palpitations, fatigue, diaphoresis, mood swings, change in appetite, change in weight, increased thirst Hematologic/Lymphatic:  No anemia, purpura, petechiae. Allergic/Immunologic: no itchy/runny eyes, nasal congestion, recent allergic reactions, rashes  PHYSICAL EXAM: Filed Vitals:   04/20/15 1317  BP: 130/80  Pulse: 78   General: No acute distress Head:   Normocephalic/atraumatic Neck: supple, no paraspinal tenderness, full range of motion Heart:  Regular rate and rhythm Lungs:  Clear to auscultation bilaterally Back: No paraspinal tenderness Skin/Extremities: No rash, no edema Neurological Exam: alert and oriented to person, place, and time. No aphasia or dysarthria. Fund of knowledge is appropriate.  Recent and remote memory are intact.  Attention and concentration are normal.    Able to name objects and repeat phrases. Cranial nerves: Pupils equal, round, reactive to light. Extraocular movements intact with no nystagmus. Visual fields full. Facial sensation intact. No facial asymmetry. Tongue, uvula, palate midline.  Motor: Bulk and tone normal, muscle strength 5/5 throughout with no pronator drift.  Sensation to light touch intact.  No extinction to double simultaneous stimulation.  Deep tendon reflexes 2+ throughout, toes downgoing.  Finger to nose testing intact.  Gait narrow-based and steady, able to tandem walk adequately.  Romberg negative.  VNS 2nd visit: Output current  0.250 mA --> 0.500 mA Signal Frequency 20 Hz Pulse Width  250 us Signal ON time 30 sec Signal OFF time 5.0 min Magnet output current 0.500 mA --> 0.750 mA Magnet ON time 60 sec Magnet Pulse Width 250 us AutoStim output current 0.375 mA  --> 0.625 mA AutoStim Pulse Width 250 us Tachycardia Detection ON/OFF ON Heartbeat detection sensitivity 3 Perform Verify Heartbeat Detection 0 sec Threshold for AutoStim  40% --> 30%  System Diagnostics Data Patient ID Gregg Young Model ID [id number] Serial # [id number] Implanted 2015-03-10 Communication OK Output Current Status OK Current delivered 0.500 mA Lead Impedance OK Impedance Value 3133 Ohms IFI NO  IMPRESSION: This is a 40 yo RH man with a history multifocal epilepsy. Seizures started at age 40. He reported continued multiple daily episodes of "petit mals" despite high doses of AEDs, currently on 5 AEDs. He underwent VNS  placement last 03/10/15. He is tolerating VNS better, no further hoarseness. VNS settings adjusted, output current increased to 0.5 mA. Continue to use his magnet twice a day to get used to the stimulation. We will do a slow uptitration in stimulation, with follow-up to be scheduled in a month. Continue current AEDs. His family knows to call our office for any problems.   Thank you for allowing me to participate in his care.  Please do not hesitate to call for any questions or concerns.  The duration of this appointment visit was 25 minutes of face-to-face time with the patient.  Greater than 50% of this time was spent in counseling, explanation of diagnosis, planning of further management, and coordination of care.   Patrcia DollyKaren Aquino, M.D.  CC: Dr. Durene Cal

## 2015-04-20 NOTE — Patient Instructions (Signed)
1. Continue all your medications as prescribed 2. Continue with swiping magnet twice a day 3. Continue seizure calendar 4. Follow-up in 1 month, call for any changes

## 2015-05-04 ENCOUNTER — Telehealth: Payer: Self-pay | Admitting: Neurology

## 2015-05-04 NOTE — Telephone Encounter (Signed)
Lmovm to rtn my call. 

## 2015-05-04 NOTE — Telephone Encounter (Signed)
[  pt mother Steward Drone wants to know if we got the prior Auth for the Vimpat or the onsi medciation please call [phone]

## 2015-05-05 NOTE — Telephone Encounter (Signed)
Patients mother/Brenda returned my call. She states that they received a letter from patient's insurance that below medications wouldn't be covered. Told her we haven't received those yet, she is going to fax me a copy of those. Once received I will do the  medication exception authorization for patient to get his meds for the year.

## 2015-05-13 ENCOUNTER — Telehealth: Payer: Self-pay | Admitting: Neurology

## 2015-05-13 NOTE — Telephone Encounter (Signed)
[facility] [phone] called needs to talk to someone about medication  vimpat prior auth

## 2015-05-14 NOTE — Telephone Encounter (Signed)
Once PA forms are completed they will be faxed to [facility].

## 2015-05-18 ENCOUNTER — Encounter: Payer: Self-pay | Admitting: Neurology

## 2015-05-18 ENCOUNTER — Telehealth: Payer: Self-pay | Admitting: Neurology

## 2015-05-18 ENCOUNTER — Ambulatory Visit (INDEPENDENT_AMBULATORY_CARE_PROVIDER_SITE_OTHER): Payer: PPO | Admitting: Neurology

## 2015-05-18 VITALS — BP 122/84 | HR 112 | Wt 236.0 lb

## 2015-05-18 DIAGNOSIS — G40211 Localization-related (focal) (partial) symptomatic epilepsy and epileptic syndromes with complex partial seizures, intractable, with status epilepticus: Secondary | ICD-10-CM | POA: Diagnosis not present

## 2015-05-18 NOTE — Telephone Encounter (Signed)
Called number twice, no representatives available. Will continue to call back.

## 2015-05-18 NOTE — Telephone Encounter (Signed)
Please review

## 2015-05-18 NOTE — Telephone Encounter (Signed)
[facility] called on pre auth - Ref. [id number]  - 17 hours left before this will be closed out.

## 2015-05-18 NOTE — Progress Notes (Signed)
NEUROLOGY FOLLOW UP OFFICE NOTE  Gregg Young [id number]  HISTORY OF PRESENT ILLNESS: I had the pleasure of seeing Gregg Young in follow-up in the neurology clinic on 05/18/2015.  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 He is again accompanied by his fatherwho help supplement the history today. He underwent VNS placement last 03/10/15. On his last visit, VNS output current was increased to 0.72mA.  They bring his seizure calendar, and are happy to report a reduction in seizure frequency from daily small seizures to 2-4 a week, no seizures reported for 2 weeks at the end of January. They deny any falls in the past month. He had one seizure this morning, and also had a brief seizure in the office, he was sitting on the examination table then appeared to have lost upper body tone and swayed to the side with eyes closed for 1-2 seconds, he opened his eyes and was amnestic of the episode. He was able to swipe his magnet right after. He continues on Onfi  in AM,  in PM and Vimpat  in AM,  in PM, in addition to Lamictal  TID, Lyrica  TID, and Keppra  BID. He denies any drowsiness, dizziness, headaches, focal numbness/tingling/weakness.  HPI: [name] He has no recollection of events, no prior warning, witnessed by his mother to have a generalized convulsion lasting 90 to 120 seconds. He was brought to [facility] then had another convulsion 2 days later. They recall trying different medications, Depakote caused liver dysfunction, he has failed Dilantin and Zonegran. He has been on Keppra, Lamictal, Lyrica, and most recently Vimpat. He had an EMU admission at [facility], records unavailable for review, per notes he stayed for 36 hours and had generalized seizures, multiple foci. They report two admissions for status epilepticus, one in September 2002 in the setting of weaning off Depakote, and  another in January 2003. Records unavailable for review. His last GTC was around 5 years ago. He continued to have "petit mals" several times a day where his eyes would roll back, hands would shake for 30-45 seconds, if standing he would fall and had required sutures and staples in the past. He had been having the "petit mals" several times daily until 3 weeks ago when he started having a different type of episode and the petit mals "completely stopped." He was brought to the ER on 12/07/13 when his parents awoke to him dry having and speaking gibberish. He would be unable to speak, control his limbs, and cannot stand up without assistance. He can hear people around him but his jaw feels tight. The episodes can last for several hours. He has had 4 episodes in the past 3 weeks. The patient reports that they have been occurring only during the weekends, however his parents remind him he had one on Wednesday. He went to his neurologist's office on 09/10 where he had an episode while walking to the exam room where he became dizzy and fell. His father was able to catch him, and they reported his speech became affected. He was noted to be speaking in a whisper throughout the visit. He does note that he becomes more dizzy after taking his medications. Lamictal level was 17.8, Vimpat level 7.3.   The patient lives with his parents and brother. He is on disability and mostly stays at home playing video games. He graduated high school,  no special ed classes. He endorses olfactory hallucinations but cannot describe it except saying they are the same all the time.   Epilepsy Risk Factors: He had a skull fracture on the right side after a fall at 20 months of age. Otherwise he had a normal birth and early development. There is no history of febrile convulsions, CNS infections such as meningitis/encephalitis, neurosurgical procedures, or family history of seizures.  Prior AEDs: Depakote, Dilantin, Zonegran  EEGs:  48-hour EEG abnormal with multifocal discharges and right temporal slowing. Prior records note "generalized seizures, multiple foci"  MRI: I personally reviewed MRI brain with and without contrast done 12/26/2013 which did not show any acute intracranial abnormality, hippocampi symmetric without abnormal signal or enhancement.  PAST MEDICAL HISTORY: Past Medical History  Diagnosis Date  . History of kidney stones   . Seizures (HCC)     daily    MEDICATIONS: Current Outpatient Prescriptions on File Prior to Visit  Medication Sig Dispense Refill  . cloBAZam (ONFI) 10 MG tablet Take 20-30 mg by mouth 2 (two) times daily. 20mg  in the morning, 30mg  at bedtime    . lacosamide (VIMPAT) 50 MG TABS tablet Take 1 tablet in AM, 2 tablets in PM (Patient taking differently: Take 50-100 mg by mouth 2 (two) times daily. 50mg  in the morning, 100mg  in the evening) 90 tablet 4  . lamoTRIgine (LAMICTAL) 200 MG tablet TAKE ONE TABLET BY MOUTH THREE TIMES DAILY EVERY DAY 90 tablet 11  . levETIRAcetam (KEPPRA) 1000 MG tablet Take 2 & 1/2 tablets BY MOUTH TWICE DAILY 150 tablet 11  . LYRICA 225 MG capsule TAKE 1 CAPSULE BY MOUTH 3 TIMES DAILY 90 capsule 5  . naproxen (NAPROSYN) 500 MG tablet Take 1 tablet (500 mg total) by mouth 2 (two) times daily. 30 tablet 0   No current facility-administered medications on file prior to visit.    ALLERGIES: Allergies  Allergen Reactions  . Penicillins Rash    Has patient had a PCN reaction causing immediate rash, facial/tongue/throat swelling, SOB or lightheadedness with hypotension: No Has patient had a PCN reaction causing severe rash involving mucus membranes or skin necrosis: No Has patient had a PCN reaction that required hospitalization No Has patient had a PCN reaction occurring within the last 10 years: No If all of the above answers are "NO", then may proceed with Cephalosporin use.     FAMILY HISTORY: Family History  Problem Relation Age of Onset  .  Healthy Mother   . Healthy Father     SOCIAL HISTORY: Social History   Social History  . Marital Status: Single    Spouse Name: N/A  . Number of Children: 0  . Years of Education: HS   Occupational History  . Not on file.   Social History Main Topics  . Smoking status: Former Smoker -- 0.50 packs/day for 4 years    Types: Cigarettes    Quit date: 03/02/1985  . Smokeless tobacco: Never Used  . Alcohol Use: No  . Drug Use: No  . Sexual Activity: Not on file   Other Topics Concern  . Not on file   Social History Narrative   Patient is single and lives with his parents.   Patient has a high school education.   Patient is right-handed.   Patient does not have any children.   Patient is on disability.   Patient drinks three sodas daily.    REVIEW OF SYSTEMS: Constitutional: No fevers, chills, or sweats, no generalized  fatigue, change in appetite Eyes: No visual changes, double vision, eye pain Ear, nose and throat: No hearing loss, ear pain, nasal congestion, sore throat Cardiovascular: No chest pain, palpitations Respiratory:  No shortness of breath at rest or with exertion, wheezes GastrointestinaI: No nausea, vomiting, diarrhea, abdominal pain, fecal incontinence Genitourinary:  No dysuria, urinary retention or frequency Musculoskeletal:  No neck pain, back pain Integumentary: No rash, pruritus, skin lesions Neurological: as above Psychiatric: No depression, insomnia, anxiety Endocrine: No palpitations, fatigue, diaphoresis, mood swings, change in appetite, change in weight, increased thirst Hematologic/Lymphatic:  No anemia, purpura, petechiae. Allergic/Immunologic: no itchy/runny eyes, nasal congestion, recent allergic reactions, rashes  PHYSICAL EXAM: Filed Vitals:   05/18/15 1543  BP: 122/84  Pulse: 112   General: No acute distress Head:  Normocephalic/atraumatic Neck: supple, no paraspinal tenderness, full range of motion Heart:  Regular rate and  rhythm Lungs:  Clear to auscultation bilaterally Back: No paraspinal tenderness Skin/Extremities: No rash, no edema Neurological Exam: alert and oriented to person, place, and time. No aphasia or dysarthria. Fund of knowledge is appropriate.  Recent and remote memory are intact. 3/3 delayed recall. Attention and concentration are normal.    Able to name objects and repeat phrases. Cranial nerves: Pupils equal, round, reactive to light. Extraocular movements intact with no nystagmus. Visual fields full. Facial sensation intact. No facial asymmetry. Tongue, uvula, palate midline.  Motor: Bulk and tone normal, muscle strength 5/5 throughout with no pronator drift.  Sensation to light touch intact.  No extinction to double simultaneous stimulation.  Deep tendon reflexes 2+ throughout, toes downgoing.  Finger to nose testing intact.  Gait narrow-based and steady, able to tandem walk adequately.  Romberg negative.  VNS Therapy Management: Unit Information Implant Date: 03/10/15 Serial Number: [id number] Generator Number: 106 Parameters Output Current (mA): 0.625 (changed from 0.500) Signal Frequency (Hz): 20 Pulse Width (usec): 250 Signal ON Time (sec): 30 Signal OFF Time (min): 5.0 Magnet Output Current (mA): 0.875 (changed from 0.750) Magnet ON Time (sec): 60 Magnet Pulse Width (usec): 250 AutoStim Output Current (mA): 0.750 (changed from 0.625) AutoStim Pulse Width (usec): 250 AutoStim ON Time (sec): 60 Tachycardia Detection : On Heartbeat Detection Sensitivity: 3 Threshold for AutoStim (%): 30 Diagnostics Output Current: OK Current Delievered (mA): 0.625 Lead Impedance: OK Impedence Value (Ohms): 3013 Impact on seizures  Average AutoStims/Day: 50  IMPRESSION: This is a 40 yo RH man with a history multifocal epilepsy. Seizures started at age 21. He reported continued multiple daily episodes of "petit mals" despite high doses of AEDs, currently on 5 AEDs. He underwent VNS placement last  03/10/15. He is tolerating VNS, output current increased today. They are happy to report a reduction in seizure frequency, with only 2 this week, after reporting small seizures on a daily basis. He had a brief seizure in the office today that he was amnestic of, able to swipe his magnet after. He tolerated change in settings in the office. Continue to use his magnet twice a day to get used to the stimulation. We will continue with uptitration in stimulation, with follow-up to be scheduled in a month. Continue current AEDs. His family knows to call our office for any problems.   Thank you for allowing me to participate in his care.  Please do not hesitate to call for any questions or concerns.  The duration of this appointment visit was 25 minutes of face-to-face time with the patient.  Greater than 50% of this time was spent in counseling, explanation  of diagnosis, planning of further management, and coordination of care.   Patrcia Dolly, M.D.   CC: Dr. Durene Cal

## 2015-05-18 NOTE — Telephone Encounter (Signed)
Tried calling again twice, no available representatives. Left VM

## 2015-05-18 NOTE — Patient Instructions (Signed)
1. Continue all your medications 2. Continue to swipe magnet twice a day 3. Follow-up in 1 month, call for any problems  Seizure Precautions: 1. If medication has been prescribed for you to prevent seizures, take it exactly as directed.  Do not stop taking the medicine without talking to your doctor first, even if you have not had a seizure in a long time.   2. Avoid activities in which a seizure would cause danger to yourself or to others.  Don't operate dangerous machinery, swim alone, or climb in high or dangerous places, such as on ladders, roofs, or girders.  Do not drive unless your doctor says you may.  3. If you have any warning that you may have a seizure, lay down in a safe place where you can't hurt yourself.    4.  No driving for 6 months from last seizure, as per [facility].   Please refer to the following link on the Epilepsy Foundation of America's website for more information: http://www.epilepsyfoundation.org/answerplace/Social/driving/drivingu.cfm   5.  Maintain good sleep hygiene. Avoid alcohol  6.  Contact your doctor if you have any problems that may be related to the medicine you are taking.  7.  Call 911 and bring the patient back to the ED if:        A.  The seizure lasts longer than 5 minutes.       B.  The patient doesn't awaken shortly after the seizure  C.  The patient has new problems such as difficulty seeing, speaking or moving  D.  The patient was injured during the seizure  E.  The patient has a temperature over 102 F (39C)  F.  The patient vomited and now is having trouble breathing

## 2015-05-19 ENCOUNTER — Telehealth: Payer: Self-pay | Admitting: Neurology

## 2015-05-19 NOTE — Telephone Encounter (Signed)
Returned call. She wanted to make [place] aware that the patients Vimpat wasn't approved by his insurance. I did tell her we were aware and that we are working on getting it approved through appeal.

## 2015-05-19 NOTE — Telephone Encounter (Signed)
Noted  

## 2015-05-19 NOTE — Telephone Encounter (Signed)
Again no answer on [phone]. Called [employer] number [id number] and spoke to representative, the account is open, meaning that they are reviewing it (likely received the urgent fax we sent yesterday). We should be hearing back by fax by tomorrow. If still no word, agent recommends calling again tomorrow to f/u.

## 2015-05-19 NOTE — Telephone Encounter (Signed)
[employer] called and states that she sent a fax this morning to [place] about a denial for medication please call [phone] ext [phone]

## 2015-05-20 NOTE — Telephone Encounter (Signed)
Fax received from [employer] this morning. Vimpat 50 mg was approved for patient from 05/19/15-04/03/16. Patient's family was notified.

## 2015-06-17 ENCOUNTER — Encounter: Payer: Self-pay | Admitting: Neurology

## 2015-06-17 ENCOUNTER — Ambulatory Visit (INDEPENDENT_AMBULATORY_CARE_PROVIDER_SITE_OTHER): Payer: PPO | Admitting: Neurology

## 2015-06-17 VITALS — BP 120/84 | HR 86 | Resp 16 | Wt 238.0 lb

## 2015-06-17 DIAGNOSIS — G40211 Localization-related (focal) (partial) symptomatic epilepsy and epileptic syndromes with complex partial seizures, intractable, with status epilepticus: Secondary | ICD-10-CM

## 2015-06-17 NOTE — Patient Instructions (Signed)
Looking good! Continue all your current medications. Continue seizure calendar. Follow-up in 1 month, call for any changes.  Seizure Precautions: 1. If medication has been prescribed for you to prevent seizures, take it exactly as directed.  Do not stop taking the medicine without talking to your doctor first, even if you have not had a seizure in a long time.   2. Avoid activities in which a seizure would cause danger to yourself or to others.  Don't operate dangerous machinery, swim alone, or climb in high or dangerous places, such as on ladders, roofs, or girders.  Do not drive unless your doctor says you may.  3. If you have any warning that you may have a seizure, lay down in a safe place where you can't hurt yourself.    4.  No driving for 6 months from last seizure, [place]   Please refer to the following link on the Epilepsy Foundation of America's website for more information: http://www.epilepsyfoundation.org/answerplace/Social/driving/drivingu.cfm   5.  Maintain good sleep hygiene. Avoid alcohol.  6.  Contact your doctor if you have any problems that may be related to the medicine you are taking.  7.  Call 911 and bring the patient back to the ED if:        A.  The seizure lasts longer than 5 minutes.       B.  The patient doesn't awaken shortly after the seizure  C.  The patient has new problems such as difficulty seeing, speaking or moving  D.  The patient was injured during the seizure  E.  The patient has a temperature over 102 F (39C)  F.  The patient vomited and now is having trouble breathing

## 2015-06-17 NOTE — Progress Notes (Signed)
NEUROLOGY FOLLOW UP OFFICE NOTE  Gregg Young [id number]  HISTORY OF PRESENT ILLNESS: I had the pleasure of seeing Gregg Young in follow-up in the neurology clinic on 06/17/2015.  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 He is again accompanied by his father who helps supplement the history today. He underwent VNS placement last 03/10/15. On his last visit, VNS output current was increased to 0.625 mA. He continues to tolerate VNS without any further voice changes. They bring his seizure calendar, he is doing better, he had 1-3 seizures from 2/20 to 2/24, then was seizure-free until 3/1 and 3/12. They deny any falls in the past 2 months. He continues on Onfi  in AM,  in PM and Vimpat  in AM,  in PM, in addition to Lamictal  TID, Lyrica  TID, and Keppra  BID. He denies any drowsiness, dizziness, headaches, focal numbness/tingling/weakness. His father reports sleeping habits are better.  HPI: This is a 40 yo RH man with a history of seizures since age 10. He has no recollection of events, no prior warning, witnessed by his mother to have a generalized convulsion lasting 90 to 120 seconds. He was brought to [facility] then had another convulsion 2 days later. They recall trying different medications, Depakote caused liver dysfunction, he has failed Dilantin and Zonegran. He has been on Keppra, Lamictal, Lyrica, and most recently Vimpat. He had an EMU admission at [facility], records unavailable for review, per notes he stayed for 36 hours and had generalized seizures, multiple foci. They report two admissions for status epilepticus, one in September 2002 in the setting of weaning off Depakote, and another in January 2003. Records unavailable for review. His last GTC was around 5 years ago. He continued to have "petit mals" several times a day where his eyes would roll back, hands would shake for 30-45 seconds, if standing he would fall and  had required sutures and staples in the past. He had been having the "petit mals" several times daily until 3 weeks ago when he started having a different type of episode and the petit mals "completely stopped." He was brought to the ER on 12/07/13 when his parents awoke to him dry having and speaking gibberish. He would be unable to speak, control his limbs, and cannot stand up without assistance. He can hear people around him but his jaw feels tight. The episodes can last for several hours. He has had 4 episodes in the past 3 weeks. The patient reports that they have been occurring only during the weekends, however his parents remind him he had one on Wednesday. He went to his neurologist's office on 09/10 where he had an episode while walking to the exam room where he became dizzy and fell. His father was able to catch him, and they reported his speech became affected. He was noted to be speaking in a whisper throughout the visit. He does note that he becomes more dizzy after taking his medications. Lamictal level was 17.8, Vimpat level 7.3.   The patient lives with his parents and brother. He is on disability and mostly stays at home playing video games. He graduated high school, no special ed classes. He endorses olfactory hallucinations but cannot describe it except saying they are the same all the time.   Epilepsy Risk Factors: He had a skull fracture on the right side after a fall at 22 months of age. Otherwise he had a normal birth and early  development. There is no history of febrile convulsions, CNS infections such as meningitis/encephalitis, neurosurgical procedures, or family history of seizures.  Prior AEDs: Depakote, Dilantin, Zonegran  EEGs: 48-hour EEG abnormal with multifocal discharges and right temporal slowing. Prior records note "generalized seizures, multiple foci"  MRI: I personally reviewed MRI brain with and without contrast done 12/26/2013 which did not show any acute  intracranial abnormality, hippocampi symmetric without abnormal signal or enhancement.  PAST MEDICAL HISTORY: Past Medical History  Diagnosis Date  . History of kidney stones   . Seizures (HCC)     daily    MEDICATIONS: Current Outpatient Prescriptions on File Prior to Visit  Medication Sig Dispense Refill  . cloBAZam (ONFI) 10 MG tablet Take 20-30 mg by mouth 2 (two) times daily.  in the morning,  at bedtime    . lacosamide (VIMPAT) 50 MG TABS tablet Take 1 tablet in AM, 2 tablets in PM (Patient taking differently: Take 50-100 mg by mouth 2 (two) times daily.  in the morning,  in the evening) 90 tablet 4  . lamoTRIgine (LAMICTAL) 200 MG tablet TAKE ONE TABLET BY MOUTH THREE TIMES DAILY EVERY DAY 90 tablet 11  . levETIRAcetam (KEPPRA) 1000 MG tablet Take 2 & 1/2 tablets BY MOUTH TWICE DAILY 150 tablet 11  . LYRICA 225 MG capsule TAKE 1 CAPSULE BY MOUTH 3 TIMES DAILY 90 capsule 5  . naproxen (NAPROSYN) 500 MG tablet Take 1 tablet (500 mg total) by mouth 2 (two) times daily. 30 tablet 0   No current facility-administered medications on file prior to visit.    ALLERGIES: Allergies  Allergen Reactions  . Penicillins Rash    Has patient had a PCN reaction causing immediate rash, facial/tongue/throat swelling, SOB or lightheadedness with hypotension: No Has patient had a PCN reaction causing severe rash involving mucus membranes or skin necrosis: No Has patient had a PCN reaction that required hospitalization No Has patient had a PCN reaction occurring within the last 10 years: No If all of the above answers are "NO", then may proceed with Cephalosporin use.     FAMILY HISTORY: Family History  Problem Relation Age of Onset  . Healthy Mother   . Healthy Father     SOCIAL HISTORY: Social History   Social History  . Marital Status: Single    Spouse Name: N/A  . Number of Children: 0  . Years of Education: HS   Occupational History  . Not on file.   Social  History Main Topics  . Smoking status: Former Smoker -- 0.50 packs/day for 4 years    Types: Cigarettes    Quit date: 03/02/1985  . Smokeless tobacco: Never Used  . Alcohol Use: No  . Drug Use: No  . Sexual Activity: Not on file   Other Topics Concern  . Not on file   Social History Narrative   Patient is single and lives with his parents.   Patient has a high school education.   Patient is right-handed.   Patient does not have any children.   Patient is on disability.   Patient drinks three sodas daily.    REVIEW OF SYSTEMS: Constitutional: No fevers, chills, or sweats, no generalized fatigue, change in appetite Eyes: No visual changes, double vision, eye pain Ear, nose and throat: No hearing loss, ear pain, nasal congestion, sore throat Cardiovascular: No chest pain, palpitations Respiratory:  No shortness of breath at rest or with exertion, wheezes GastrointestinaI: No nausea, vomiting, diarrhea, abdominal pain, fecal incontinence  Genitourinary:  No dysuria, urinary retention or frequency Musculoskeletal:  No neck pain, back pain Integumentary: No rash, pruritus, skin lesions Neurological: as above Psychiatric: No depression, insomnia, anxiety Endocrine: No palpitations, fatigue, diaphoresis, mood swings, change in appetite, change in weight, increased thirst Hematologic/Lymphatic:  No anemia, purpura, petechiae. Allergic/Immunologic: no itchy/runny eyes, nasal congestion, recent allergic reactions, rashes  PHYSICAL EXAM: Filed Vitals:   06/17/15 1539  BP: 120/84  Pulse: 86  Resp: 16   General: No acute distress Head:  Normocephalic/atraumatic Neck: supple, no paraspinal tenderness, full range of motion Heart:  Regular rate and rhythm Lungs:  Clear to auscultation bilaterally Back: No paraspinal tenderness Skin/Extremities: No rash, no edema Neurological Exam: alert and oriented to person, place, and time. No aphasia or dysarthria. Fund of knowledge is  appropriate.  Recent and remote memory are intact. Attention and concentration are normal.    Able to name objects and repeat phrases. Cranial nerves: Pupils equal, round, reactive to light. Extraocular movements intact with no nystagmus. Visual fields full. Facial sensation intact. No facial asymmetry. Tongue, uvula, palate midline.  Motor: Bulk and tone normal, muscle strength 5/5 throughout with no pronator drift.  Sensation to light touch intact.  No extinction to double simultaneous stimulation.  Deep tendon reflexes 2+ throughout, toes downgoing.  Finger to nose testing intact.  Gait narrow-based and steady, able to tandem walk adequately.  Romberg negative.  VNS Therapy Management: Parameters Output Current (mA): 0.750 Signal Frequency (Hz): 20 Pulse Width (usec): 250 Signal ON Time (sec): 30 Signal OFF Time (min): 5.0 Magnet Output Current (mA): 1.000 Magnet ON Time (sec): 60 Magnet Pulse Width (usec): 250 AutoStim Output Current (mA): 0.875 AutoStim Pulse Width (usec): 250 AutoStim ON Time (sec): 60 Tachycardia Detection : On Heartbeat Detection Sensitivity: 3 Threshold for AutoStim (%): 30 Diagnostics Output Current: ok Current Delievered (mA): 0.750 Lead Impedance: OK Impedence Value (Ohms): 2853 Battery Status Indicator (color): Green   IMPRESSION: This is a 40 yo RH man with a history multifocal epilepsy. Seizures started at age 40. He reported continued multiple daily episodes of "petit mals" despite high doses of AEDs, currently on 5 AEDs. He underwent VNS placement last 03/10/15. He is tolerating VNS, output current again increased today. They are happy to continue to report a reduction in seizure frequency, after reporting small seizures on a daily basis, he has had 2 in the past 2 weeks. He is tolerating VNS well and output current was increased to 0.750 today without any difficulties. We will continue with uptitration in stimulation, with follow-up to be scheduled in a  month. Continue current AEDs. His family knows to call our office for any problems.   Thank you for allowing me to participate in his care.  Please do not hesitate to call for any questions or concerns.  The duration of this appointment visit was 25 minutes of face-to-face time with the patient.  Greater than 50% of this time was spent in counseling, explanation of diagnosis, planning of further management, and coordination of care.   Patrcia DollyKaren Aquino, M.D.   CC: Dr. Durene CalHunter

## 2015-06-18 ENCOUNTER — Encounter: Payer: Self-pay | Admitting: Neurology

## 2015-07-22 ENCOUNTER — Ambulatory Visit: Payer: PPO | Admitting: Neurology

## 2015-07-24 ENCOUNTER — Telehealth: Payer: Self-pay | Admitting: Neurology

## 2015-07-24 NOTE — Telephone Encounter (Signed)
Lmovm on vm to return my call.

## 2015-07-24 NOTE — Telephone Encounter (Signed)
PT's mother Steward DroneBrenda called and said she had a missed call but I see where no one called and his appointment is on 08/11/2015 so not sure if it was the automated system or not/Dawn CB# [phone]

## 2015-08-03 ENCOUNTER — Other Ambulatory Visit: Payer: Self-pay | Admitting: Neurology

## 2015-08-03 ENCOUNTER — Other Ambulatory Visit: Payer: Self-pay | Admitting: Family Medicine

## 2015-08-03 MED ORDER — CLOBAZAM 10 MG PO TABS: ORAL_TABLET | ORAL | Status: DC

## 2015-08-11 ENCOUNTER — Ambulatory Visit: Payer: PPO | Admitting: Neurology

## 2015-08-24 ENCOUNTER — Other Ambulatory Visit: Payer: Self-pay | Admitting: Neurology

## 2015-08-24 NOTE — Telephone Encounter (Signed)
06/17/15 10/27/15 

## 2015-08-31 ENCOUNTER — Other Ambulatory Visit: Payer: Self-pay | Admitting: Neurology

## 2015-09-01 ENCOUNTER — Other Ambulatory Visit: Payer: Self-pay | Admitting: Family Medicine

## 2015-09-01 DIAGNOSIS — G40211 Localization-related (focal) (partial) symptomatic epilepsy and epileptic syndromes with complex partial seizures, intractable, with status epilepticus: Secondary | ICD-10-CM

## 2015-09-01 MED ORDER — LACOSAMIDE 50 MG PO TABS: 50.0000 mg | ORAL_TABLET | Freq: Two times a day (BID) | ORAL | Status: DC

## 2015-10-26 ENCOUNTER — Other Ambulatory Visit: Payer: Self-pay | Admitting: Neurology

## 2015-10-26 NOTE — Telephone Encounter (Signed)
Ok to refill, thanks

## 2015-10-26 NOTE — Telephone Encounter (Signed)
Last OV 06/17/15... Last filled on 08/24/15 #90 with 1 refill... Okay to refill?

## 2015-10-27 ENCOUNTER — Encounter: Payer: Self-pay | Admitting: Neurology

## 2015-10-27 ENCOUNTER — Ambulatory Visit (INDEPENDENT_AMBULATORY_CARE_PROVIDER_SITE_OTHER): Payer: PPO | Admitting: Neurology

## 2015-10-27 VITALS — BP 120/86 | HR 85 | Ht 66.0 in | Wt 238.1 lb

## 2015-10-27 DIAGNOSIS — G40211 Localization-related (focal) (partial) symptomatic epilepsy and epileptic syndromes with complex partial seizures, intractable, with status epilepticus: Secondary | ICD-10-CM | POA: Diagnosis not present

## 2015-10-27 NOTE — Patient Instructions (Addendum)
1. Continue all your medications 2. Swipe magnet twice a day 3. Follow-up in 3 months, call for any changes  Seizure Precautions: 1. If medication has been prescribed for you to prevent seizures, take it exactly as directed.  Do not stop taking the medicine without talking to your doctor first, even if you have not had a seizure in a long time.   2. Avoid activities in which a seizure would cause danger to yourself or to others.  Don't operate dangerous machinery, swim alone, or climb in high or dangerous places, such as on ladders, roofs, or girders.  Do not drive unless your doctor says you may.  3. If you have any warning that you may have a seizure, lay down in a safe place where you can't hurt yourself.    4.  No driving for 6 months from last seizure, as per [facility].   Please refer to the following link on the Epilepsy Foundation of America's website for more information: http://www.epilepsyfoundation.org/answerplace/Social/driving/drivingu.cfm   5.  Maintain good sleep hygiene. Avoid alcohol.  6.  Contact your doctor if you have any problems that may be related to the medicine you are taking.  7.  Call 911 and bring the patient back to the ED if:        A.  The seizure lasts longer than 5 minutes.       B.  The patient doesn't awaken shortly after the seizure  C.  The patient has new problems such as difficulty seeing, speaking or moving  D.  The patient was injured during the seizure  E.  The patient has a temperature over 102 F (39C)  F.  The patient vomited and now is having trouble breathing

## 2015-10-27 NOTE — Progress Notes (Signed)
NEUROLOGY FOLLOW UP OFFICE NOTE  Gregg Young [id number]  HISTORY OF PRESENT ILLNESS: I had the pleasure of seeing Gregg Young in follow-up in the neurology clinic on 10/27/2015.  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 He is again accompanied by his father who helps supplement the history today. He underwent VNS placement last 03/10/15. On his last visit, VNS output current was increased to 0.750 mA. Since then, his father reports seizures are unchanged, he has around 1-3 a month. He is not having seizures on a daily basis, but sometimes would cluster with 2 to 3 in a row. His mother has used the magnet during these times as Gregg Young is unaware of them. He fell the other day, his mother had to grab him. He reports sleep is good. Mood is still up and down. He continues on Onfi 20mg  in AM, 30mg  in PM and Vimpat 50mg  in AM, 100mg  in PM, in addition to Lamictal 200mg  TID, Lyrica 225mg  TID, and Keppra 2500mg  BID. He denies any drowsiness, dizziness, headaches, focal numbness/tingling/weakness.   HPI: This is a 39 yo RH man with a history of seizures since age 13. He has no recollection of events, no prior warning, witnessed by his mother to have a generalized convulsion lasting 90 to 120 seconds. He was brought to [facility] then had another convulsion 2 days later. They recall trying different medications, Depakote caused liver dysfunction, he has failed Dilantin and Zonegran. He has been on Keppra, Lamictal, Lyrica, and most recently Vimpat. He had an EMU admission at [facility], records unavailable for review, per notes he stayed for 36 hours and had generalized seizures, multiple foci. They report two admissions for status epilepticus, one in September 2002 in the setting of weaning off Depakote, and another in January 2003. Records unavailable for review. His last GTC was around 5 years ago. He continued to have "petit mals" several times a day where his eyes would  roll back, hands would shake for 30-45 seconds, if standing he would fall and had required sutures and staples in the past. He had been having the "petit mals" several times daily until 3 weeks ago when he started having a different type of episode and the petit mals "completely stopped." He was brought to the ER on 12/07/13 when his parents awoke to him dry having and speaking gibberish. He would be unable to speak, control his limbs, and cannot stand up without assistance. He can hear people around him but his jaw feels tight. The episodes can last for several hours. He has had 4 episodes in the past 3 weeks. The patient reports that they have been occurring only during the weekends, however his parents remind him he had one on Wednesday. He went to his neurologist's office on 09/10 where he had an episode while walking to the exam room where he became dizzy and fell. His father was able to catch him, and they reported his speech became affected. He was noted to be speaking in a whisper throughout the visit. He does note that he becomes more dizzy after taking his medications. Lamictal level was 17.8, Vimpat level 7.3.   The patient lives with his parents and brother. He is on disability and mostly stays at home playing video games. He graduated high school, no special ed classes. He endorses olfactory hallucinations but cannot describe it except saying they are the same all the time.   Epilepsy Risk Factors: He had a  skull fracture on the right side after a fall at 79 months of age. Otherwise he had a normal birth and early development. There is no history of febrile convulsions, CNS infections such as meningitis/encephalitis, neurosurgical procedures, or family history of seizures.  Prior AEDs: Depakote, Dilantin, Zonegran  EEGs: 48-hour EEG abnormal with multifocal discharges and right temporal slowing. Prior records note "generalized seizures, multiple foci"  MRI: I personally reviewed MRI  brain with and without contrast done 12/26/2013 which did not show any acute intracranial abnormality, hippocampi symmetric without abnormal signal or enhancement.  PAST MEDICAL HISTORY: Past Medical History:  Diagnosis Date  . History of kidney stones   . Seizures (HCC)    daily    MEDICATIONS: Current Outpatient Prescriptions on File Prior to Visit  Medication Sig Dispense Refill  . cloBAZam (ONFI) 10 MG tablet Take 2 tablets every morning and 3 tablets at bedtime 150 tablet 5  . lacosamide (VIMPAT) 50 MG TABS tablet Take 1-2 tablets (50-100 mg total) by mouth 2 (two) times daily.  in the morning,  in the evening 90 tablet 5  . lamoTRIgine (LAMICTAL) 200 MG tablet TAKE ONE TABLET BY MOUTH THREE TIMES DAILY EVERY DAY 90 tablet 11  . levETIRAcetam (KEPPRA) 1000 MG tablet Take 2 & 1/2 tablets BY MOUTH TWICE DAILY 150 tablet 11  . LYRICA 225 MG capsule TAKE 1 CAPSULE BY MOUTH 3 TIMES DAILY 90 capsule 2  . naproxen (NAPROSYN) 500 MG tablet Take 1 tablet (500 mg total) by mouth 2 (two) times daily. 30 tablet 0   No current facility-administered medications on file prior to visit.     ALLERGIES: Allergies  Allergen Reactions  . Penicillins Rash    Has patient had a PCN reaction causing immediate rash, facial/tongue/throat swelling, SOB or lightheadedness with hypotension: No Has patient had a PCN reaction causing severe rash involving mucus membranes or skin necrosis: No Has patient had a PCN reaction that required hospitalization No Has patient had a PCN reaction occurring within the last 10 years: No If all of the above answers are "NO", then may proceed with Cephalosporin use.     FAMILY HISTORY: Family History  Problem Relation Age of Onset  . Healthy Mother   . Healthy Father     SOCIAL HISTORY: Social History   Social History  . Marital status: Single    Spouse name: N/A  . Number of children: 0  . Years of education: HS   Occupational History  . Not on  file.   Social History Main Topics  . Smoking status: Former Smoker    Packs/day: 0.50    Years: 4.00    Types: Cigarettes    Quit date: 03/02/1985  . Smokeless tobacco: Never Used  . Alcohol use No  . Drug use: No  . Sexual activity: Not on file   Other Topics Concern  . Not on file   Social History Narrative   Patient is single and lives with his parents.   Patient has a high school education.   Patient is right-handed.   Patient does not have any children.   Patient is on disability.   Patient drinks three sodas daily.    REVIEW OF SYSTEMS: Constitutional: No fevers, chills, or sweats, no generalized fatigue, change in appetite Eyes: No visual changes, double vision, eye pain Ear, nose and throat: No hearing loss, ear pain, nasal congestion, sore throat Cardiovascular: No chest pain, palpitations Respiratory:  No shortness of breath at rest or  with exertion, wheezes GastrointestinaI: No nausea, vomiting, diarrhea, abdominal pain, fecal incontinence Genitourinary:  No dysuria, urinary retention or frequency Musculoskeletal:  No neck pain, back pain Integumentary: No rash, pruritus, skin lesions Neurological: as above Psychiatric: No depression, insomnia, anxiety Endocrine: No palpitations, fatigue, diaphoresis, mood swings, change in appetite, change in weight, increased thirst Hematologic/Lymphatic:  No anemia, purpura, petechiae. Allergic/Immunologic: no itchy/runny eyes, nasal congestion, recent allergic reactions, rashes  PHYSICAL EXAM: Vitals:   10/27/15 1531  BP: 120/86  Pulse: 85   General: No acute distress Head:  Normocephalic/atraumatic Neck: supple, no paraspinal tenderness, full range of motion Heart:  Regular rate and rhythm Lungs:  Clear to auscultation bilaterally Back: No paraspinal tenderness Skin/Extremities: No rash, no edema Neurological Exam: alert and oriented to person, place, and time. No aphasia or dysarthria. Fund of knowledge is  appropriate.  Recent and remote memory are intact. Attention and concentration are normal.    Able to name objects and repeat phrases. Cranial nerves: Pupils equal, round, reactive to light. Extraocular movements intact with no nystagmus. Visual fields full. Facial sensation intact. No facial asymmetry. Tongue, uvula, palate midline.  Motor: Bulk and tone normal, muscle strength 5/5 throughout with no pronator drift.  Sensation to light touch intact.  No extinction to double simultaneous stimulation.  Deep tendon reflexes 2+ throughout, toes downgoing.  Finger to nose testing intact.  Gait narrow-based and steady, able to tandem walk adequately.  Romberg negative.  VNS Therapy Management:VNS Therapy Management: Parameters Output Current (mA): 1.000 Signal Frequency (Hz): 20 Pulse Width (usec): 250 Signal ON Time (sec): 30 Signal OFF Time (min): 5 Magnet Output Current (mA): 1.250 Magnet ON Time (sec): 60 Magnet Pulse Width (usec): 250 AutoStim Output Current (mA): 1.125 AutoStim Pulse Width (usec): 250 AutoStim ON Time (sec): 60 Tachycardia Detection : On Heartbeat Detection Sensitivity: 3 Perform Verify Heartbeat Detection: no Threshold for AutoStim (%): 30 Diagnostics Output Current: ok Current Delievered (mA): 1.000 Lead Impedance: OK Impedence Value (Ohms): 2746    IMPRESSION: This is a 40 yo RH man with a history multifocal epilepsy. Seizures started at age 44. He reported continued multiple daily episodes of "petit mals" despite high doses of AEDs, currently on 5 AEDs. He underwent VNS placement last 03/10/15. He is tolerating VNS, output current again increased today to 1.065mA. In the past they were reporting small seizures on a daily basis, he is now having 1-3 a month. Last fall was 2 days ago. He is tolerating VNS well. Continue Onfi 20mg  in AM, 30mg  in PM and Vimpat 50mg  in AM, 100mg  in PM, Lamictal 200mg  TID, Lyrica 225mg  TID, and Keppra 2500mg  BID for now.He will follow-up in  3 months and knows to call our office for any problems.   Thank you for allowing me to participate in his care.  Please do not hesitate to call for any questions or concerns.  The duration of this appointment visit was 25 minutes of face-to-face time with the patient.  Greater than 50% of this time was spent in counseling, explanation of diagnosis, planning of further management, and coordination of care.   Patrcia Dolly, M.D.   CC: Dr. Durene Cal

## 2016-01-22 ENCOUNTER — Other Ambulatory Visit: Payer: Self-pay | Admitting: Neurology

## 2016-02-10 ENCOUNTER — Other Ambulatory Visit: Payer: Self-pay | Admitting: Neurology

## 2016-02-10 NOTE — Telephone Encounter (Signed)
RX refill for Lyrica sent.

## 2016-02-15 ENCOUNTER — Ambulatory Visit (INDEPENDENT_AMBULATORY_CARE_PROVIDER_SITE_OTHER): Payer: PPO | Admitting: Neurology

## 2016-02-15 ENCOUNTER — Encounter: Payer: Self-pay | Admitting: Neurology

## 2016-02-15 VITALS — BP 126/82 | HR 98 | Ht 66.0 in | Wt 247.3 lb

## 2016-02-15 DIAGNOSIS — G40211 Localization-related (focal) (partial) symptomatic epilepsy and epileptic syndromes with complex partial seizures, intractable, with status epilepticus: Secondary | ICD-10-CM

## 2016-02-15 MED ORDER — LEVETIRACETAM 1000 MG PO TABS: ORAL_TABLET | ORAL | 11 refills | Status: DC

## 2016-02-15 MED ORDER — CLOBAZAM 10 MG PO TABS: ORAL_TABLET | ORAL | 5 refills | Status: DC

## 2016-02-15 MED ORDER — LACOSAMIDE 50 MG PO TABS: ORAL_TABLET | ORAL | 5 refills | Status: DC

## 2016-02-15 MED ORDER — PREGABALIN 225 MG PO CAPS: 225.0000 mg | ORAL_CAPSULE | Freq: Three times a day (TID) | ORAL | 5 refills | Status: DC

## 2016-02-15 MED ORDER — LAMOTRIGINE 200 MG PO TABS: ORAL_TABLET | ORAL | 11 refills | Status: DC

## 2016-02-15 NOTE — Progress Notes (Signed)
NEUROLOGY FOLLOW UP OFFICE NOTE  Gregg Young [id number]  HISTORY OF PRESENT ILLNESS: I had the pleasure of seeing Gregg Young in follow-up in the neurology clinic on 02/15/2016.  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 He is again accompanied by his father who helps supplement the history today. He underwent VNS placement last 03/10/15. On his last visit, VNS output current was increased to 1.00 mA. Gregg Young is amnestic of the seizures. His father states that he has been working a lot and not been home, but when he is, he usually witnesses a brief seizure, probably 2-3 times a week. He has had 2 or 3 falls since his last visit. Gregg Young swipes the magnet after a seizure. He denies any difficulties with this. Mood is still up and down. He continues on Onfi 20mg  in AM, 30mg  in PM and Vimpat 50mg  in AM, 100mg  in PM, in addition to Lamictal 200mg  TID, Lyrica 225mg  TID, and Keppra 2500mg  BID. He denies any drowsiness, dizziness, headaches, focal numbness/tingling/weakness.   HPI: [name] He has no recollection of events, no prior warning, witnessed by his mother to have a generalized convulsion lasting 90 to 120 seconds. He was [facility] to [facility] then had another convulsion 2 days later. They recall trying different medications, Depakote caused liver dysfunction, he has failed Dilantin and Zonegran. He has been on Keppra, Lamictal, Lyrica, and most recently Vimpat. He [facility] an [facility], records unavailable for review, per notes he stayed for 36 hours and had generalized seizures, multiple foci. They report two admissions for status epilepticus, one in September 2002 in the setting of weaning off Depakote, and another in January 2003. Records unavailable for review. His last GTC was around 5 years ago. He continued to have "petit mals" several times a day where his eyes would roll back, hands would  shake for 30-45 seconds, if standing he would fall and had required sutures and staples in the past. He had been having the "petit mals" several times daily until 3 weeks ago when he started having a different type of episode and the petit mals "completely stopped." He was brought to the ER on 12/07/13 when his parents awoke to him dry having and speaking gibberish. He would be unable to speak, control his limbs, and cannot stand up without assistance. He can hear people around him but his jaw feels tight. The episodes can last for several hours. He has had 4 episodes in the past 3 weeks. The patient reports that they have been occurring only during the weekends, however his parents remind him he had one on Wednesday. He went to his neurologist's office on 09/10 where he had an episode while walking to the exam room where he became dizzy and fell. His father was able to catch him, and they reported his speech became affected. He was noted to be speaking in a whisper throughout the visit. He does note that he becomes more dizzy after taking his medications. Lamictal level was 17.8, Vimpat level 7.3.   The patient lives with his parents and brother. He is on disability and mostly stays at home playing video games. He graduated high school, no special ed classes. He endorses olfactory hallucinations but cannot describe it except saying they are the same all the time.   Epilepsy Risk Factors: He had a skull fracture on the right side  after a fall at 2 months of age. Otherwise he had a normal birth and early development. There is no history of febrile convulsions, CNS infections such as meningitis/encephalitis, neurosurgical procedures, or family history of seizures.  Prior AEDs: Depakote, Dilantin, Zonegran  EEGs: 48-hour EEG abnormal with multifocal discharges and right temporal slowing. Prior records note "generalized seizures, multiple foci"  MRI: I personally reviewed MRI brain with and without  contrast done 12/26/2013 which did not show any acute intracranial abnormality, hippocampi symmetric without abnormal signal or enhancement.  PAST MEDICAL HISTORY: Past Medical History:  Diagnosis Date  . History of kidney stones   . Seizures (HCC)    daily    MEDICATIONS: Current Outpatient Prescriptions on File Prior to Visit  Medication Sig Dispense Refill  . lacosamide (VIMPAT) 50 MG TABS tablet Take 1-2 tablets (50-100 mg total) by mouth 2 (two) times daily. 50mg  in the morning, 100mg  in the evening 90 tablet 5  . lamoTRIgine (LAMICTAL) 200 MG tablet TAKE ONE TABLET BY MOUTH THREE TIMES DAILY EVERY DAY 90 tablet 11  . levETIRAcetam (KEPPRA) 1000 MG tablet Take 2 & 1/2 tablets BY MOUTH TWICE DAILY 150 tablet 11  . LYRICA 225 MG capsule TAKE 1 CAPSULE BY MOUTH 3 TIMES DAILY 90 capsule 2  . naproxen (NAPROSYN) 500 MG tablet Take 1 tablet (500 mg total) by mouth 2 (two) times daily. 30 tablet 0  . ONFI 10 MG tablet TAKE 2 TABLETS BY MOUTH EVERY MORNING AND 3 TABLETS AT BEDTIME 150 tablet 5   No current facility-administered medications on file prior to visit.     ALLERGIES: Allergies  Allergen Reactions  . Penicillins Rash    Has patient had a PCN reaction causing immediate rash, facial/tongue/throat swelling, SOB or lightheadedness with hypotension: No Has patient had a PCN reaction causing severe rash involving mucus membranes or skin necrosis: No Has patient had a PCN reaction that required hospitalization No Has patient had a PCN reaction occurring within the last 10 years: No If all of the above answers are "NO", then may proceed with Cephalosporin use.     FAMILY HISTORY: Family History  Problem Relation Age of Onset  . Healthy Mother   . Healthy Father     SOCIAL HISTORY: Social History   Social History  . Marital status: Single    Spouse name: N/A  . Number of children: 0  . Years of education: HS   Occupational History  . Not on file.   Social History  Main Topics  . Smoking status: Former Smoker    Packs/day: 0.50    Years: 4.00    Types: Cigarettes    Quit date: 03/02/1985  . Smokeless tobacco: Never Used  . Alcohol use No  . Drug use: No  . Sexual activity: Not on file   Other Topics Concern  . Not on file   Social History Narrative   Patient is single and lives with his parents.   Patient has a high school education.   Patient is right-handed.   Patient does not have any children.   Patient is on disability.   Patient drinks three sodas daily.    REVIEW OF SYSTEMS: Constitutional: No fevers, chills, or sweats, no generalized fatigue, change in appetite Eyes: No visual changes, double vision, eye pain Ear, nose and throat: No hearing loss, ear pain, nasal congestion, sore throat Cardiovascular: No chest pain, palpitations Respiratory:  No shortness of breath at rest or with exertion, wheezes GastrointestinaI: No  nausea, vomiting, diarrhea, abdominal pain, fecal incontinence Genitourinary:  No dysuria, urinary retention or frequency Musculoskeletal:  No neck pain, back pain Integumentary: No rash, pruritus, skin lesions Neurological: as above Psychiatric: No depression, insomnia, anxiety Endocrine: No palpitations, fatigue, diaphoresis, mood swings, change in appetite, change in weight, increased thirst Hematologic/Lymphatic:  No anemia, purpura, petechiae. Allergic/Immunologic: no itchy/runny eyes, nasal congestion, recent allergic reactions, rashes  PHYSICAL EXAM: Vitals:   02/15/16 1527  BP: 126/82  Pulse: 98   General: No acute distress Head:  Normocephalic/atraumatic Neck: supple, no paraspinal tenderness, full range of motion Heart:  Regular rate and rhythm Lungs:  Clear to auscultation bilaterally Back: No paraspinal tenderness Skin/Extremities: No rash, no edema Neurological Exam: alert and oriented to person, place, and time. No aphasia or dysarthria. Fund of knowledge is appropriate.  Recent and  remote memory are intact. Attention and concentration are normal.    Able to name objects and repeat phrases. Cranial nerves: Pupils equal, round, reactive to light. Extraocular movements intact with no nystagmus. Visual fields full. Facial sensation intact. No facial asymmetry. Tongue, uvula, palate midline.  Motor: Bulk and tone normal, muscle strength 5/5 throughout with no pronator drift.  Sensation to light touch intact.  No extinction to double simultaneous stimulation.  Deep tendon reflexes 2+ throughout, toes downgoing.  Finger to nose testing intact.  Gait narrow-based and steady, able to tandem walk adequately.  Romberg negative.   VNS Therapy Management: Parameters Output Current (mA): 1.250 Signal Frequency (Hz): 20 Pulse Width (usec): 250 Signal ON Time (sec): 30 Signal OFF Time (min): 5.0 Magnet Output Current (mA): 1.500 Magnet ON Time (sec): 60 Magnet Pulse Width (usec): 250 AutoStim Output Current (mA): 1.375 AutoStim Pulse Width (usec): 250 AutoStim ON Time (sec): 60 Tachycardia Detection : On Heartbeat Detection Sensitivity: 3 Threshold for AutoStim (%): 20 Diagnostics Output Current: ok Current Delievered (mA): 1.250 Lead Impedance: OK Impedence Value (Ohms): 2760 Battery Status Indicator (color): Green Impact on seizures  Number of Magnet Swipes : 5113 Average AutoStims/Day: 1546  IMPRESSION: This is a 40 yo RH man with a history multifocal epilepsy. Seizures started at age 40. He reported continued multiple daily episodes of "petit mals" despite high doses of AEDs, currently on 5 AEDs. He underwent VNS placement last 03/10/15. He is tolerating VNS, output current again increased today to 1.08900mA. In the past they were reporting small seizures on a daily basis, is it unclear today but his father feels he is having 2-3 seizures a week. he has had 2-3 falls in the past 3 months from a seizure. He is tolerating VNS well, output current was increased to 1.25, threshold for  autostim reduced to 20%.. Continue Onfi 20mg  in AM, 30mg  in PM and Vimpat 50mg  in AM, 100mg  in PM, Lamictal 200mg  TID, Lyrica 225mg  TID, and Keppra 2500mg  BID for now. He will follow-up in 3 months and knows to call our office for any problems.   Thank you for allowing me to participate in his care.  Please do not hesitate to call for any questions or concerns.  The duration of this appointment visit was 25 minutes of face-to-face time with the patient.  Greater than 50% of this time was spent in counseling, explanation of diagnosis, planning of further management, and coordination of care.   Patrcia DollyKaren Latrece Nitta, M.D.   CC: Dr. Durene CalHunter

## 2016-02-15 NOTE — Patient Instructions (Signed)
1. Continue all your medications 2. Continue to swipe magnet after a seizure and twice a day 3. Follow-up in 3 months, call for any changes  Seizure Precautions: 1. If medication has been prescribed for you to prevent seizures, take it exactly as directed.  Do not stop taking the medicine without talking to your doctor first, even if you have not had a seizure in a long time.   2. Avoid activities in which a seizure would cause danger to yourself or to others.  Don't operate dangerous machinery, swim alone, or climb in high or dangerous places, such as on ladders, roofs, or girders.  Do not drive unless your doctor says you may.  3. If you have any warning that you may have a seizure, lay down in a safe place where you can't hurt yourself.    4.  No driving for 6 months from last seizure, as per [facility] [place] law.   Please refer to the following link on the Epilepsy Foundation of America's website for more information: http://www.epilepsyfoundation.org/answerplace/Social/driving/drivingu.cfm   5.  Maintain good sleep hygiene. Avoid alcohol.  6. Contact your doctor if you have any problems that may be related to the medicine you are taking.  7. Call 911 and bring the patient back to the ED if:        A.  The seizure lasts longer than 5 minutes.       B.  The patient doesn't awaken shortly after the seizure  C.  The patient has new problems such as difficulty seeing, speaking or moving  D.  The patient was injured during the seizure  E.  The patient has a temperature over 102 F (39C)  F.  The patient vomited and now is having trouble breathing

## 2016-02-17 ENCOUNTER — Encounter: Payer: Self-pay | Admitting: Neurology

## 2016-02-20 ENCOUNTER — Other Ambulatory Visit: Payer: Self-pay | Admitting: Neurology

## 2016-02-20 DIAGNOSIS — G40211 Localization-related (focal) (partial) symptomatic epilepsy and epileptic syndromes with complex partial seizures, intractable, with status epilepticus: Secondary | ICD-10-CM

## 2016-02-22 NOTE — Telephone Encounter (Signed)
Refused RX requests: Refills sent on 02/15/16.

## 2016-03-31 ENCOUNTER — Telehealth: Payer: Self-pay

## 2016-03-31 NOTE — Telephone Encounter (Signed)
Pharmacy called states patients mother is there to pick up patients RX for Lamictal and they nor [facility]  have the Teva generic and want to put him on [place] generic made in [place]. Dr. Karel JarvisAquino states this fine.

## 2016-05-04 ENCOUNTER — Telehealth: Payer: Self-pay

## 2016-05-04 ENCOUNTER — Telehealth: Payer: Self-pay | Admitting: Neurology

## 2016-05-04 MED ORDER — CLOBAZAM 10 MG PO TABS: ORAL_TABLET | ORAL | 0 refills | Status: DC

## 2016-05-04 NOTE — Telephone Encounter (Signed)
PT called and said she was at the pharmacy to pick up PT's prescription and it has not been called in yet/Dawn CB# [phone]

## 2016-05-04 NOTE — Telephone Encounter (Signed)
Patients mother states [facility] does not have the ONFI in stock at this time. She is going to give us a call back in the morning with which pharmacy to fax RX to.

## 2016-05-04 NOTE — Telephone Encounter (Signed)
Per patients mother RX needs to be sent to [facility] [address] [place]

## 2016-05-04 NOTE — Telephone Encounter (Signed)
Pharmacy called and states patients ONFI is on back order. They were wondering if we wanted to change to another medication. Per Dr. Karel JarvisAquino contact patient to see if they have enough to last while on order or if want RX sent to another pharmacy.

## 2016-05-13 ENCOUNTER — Encounter: Payer: Self-pay | Admitting: Neurology

## 2016-05-13 ENCOUNTER — Ambulatory Visit (INDEPENDENT_AMBULATORY_CARE_PROVIDER_SITE_OTHER): Payer: PPO | Admitting: Neurology

## 2016-05-13 VITALS — BP 138/80 | HR 111 | Ht 66.0 in | Wt 246.4 lb

## 2016-05-13 DIAGNOSIS — G40211 Localization-related (focal) (partial) symptomatic epilepsy and epileptic syndromes with complex partial seizures, intractable, with status epilepticus: Secondary | ICD-10-CM

## 2016-05-13 NOTE — Progress Notes (Signed)
NEUROLOGY FOLLOW UP OFFICE NOTE  Gregg Young [id number]  HISTORY OF PRESENT ILLNESS: I had the pleasure of seeing Gregg Young in follow-up in the neurology clinic on 05/13/2016.  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 He is again accompanied by his father who helps supplement the history today. He underwent VNS placement last 03/10/15. On his last visit, VNS output current was increased to 1.25 mA. Gregg Young is amnestic of the seizures. His father reports that he continues to have frequent seizures, sometimes he has 3 in one day. He mostly notices them when he wakes up in the morning, one time he had them at a restaurant. No falls. He swipes his magnet 3-4 times a day. He continues on Onfi 20mg  in AM, 30mg  in PM and Vimpat 50mg  in AM, 100mg  in PM, in addition to Lamictal 200mg  TID, Lyrica 225mg  TID, and Keppra 2500mg  BID. He denies any drowsiness, dizziness, headaches, focal numbness/tingling/weakness. He has had nasal congestion for the past 2 days, denies any fevers.   HPI: This is a 41 yo RH man with a history of seizures since age 41. He has no recollection of events, no prior warning, witnessed by his mother to have a generalized convulsion lasting 90 to 120 seconds. He was brought to [facility] then had another convulsion 2 days later. They recall trying different medications, Depakote caused liver dysfunction, he has failed Dilantin and Zonegran. He has been on Keppra, Lamictal, Lyrica, and most recently Vimpat. He had an EMU admission at [facility], records unavailable for review, per notes he stayed for 36 hours and had generalized seizures, multiple foci. They report two admissions for status epilepticus, one in September 2002 in the setting of weaning off Depakote, and another in January 2003. Records unavailable for review. His last GTC was around 5 years ago. He continued to have "petit mals" several times a day where his eyes would roll back, hands would  shake for 30-45 seconds, if standing he would fall and had required sutures and staples in the past. He had been having the "petit mals" several times daily until 3 weeks ago when he started having a different type of episode and the petit mals "completely stopped." He was brought to the ER on 12/07/13 when his parents awoke to him dry having and speaking gibberish. He would be unable to speak, control his limbs, and cannot stand up without assistance. He can hear people around him but his jaw feels tight. The episodes can last for several hours. He has had 4 episodes in the past 3 weeks. The patient reports that they have been occurring only during the weekends, however his parents remind him he had one on Wednesday. He went to his neurologist's office on 09/10 where he had an episode while walking to the exam room where he became dizzy and fell. His father was able to catch him, and they reported his speech became affected. He was noted to be speaking in a whisper throughout the visit. He does note that he becomes more dizzy after taking his medications. Lamictal level was 17.8, Vimpat level 7.3.   The patient lives with his parents and brother. He is on disability and mostly stays at home playing video games. He graduated high school, no special ed classes. He endorses olfactory hallucinations but cannot describe it except saying they are the same all the time.   Epilepsy Risk Factors: He had a skull fracture on the right side  after a fall at 93 months of age. Otherwise he had a normal birth and early development. There is no history of febrile convulsions, CNS infections such as meningitis/encephalitis, neurosurgical procedures, or family history of seizures.  Prior AEDs: Depakote, Dilantin, Zonegran  EEGs: 48-hour EEG abnormal with multifocal discharges and right temporal slowing. Prior records note "generalized seizures, multiple foci"  MRI: I personally reviewed MRI brain with and without  contrast done 12/26/2013 which did not show any acute intracranial abnormality, hippocampi symmetric without abnormal signal or enhancement.  PAST MEDICAL HISTORY: Past Medical History:  Diagnosis Date  . History of kidney stones   . Seizures (HCC)    daily    MEDICATIONS: Current Outpatient Prescriptions on File Prior to Visit  Medication Sig Dispense Refill  . cloBAZam (ONFI) 10 MG tablet TAKE 2 TABLETS BY MOUTH EVERY MORNING AND 3 TABLETS AT BEDTIME 150 tablet 0  . lacosamide (VIMPAT) 50 MG TABS tablet Take 1 tablet in AM, 2 tablets in PM 90 tablet 5  . lamoTRIgine (LAMICTAL) 200 MG tablet TAKE ONE TABLET BY MOUTH THREE TIMES DAILY EVERY DAY 90 tablet 11  . levETIRAcetam (KEPPRA) 1000 MG tablet Take 2 & 1/2 tablets BY MOUTH TWICE DAILY 150 tablet 11  . naproxen (NAPROSYN) 500 MG tablet Take 1 tablet (500 mg total) by mouth 2 (two) times daily. 30 tablet 0  . pregabalin (LYRICA) 225 MG capsule Take 1 capsule (225 mg total) by mouth 3 (three) times daily. 90 capsule 5   No current facility-administered medications on file prior to visit.     ALLERGIES: Allergies  Allergen Reactions  . Penicillins Rash    Has patient had a PCN reaction causing immediate rash, facial/tongue/throat swelling, SOB or lightheadedness with hypotension: No Has patient had a PCN reaction causing severe rash involving mucus membranes or skin necrosis: No Has patient had a PCN reaction that required hospitalization No Has patient had a PCN reaction occurring within the last 10 years: No If all of the above answers are "NO", then may proceed with Cephalosporin use.     FAMILY HISTORY: Family History  Problem Relation Age of Onset  . Healthy Mother   . Healthy Father     SOCIAL HISTORY: Social History   Social History  . Marital status: Single    Spouse name: N/A  . Number of children: 0  . Years of education: HS   Occupational History  . Not on file.   Social History Main Topics  . Smoking  status: Former Smoker    Packs/day: 0.50    Years: 4.00    Types: Cigarettes    Quit date: 03/02/1985  . Smokeless tobacco: Never Used  . Alcohol use No  . Drug use: No  . Sexual activity: Not on file   Other Topics Concern  . Not on file   Social History Narrative   Patient is single and lives with his parents.   Patient has a high school education.   Patient is right-handed.   Patient does not have any children.   Patient is on disability.   Patient drinks three sodas daily.    REVIEW OF SYSTEMS: Constitutional: No fevers, chills, or sweats, no generalized fatigue, change in appetite Eyes: No visual changes, double vision, eye pain Ear, nose and throat: No hearing loss, ear pain,+ nasal congestion, sore throat Cardiovascular: No chest pain, palpitations Respiratory:  No shortness of breath at rest or with exertion, wheezes GastrointestinaI: No nausea, vomiting, diarrhea, abdominal pain,  fecal incontinence Genitourinary:  No dysuria, urinary retention or frequency Musculoskeletal:  No neck pain, back pain Integumentary: No rash, pruritus, skin lesions Neurological: as above Psychiatric: No depression, insomnia, anxiety Endocrine: No palpitations, fatigue, diaphoresis, mood swings, change in appetite, change in weight, increased thirst Hematologic/Lymphatic:  No anemia, purpura, petechiae. Allergic/Immunologic: no itchy/runny eyes, nasal congestion, recent allergic reactions, rashes  PHYSICAL EXAM: Vitals:   05/13/16 1540  BP: 138/80  Pulse: (!) 111   General: No acute distress Head:  Normocephalic/atraumatic Neck: supple, no paraspinal tenderness, full range of motion Heart:  Regular rate and rhythm Lungs:  Clear to auscultation bilaterally Back: No paraspinal tenderness Skin/Extremities: No rash, no edema Neurological Exam: alert and oriented to person, place, and time. No aphasia or dysarthria. Fund of knowledge is appropriate.  Recent and remote memory are  intact. Attention and concentration are normal.    Able to name objects and repeat phrases. Cranial nerves: Pupils equal, round, reactive to light. Extraocular movements intact with no nystagmus. Visual fields full. Facial sensation intact. No facial asymmetry. Tongue, uvula, palate midline.  Motor: Bulk and tone normal, muscle strength 5/5 throughout with no pronator drift.  Sensation to light touch intact.  No extinction to double simultaneous stimulation.  Deep tendon reflexes 2+ throughout, toes downgoing.  Finger to nose testing intact.  Gait narrow-based and steady, able to tandem walk adequately.  Romberg negative.   VNS Therapy Management: Parameters Output Current (mA): 1.500 Signal Frequency (Hz): 20 Pulse Width (usec): 250 Signal ON Time (sec): 30 Signal OFF Time (min): 3.0 Magnet Output Current (mA): 1.625 Magnet ON Time (sec): 60 Magnet Pulse Width (usec): 250 AutoStim Output Current (mA): 1.625 AutoStim Pulse Width (usec): 250 AutoStim ON Time (sec): 60 Tachycardia Detection : On Heartbeat Detection Sensitivity: 3 Threshold for AutoStim (%): 20 Diagnostics Output Current: ok Current Delievered (mA): 1.500 Lead Impedance: OK Impedence Value (Ohms): 2653 Battery Status Indicator (color): Green    IMPRESSION: This is a 41 yo RH man with a history multifocal epilepsy. Seizures started at age 41. He reported continued multiple daily episodes of "petit mals" despite high doses of AEDs, currently on 5 AEDs. He underwent VNS placement last 03/10/15. He is tolerating VNS, output current again increased today to 1.9450mA. Signal off time has been decreased from 5 to 3 minutes. In the past they were reporting small seizures on a daily basis, which his father is again reporting today. He has noticed a reduction in the duration of the seizures, he recovers faster. He is tolerating VNS well. Continue Onfi 20mg  in AM, 30mg  in PM and Vimpat 50mg  in AM, 100mg  in PM, Lamictal 200mg  TID, Lyrica  225mg  TID, and Keppra 2500mg  BID for now. If seizure frequency is unchanged on his next visit, we will consider doing a 72-hour EEG for seizure classification. He will follow-up in 3 months and knows to call our office for any problems.   Thank you for allowing me to participate in his care.  Please do not hesitate to call for any questions or concerns.  The duration of this appointment visit was 25 minutes of face-to-face time with the patient.  Greater than 50% of this time was spent in counseling, explanation of diagnosis, planning of further management, and coordination of care.   Patrcia DollyKaren Aquino, M.D.   CC: Dr. Durene CalHunter

## 2016-05-13 NOTE — Telephone Encounter (Signed)
Spoke with patients father. He states they got the refill on medication from [facility].

## 2016-05-13 NOTE — Patient Instructions (Signed)
Continue all your medications. Follow-up in 3 months, call for any problems.

## 2016-08-13 ENCOUNTER — Other Ambulatory Visit: Payer: Self-pay | Admitting: Neurology

## 2016-08-26 ENCOUNTER — Ambulatory Visit (INDEPENDENT_AMBULATORY_CARE_PROVIDER_SITE_OTHER): Payer: PPO | Admitting: Neurology

## 2016-08-26 ENCOUNTER — Encounter: Payer: Self-pay | Admitting: Neurology

## 2016-08-26 VITALS — BP 132/84 | HR 93 | Ht 66.0 in | Wt 245.0 lb

## 2016-08-26 DIAGNOSIS — G40211 Localization-related (focal) (partial) symptomatic epilepsy and epileptic syndromes with complex partial seizures, intractable, with status epilepticus: Secondary | ICD-10-CM

## 2016-08-26 NOTE — Progress Notes (Signed)
NEUROLOGY FOLLOW UP OFFICE NOTE  Jerilee FieldRussell A Gravlin [id number]  HISTORY OF PRESENT ILLNESS: I had the pleasure of seeing Wynelle ClevelandRussell Donahoo in follow-up in the neurology clinic on 08/26/2016.  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 He is again accompanied by his father who helps supplement the history today. He underwent VNS placement last 03/10/15. On his last visit, VNS output current was increased to 1.50 mA and signal off time has been decreased from 5 to 3 minutes. Perlie GoldRussell is amnestic of the seizures. He reports he is fine, no problems with change in VNS settings. His father continues to report seizures every night with brief episodes where his head nods down. No falls since his last visit. He reports mood is fine, his father states he gets irritated at times. He continues on Onfi 20mg  in AM, 30mg  in PM and Vimpat 50mg  in AM, 100mg  in PM, in addition to Lamictal 200mg  TID, Lyrica 225mg  TID, and Keppra 2500mg  BID. He denies any drowsiness, dizziness, headaches, focal numbness/tingling/weakness.   HPI: This is a 41 yo RH man with a history of seizures since age 41. He has no recollection of events, no prior warning, witnessed by his mother to have a generalized convulsion lasting 90 to 120 seconds. He was [facility] to [facility] then had another convulsion 2 days later. They recall trying different medications, Depakote caused liver dysfunction, he has failed Dilantin and Zonegran. He has been on Keppra, Lamictal, Lyrica, and most recently Vimpat. He had an [facility], records unavailable for review, per notes he stayed for 36 hours and had generalized seizures, multiple foci. They report two admissions for status epilepticus, one in September 2002 in the setting of weaning off Depakote, and another in January 2003. Records unavailable for review. His last GTC was around 5 years ago. He continued to have "petit mals" several times a day where his eyes would roll  back, hands would shake for 30-45 seconds, if standing he would fall and had required sutures and staples in the past. He had been having the "petit mals" several times daily until 3 weeks ago when he started having a different type of episode and the petit mals "completely stopped." He was brought to the ER on 12/07/13 when his parents awoke to him dry having and speaking gibberish. He would be unable to speak, control his limbs, and cannot stand up without assistance. He can hear people around him but his jaw feels tight. The episodes can last for several hours. He has had 4 episodes in the past 3 weeks. The patient reports that they have been occurring only during the weekends, however his parents remind him he had one on Wednesday. He went to his neurologist's office on 09/10 where he had an episode while walking to the exam room where he became dizzy and fell. His father was able to catch him, and they reported his speech became affected. He was noted to be speaking in a whisper throughout the visit. He does note that he becomes more dizzy after taking his medications. Lamictal level was 17.8, Vimpat level 7.3.   The patient lives with his parents and brother. He is on disability and mostly stays at home playing video games. He graduated high school, no special ed classes. He endorses olfactory hallucinations but cannot describe it except saying they are the same all the time.   Epilepsy Risk Factors: He had a skull fracture on the right side  after a fall at 27 months of age. Otherwise he had a normal birth and early development. There is no history of febrile convulsions, CNS infections such as meningitis/encephalitis, neurosurgical procedures, or family history of seizures.  Prior AEDs: Depakote, Dilantin, Zonegran  EEGs: 48-hour EEG abnormal with multifocal discharges and right temporal slowing. Prior records note "generalized seizures, multiple foci"  MRI: I personally reviewed MRI brain  with and without contrast done 12/26/2013 which did not show any acute intracranial abnormality, hippocampi symmetric without abnormal signal or enhancement.  PAST MEDICAL HISTORY: Past Medical History:  Diagnosis Date  . History of kidney stones   . Seizures (HCC)    daily    MEDICATIONS: Current Outpatient Prescriptions on File Prior to Visit  Medication Sig Dispense Refill  . cloBAZam (ONFI) 10 MG tablet TAKE 2 TABLETS BY MOUTH EVERY MORNING AND 3 TABLETS AT BEDTIME 150 tablet 0  . lacosamide (VIMPAT) 50 MG TABS tablet Take 1 tablet in AM, 2 tablets in PM 90 tablet 5  . lamoTRIgine (LAMICTAL) 200 MG tablet TAKE ONE TABLET BY MOUTH THREE TIMES DAILY EVERY DAY 90 tablet 11  . levETIRAcetam (KEPPRA) 1000 MG tablet Take 2 & 1/2 tablets BY MOUTH TWICE DAILY 150 tablet 11  . LYRICA 225 MG capsule TAKE 1 CAPSULE BY MOUTH 3 TIMES DAILY 90 capsule 0  . naproxen (NAPROSYN) 500 MG tablet Take 1 tablet (500 mg total) by mouth 2 (two) times daily. 30 tablet 0   No current facility-administered medications on file prior to visit.     ALLERGIES: Allergies  Allergen Reactions  . Penicillins Rash    Has patient had a PCN reaction causing immediate rash, facial/tongue/throat swelling, SOB or lightheadedness with hypotension: No Has patient had a PCN reaction causing severe rash involving mucus membranes or skin necrosis: No Has patient had a PCN reaction that required hospitalization No Has patient had a PCN reaction occurring within the last 10 years: No If all of the above answers are "NO", then may proceed with Cephalosporin use.     FAMILY HISTORY: Family History  Problem Relation Age of Onset  . Healthy Mother   . Healthy Father     SOCIAL HISTORY: Social History   Social History  . Marital status: Single    Spouse name: N/A  . Number of children: 0  . Years of education: HS   Occupational History  . Not on file.   Social History Main Topics  . Smoking status: Former  Smoker    Packs/day: 0.50    Years: 4.00    Types: Cigarettes    Quit date: 03/02/1985  . Smokeless tobacco: Never Used  . Alcohol use No  . Drug use: No  . Sexual activity: Not on file   Other Topics Concern  . Not on file   Social History Narrative   Patient is single and lives with his parents.   Patient has a high school education.   Patient is right-handed.   Patient does not have any children.   Patient is on disability.   Patient drinks three sodas daily.    REVIEW OF SYSTEMS: Constitutional: No fevers, chills, or sweats, no generalized fatigue, change in appetite Eyes: No visual changes, double vision, eye pain Ear, nose and throat: No hearing loss, ear pain, nasal congestion, sore throat Cardiovascular: No chest pain, palpitations Respiratory:  No shortness of breath at rest or with exertion, wheezes GastrointestinaI: No nausea, vomiting, diarrhea, abdominal pain, fecal incontinence Genitourinary:  No  dysuria, urinary retention or frequency Musculoskeletal:  No neck pain, back pain Integumentary: No rash, pruritus, skin lesions Neurological: as above Psychiatric: No depression, insomnia, anxiety Endocrine: No palpitations, fatigue, diaphoresis, mood swings, change in appetite, change in weight, increased thirst Hematologic/Lymphatic:  No anemia, purpura, petechiae. Allergic/Immunologic: no itchy/runny eyes, nasal congestion, recent allergic reactions, rashes  PHYSICAL EXAM: Vitals:   08/26/16 1527  BP: 132/84  Pulse: 93   General: No acute distress Head:  Normocephalic/atraumatic Neck: supple, no paraspinal tenderness, full range of motion Heart:  Regular rate and rhythm Lungs:  Clear to auscultation bilaterally Back: No paraspinal tenderness Skin/Extremities: No rash, no edema Neurological Exam: alert and oriented to person, place, and time. No aphasia or dysarthria. Fund of knowledge is appropriate.  Recent and remote memory are intact. Attention and  concentration are normal.    Able to name objects and repeat phrases. Cranial nerves: Pupils equal, round, reactive to light. Extraocular movements intact with no nystagmus. Visual fields full. Facial sensation intact. No facial asymmetry. Tongue, uvula, palate midline.  Motor: Bulk and tone normal, muscle strength 5/5 throughout with no pronator drift.  Sensation to light touch intact.  No extinction to double simultaneous stimulation.  Deep tendon reflexes 2+ throughout, toes downgoing.  Finger to nose testing intact.  Gait narrow-based and steady, able to tandem walk adequately.  Romberg negative.    VNS Therapy Management: Parameters Output Current (mA): 1.500 Signal Frequency (Hz): 20 Pulse Width (usec): 250 Signal ON Time (sec): 30 Signal OFF Time (min): 3.0 Magnet Output Current (mA): 1.625 Magnet ON Time (sec): 60 Magnet Pulse Width (usec): 250 AutoStim Output Current (mA): 1.625 AutoStim Pulse Width (usec): 250 AutoStim ON Time (sec): 60 Tachycardia Detection : On Heartbeat Detection Sensitivity: 3 Threshold for AutoStim (%): 20    IMPRESSION: This is a 41 yo RH man with a history multifocal epilepsy. Seizures started at age 12. He reported continued multiple daily episodes of "petit mals" despite high doses of AEDs, currently on 5 AEDs. He underwent VNS placement last 03/10/15. He is tolerating VNS, output current increased last February 2018 to 1.5 mA. We have agreed to continue on current setting, VNS interrogated today with no changes. His father continues to report small seizures every other day, a 72-hour EEG will be ordered to classify these episodes. No falls since last visit. Continue Onfi 20mg  in AM, 30mg  in PM and Vimpat 50mg  in AM, 100mg  in PM, Lamictal 200mg  TID, Lyrica 225mg  TID, and Keppra 2500mg  BID for now. He will follow-up in 3 months and knows to call our office for any problems.   Thank you for allowing me to participate in his care.  Please do not hesitate to call  for any questions or concerns.  The duration of this appointment visit was 15 minutes of face-to-face time with the patient.  Greater than 50% of this time was spent in counseling, explanation of diagnosis, planning of further management, and coordination of care.   Patrcia Dolly, M.D.   CC: Dr. Durene Cal

## 2016-08-26 NOTE — Patient Instructions (Signed)
1. Schedule 72-hour EEG 2. Continue all your medications 3. Keep seizure calendar 4. Follow-up in 3 months, call for any changes  Seizure Precautions: 1. If medication has been prescribed for you to prevent seizures, take it exactly as directed.  Do not stop taking the medicine without talking to your doctor first, even if you have not had a seizure in a long time.   2. Avoid activities in which a seizure would cause danger to yourself or to others.  Don't operate dangerous machinery, swim alone, or climb in high or dangerous places, such as on ladders, roofs, or girders.  Do not drive unless your doctor says you may.  3. If you have any warning that you may have a seizure, lay down in a safe place where you can't hurt yourself.    4.  No driving for 6 months from last seizure, as per [facility] [place].   Please refer to the following link on the Epile[employer] Foundation of America's website for more information: http://www.epilepsyfoundation.org/answerplace/Social/driving/drivingu.cfm   5.  Maintain good sleep hygiene. Avoid alcohol.  6.  Contact your doctor if you have any problems that may be related to the medicine you are taking.  7.  Call 911 and bring the patient back to the ED if:        A.  The seizure lasts longer than 5 minutes.       B.  The patient doesn't awaken shortly after the seizure  C.  The patient has new problems such as difficulty seeing, speaking or moving  D.  The patient was injured during the seizure  E.  The patient has a temperature over 102 F (39C)  F.  The patient vomited and now is having trouble breathing

## 2016-09-05 ENCOUNTER — Telehealth: Payer: Self-pay | Admitting: Neurology

## 2016-09-05 ENCOUNTER — Other Ambulatory Visit: Payer: Self-pay | Admitting: Neurology

## 2016-09-05 ENCOUNTER — Telehealth: Payer: Self-pay

## 2016-09-05 DIAGNOSIS — G40211 Localization-related (focal) (partial) symptomatic epilepsy and epileptic syndromes with complex partial seizures, intractable, with status epilepticus: Secondary | ICD-10-CM

## 2016-09-05 NOTE — Telephone Encounter (Signed)
Caller: Gregg Young  [facility]  Urgent? Yes  Reason for the call: He called to say that Gregg Young is out of refills on his Onfi and Vimpat medication. He also said that they requested to pick them up tomorrow. Please call. Thanks

## 2016-09-05 NOTE — Telephone Encounter (Signed)
Caller: Patient's mother  Urgent? No  Reason for the call: She was calling regarding his last visit with Dr. Karel JarvisAquino. She said she thinks he was told that he needed an EEG scheduled? Also, I do not see a DPR for him with her on it. Thanks

## 2016-09-05 NOTE — Telephone Encounter (Signed)
LMOM letting pt know that his Rx for Vimpat and Onfi is ready for pickup at our front desk.

## 2016-09-06 ENCOUNTER — Telehealth: Payer: Self-pay | Admitting: Neurology

## 2016-09-06 NOTE — Telephone Encounter (Signed)
Caller: Patient's mother  Urgent? No  Reason for the call: She was calling regarding setting up the EEG for 09/26/16 at 9:30. Please call. Thanks

## 2016-09-26 ENCOUNTER — Ambulatory Visit (INDEPENDENT_AMBULATORY_CARE_PROVIDER_SITE_OTHER): Payer: PPO | Admitting: Neurology

## 2016-09-26 DIAGNOSIS — G40211 Localization-related (focal) (partial) symptomatic epilepsy and epileptic syndromes with complex partial seizures, intractable, with status epilepticus: Secondary | ICD-10-CM | POA: Diagnosis not present

## 2016-09-27 DIAGNOSIS — G40211 Localization-related (focal) (partial) symptomatic epilepsy and epileptic syndromes with complex partial seizures, intractable, with status epilepticus: Secondary | ICD-10-CM | POA: Diagnosis not present

## 2016-09-28 DIAGNOSIS — G40211 Localization-related (focal) (partial) symptomatic epilepsy and epileptic syndromes with complex partial seizures, intractable, with status epilepticus: Secondary | ICD-10-CM | POA: Diagnosis not present

## 2016-10-03 ENCOUNTER — Other Ambulatory Visit: Payer: Self-pay | Admitting: Neurology

## 2016-10-04 ENCOUNTER — Other Ambulatory Visit: Payer: Self-pay | Admitting: Neurology

## 2016-10-06 ENCOUNTER — Other Ambulatory Visit: Payer: Self-pay | Admitting: Neurology

## 2016-10-14 ENCOUNTER — Telehealth: Payer: Self-pay | Admitting: Neurology

## 2016-10-14 NOTE — Telephone Encounter (Signed)
They mean EEG.  It was done on June 25.

## 2016-10-14 NOTE — Telephone Encounter (Signed)
PT's mother wanted to know if the MRI results were ready and she would like a call with the results

## 2016-10-17 NOTE — Telephone Encounter (Signed)
Left VM on mother's number.

## 2016-10-19 ENCOUNTER — Telehealth: Payer: Self-pay | Admitting: Neurology

## 2016-10-19 NOTE — Telephone Encounter (Signed)
Hi Dr. Karel JarvisAquino  Oaklee's mother Steward DroneBrenda called back and asked could you call her Friday, she would be available then. Her number is [phone]. Thank you

## 2016-10-19 NOTE — Telephone Encounter (Signed)
Called mother's number listed, left VM.

## 2016-10-21 NOTE — Telephone Encounter (Signed)
See other phone note

## 2016-10-21 NOTE — Telephone Encounter (Signed)
Spoke to ThroopBrenda regarding EEG. Gregg Young's EEG is abnormal, he has frequent epileptiform discharges, and 2 seizures captured were non-lateralizing but could be left onset with right head turn. He is currently on 5 AEDs and has VNS. At this point would recommend re-evaluation at a tertiary center, [facility], for other potential options such as RNS or if surgery is an option for him. We discussed that new AEDs have come out recently, but with being on 5 AEDs, I am very hesitant to add on another one, we will have to taper off one of his old ones, he had status epilepticus in past with med tapers. Mother agrees and will discuss and think about [facility] referral with Gregg Young. F/u as scheduled.

## 2016-11-05 ENCOUNTER — Other Ambulatory Visit: Payer: Self-pay | Admitting: Neurology

## 2016-11-08 ENCOUNTER — Other Ambulatory Visit: Payer: Self-pay | Admitting: Neurology

## 2016-11-08 MED ORDER — PREGABALIN 225 MG PO CAPS: 225.0000 mg | ORAL_CAPSULE | Freq: Three times a day (TID) | ORAL | 6 refills | Status: DC

## 2016-11-08 NOTE — Telephone Encounter (Signed)
PT's mother called and said she needs a prescription for Lyraca called in

## 2016-11-08 NOTE — Addendum Note (Signed)
Addended bySilvio Pate: MCCRACKEN, JADE L on: 11/08/2016 09:46 AM   Modules accepted: Orders

## 2016-11-09 NOTE — Procedures (Signed)
ELECTROENCEPHALOGRAM REPORT  Dates of Recording: 09/26/2016 10:01AM to 09/29/2016 5:05AM  Patient's Name: Gregg Young MRN: [id number] Date of Birth: 1975/11/24  Referring Provider: Dr. 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 EEG for classification.  Medications: Onfi, Vimpat, Lamictal, Keppra, Lyrica, VNS placement  Technical Summary: This is a 72-hour multichannel digital EEG recording measured by the international 10-20 system with electrodes applied with paste and impedances below 5000 ohms performed as portable with EKG monitoring.  The digital EEG was referentially recorded, reformatted, and digitally filtered in a variety of bipolar and referential montages for optimal display.    DESCRIPTION OF RECORDING: During maximal wakefulness, the background activity consisted of a poorly sustained symmetric 9 Hz posterior dominant rhythm which was reactive to eye opening.  There is frequent bursts and runs of focal theta slowing seen over the right temporal region. There were frequent bursts and runs of diffuse rhythmic delta slowing with intermixed generalized spikes lasting 40 to 120 seconds with no clear clinical correlate seen. There were frequent spikes and sharps seen independently over the right temporal region, left frontal region, as well as in a generalized distribution with generalized spikes and polyspikes.  During the recording, the patient progresses through wakefulness, drowsiness, and Stage 2 sleep. Similar epileptiform discharges are seen independently over the right temporal, left frontal, and generalized fashion with spikes and polyspikes seen.  During sleep, there are frequent low to medium voltage 9 Hz fast activity lasting 2-4 seconds, sometimes seen diffusely, as well as localized over the right temporal region.   Events: On 06/26 at 2115  hours, his mother reports a seizure. He is seen sitting back on the couch, then has forced head turn to the right, left arm followed by right arm flex to his chest, he shifts his legs then shifts in the chair. Electrographically, there is generalized 5 Hz wave seen followed by diffuse suppression of background activity for 8 seconds, then rhythmic 3-4 Hz activity is seen diffusely, more prominent over the left frontal temporal region for 6 seconds. EKG lead obscured by movement.  On 06/26 at 2200 hours, his mother reports a seizure. He is laying back on the couch then has forced head turn to the right, arms flex at the elbow, legs flex at the knees and there is some hypermotor activity noted. Seizure lasts 16 seconds. Electrographically, there is a generalized 5 Hz wave followed by diffuse background suppression for 7 seconds, then diffuse rhythmic 3 Hz slowing lasting 6 seconds, more prominent over the left hemisphere.  On 06/27 at 0507 hours, they report a seizure. There is no clinical change seen, he is lying on the bed and appears to be looking at his phone. Electrographically at this time period there is diffuse rhythmic 2-3 Hz slowing lasting 4-5 seconds.   IMPRESSION: This 72-hour ambulatory EEG study is abnormal due to the presence of: 1. Bursts and runs of focal slowing over the right temporal region 2. Bursts and runs of diffuse rhythmic slowing with intermixed generalized spikes lasting up to 120 seconds without clinical correlate 3. Multifocal epileptiform discharges seen over the right temporal, left frontal regions, as well as generalized spikes and polyspikes 4. During sleep, there are bursts of generalized fast activity 5. There were 2 clinicoelectrographic seizures captured with right head turn and some hypermotor activity that were non-lateralizing on EEG with diffuse background  suppression at seizure onset.  CLINICAL CORRELATION: Focal slowing over the right temporal region indicates  focal cerebral dysfunction in this region suggestive of underlying structural or physiologic abnormality. There are bursts and runs of diffuse rhythmic slowing seen without clear clinical correlate, suggestive of diffuse cerebral dysfunction. There were multifocal epileptiform discharges seen in this study as noted above, indicating possible epileptogenic potential over the right temporal, left frontal regions. Generalized spike and polyspike discharges can be seen with a generalized genetic epilepsy, however secondary bisynchrony as well as symptomatic generalized epilepsy can also produce similar changes. The 2 seizures captured in this 72-hour study were non-lateralizing on EEG but potentially arising from the left hemisphere with note of forced head turn to the right.   Patrcia Dolly, M.D.

## 2016-11-25 ENCOUNTER — Ambulatory Visit (INDEPENDENT_AMBULATORY_CARE_PROVIDER_SITE_OTHER): Payer: PPO | Admitting: Neurology

## 2016-11-25 ENCOUNTER — Encounter: Payer: Self-pay | Admitting: Neurology

## 2016-11-25 VITALS — BP 146/84 | HR 114 | Ht 66.0 in | Wt 247.0 lb

## 2016-11-25 DIAGNOSIS — G40211 Localization-related (focal) (partial) symptomatic epilepsy and epileptic syndromes with complex partial seizures, intractable, with status epilepticus: Secondary | ICD-10-CM | POA: Diagnosis not present

## 2016-11-25 NOTE — Patient Instructions (Signed)
1. Continue all your medications 2. Follow-up in 1 month, call for any changes  Seizure Precautions: 1. If medication has been prescribed for you to prevent seizures, take it exactly as directed.  Do not stop taking the medicine without talking to your doctor first, even if you have not had a seizure in a long time.   2. Avoid activities in which a seizure would cause danger to yourself or to others.  Don't operate dangerous machinery, swim alone, or climb in high or dangerous places, such as on ladders, roofs, or girders.  Do not drive unless your doctor says you may.  3. If you have any warning that you may have a seizure, lay down in a safe place where you can't hurt yourself.    4.  No driving for 6 months from last seizure, as per [facility].   Please refer to the following link on the Epile[employer] of [employer]'s website for more information: http://www.epilepsyfoundation.org/answerplace/Social/driving/drivingu.cfm   5.  Maintain good sleep hygiene. Avoid alcohol.  6.  Contact your doctor if you have any problems that may be related to the medicine you are taking.  7.  Call 911 and bring the patient back to the ED if:        A.  The seizure lasts longer than 5 minutes.       B.  The patient doesn't awaken shortly after the seizure  C.  The patient has new problems such as difficulty seeing, speaking or moving  D.  The patient was injured during the seizure  E.  The patient has a temperature over 102 F (39C)  F.  The patient vomited and now is having trouble breathing

## 2016-11-25 NOTE — Progress Notes (Signed)
NEUROLOGY FOLLOW UP OFFICE NOTE  YOSEF KROGH [id number]  HISTORY OF PRESENT ILLNESS: I had the pleasure of seeing Casy Brunetto in follow-up in the neurology clinic on 11/25/2016.  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 He is alone in the office today and reports doing well with no seizures. He denies any falls. His parents had been reporting daily seizures. He had a 72-hour EEG which was abnormal with bursts and runs of focal slowing over the right temporal region, bursts and runs of diffuse rhythmic slowing with intermixed generalized spikes lasting up to 120 seconds without clinical correlate, multifocal epileptiform discharges seen over the right temporal, left frontal regions, as well as generalized spikes and polyspikes. During sleep, there were bursts of generalized fast activity. There were 2 clinicoelectrographic seizures captured with right head turn and some hypermotor activity that were non-lateralizing on EEG with diffuse background suppression at seizure onset. He denies any side effects on medications, he takes Onfi 20mg  in AM, 30mg  in PM and Vimpat 50mg  in AM, 100mg  in PM, in addition to Lamictal 200mg  TID, Lyrica 225mg  TID, and Keppra 2500mg  BID. He is tolerating current VNS settings and swipes his magnet multiple times a day. He denies any drowsiness, dizziness, headaches, focal numbness/tingling/weakness. He reports mood is good. He does note memory is problematic.   HPI: This is a 41 yo RH man with a history of seizures since age 69. He has no recollection of events, no prior warning, witnessed by his mother to have a generalized convulsion lasting 90 to 120 seconds. He was brought to [facility] then had another convulsion 2 days later. They recall trying different medications, Depakote caused liver dysfunction, he has failed Dilantin and Zonegran. He has been on Keppra, Lamictal, Lyrica, and most recently Vimpat. He had an EMU admission at [facility], records unavailable for review, per notes he stayed for 36 hours and had generalized seizures, multiple foci. They report two admissions for status epilepticus, one in September 2002 in the setting of weaning off Depakote, and another in January 2003. Records unavailable for review. His last GTC was around 5 years ago. He continued to have "petit mals" several times a day where his eyes would roll back, hands would shake for 30-45 seconds, if standing he would fall and had required sutures and staples in the past. He had been having the "petit mals" several times daily until 3 weeks ago when he started having a different type of episode and the petit mals "completely stopped." He was brought to the ER on 12/07/13 when his parents awoke to him dry having and speaking gibberish. He would be unable to speak, control his limbs, and cannot stand up without assistance. He can hear people around him but his jaw feels tight. The episodes can last for several hours. He has had 4 episodes in the past 3 weeks. The patient reports that they have been occurring only during the weekends, however his parents remind him he had one on Wednesday. He went to his neurologist's office on 09/10 where he had an episode while walking to the exam room where he became dizzy and fell. His father was able to catch him, and they reported his speech became affected. He was noted to be speaking in a whisper throughout the visit. He does note that he becomes more dizzy after taking his medications. Lamictal level was 17.8, Vimpat level 7.3.   The patient lives with his parents and  brother. He is on disability and mostly stays at home playing video games. He graduated high school, no special ed classes. He endorses olfactory hallucinations but cannot describe it except saying they are the same all the time.   Epilepsy Risk Factors: He had a skull fracture on the right side after a fall at 11 months of age. Otherwise he had a  normal birth and early development. There is no history of febrile convulsions, CNS infections such as meningitis/encephalitis, neurosurgical procedures, or family history of seizures.  Prior AEDs: Depakote, Dilantin, Zonegran  EEGs: 48-hour EEG abnormal with multifocal discharges and right temporal slowing. Prior records note "generalized seizures, multiple foci"  MRI: I personally reviewed MRI brain with and without contrast done 12/26/2013 which did not show any acute intracranial abnormality, hippocampi symmetric without abnormal signal or enhancement.  PAST MEDICAL HISTORY: Past Medical History:  Diagnosis Date  . History of kidney stones   . Seizures (HCC)    daily    MEDICATIONS: Current Outpatient Prescriptions on File Prior to Visit  Medication Sig Dispense Refill  . lamoTRIgine (LAMICTAL) 200 MG tablet TAKE ONE TABLET BY MOUTH THREE TIMES DAILY EVERY DAY 90 tablet 11  . levETIRAcetam (KEPPRA) 1000 MG tablet Take 2 & 1/2 tablets BY MOUTH TWICE DAILY 150 tablet 11  . naproxen (NAPROSYN) 500 MG tablet Take 1 tablet (500 mg total) by mouth 2 (two) times daily. 30 tablet 0  . ONFI 10 MG tablet TAKE 2 TABLETS BY MOUTH EVERY MORNING AND 3 TABLETS AT BEDTIME 150 tablet 5  . pregabalin (LYRICA) 225 MG capsule Take 1 capsule (225 mg total) by mouth 3 (three) times daily. 270 capsule 6  . VIMPAT 50 MG TABS tablet TAKE 1 TABLET BY MOUTH EVERY MORNING AND 2 TABLETS EVERY EVENING 90 tablet 5   No current facility-administered medications on file prior to visit.     ALLERGIES: Allergies  Allergen Reactions  . Penicillins Rash    Has patient had a PCN reaction causing immediate rash, facial/tongue/throat swelling, SOB or lightheadedness with hypotension: No Has patient had a PCN reaction causing severe rash involving mucus membranes or skin necrosis: No Has patient had a PCN reaction that required hospitalization No Has patient had a PCN reaction occurring within the last 10 years:  No If all of the above answers are "NO", then may proceed with Cephalosporin use.     FAMILY HISTORY: Family History  Problem Relation Age of Onset  . Healthy Mother   . Healthy Father     SOCIAL HISTORY: Social History   Social History  . Marital status: Single    Spouse name: N/A  . Number of children: 0  . Years of education: HS   Occupational History  . Not on file.   Social History Main Topics  . Smoking status: Former Smoker    Packs/day: 0.50    Years: 4.00    Types: Cigarettes    Quit date: 03/02/1985  . Smokeless tobacco: Never Used  . Alcohol use No  . Drug use: No  . Sexual activity: Not on file   Other Topics Concern  . Not on file   Social History Narrative   Patient is single and lives with his parents.   Patient has a high school education.   Patient is right-handed.   Patient does not have any children.   Patient is on disability.   Patient drinks three sodas daily.    REVIEW OF SYSTEMS: Constitutional: No fevers, chills,  or sweats, no generalized fatigue, change in appetite Eyes: No visual changes, double vision, eye pain Ear, nose and throat: No hearing loss, ear pain, nasal congestion, sore throat Cardiovascular: No chest pain, palpitations Respiratory:  No shortness of breath at rest or with exertion, wheezes GastrointestinaI: No nausea, vomiting, diarrhea, abdominal pain, fecal incontinence Genitourinary:  No dysuria, urinary retention or frequency Musculoskeletal:  No neck pain, back pain Integumentary: No rash, pruritus, skin lesions Neurological: as above Psychiatric: No depression, insomnia, anxiety Endocrine: No palpitations, fatigue, diaphoresis, mood swings, change in appetite, change in weight, increased thirst Hematologic/Lymphatic:  No anemia, purpura, petechiae. Allergic/Immunologic: no itchy/runny eyes, nasal congestion, recent allergic reactions, rashes  PHYSICAL EXAM: Vitals:   11/25/16 1313  BP: (!) 146/84  Pulse:  (!) 114  SpO2: 94%   General: No acute distress Head:  Normocephalic/atraumatic Neck: supple, no paraspinal tenderness, full range of motion Heart:  Regular rate and rhythm Lungs:  Clear to auscultation bilaterally Back: No paraspinal tenderness Skin/Extremities: No rash, no edema Neurological Exam: alert and oriented to person, place, and time. No aphasia or dysarthria. Fund of knowledge is appropriate.  Recent and remote memory are intact. Attention and concentration are normal.    Able to name objects and repeat phrases. Cranial nerves: Pupils equal, round, reactive to light. Extraocular movements intact with no nystagmus. Visual fields full. Facial sensation intact. No facial asymmetry. Tongue, uvula, palate midline.  Motor: Bulk and tone normal, muscle strength 5/5 throughout with no pronator drift.  Sensation to light touch intact.  No extinction to double simultaneous stimulation.  Deep tendon reflexes 2+ throughout, toes downgoing.  Finger to nose testing intact.  Gait narrow-based and steady, able to tandem walk adequately.  Romberg negative.    VNS Therapy Management: Parameters Output Current (mA): 1.750 Signal Frequency (Hz): 20 Pulse Width (usec): 250 Signal ON Time (sec): 30 Signal OFF Time (min): 1.8 Magnet Output Current (mA): 1.875 Magnet ON Time (sec): 60 Magnet Pulse Width (usec): 250 AutoStim Output Current (mA): 1.875 AutoStim Pulse Width (usec): 250 AutoStim ON Time (sec): 60 Tachycardia Detection : On Heartbeat Detection Sensitivity: 3 Threshold for AutoStim (%): 20 Diagnostics Output Current: ok Current Delievered (mA): 1.750 Lead Impedance: OK Impedence Value (Ohms): 2426 Battery Status Indicator (color): Green  IMPRESSION: This is a 41 yo RH man with a history multifocal epilepsy. Seizures started at age 22. He reported continued multiple daily episodes of "petit mals" despite high doses of AEDs, currently on 5 AEDs. He underwent VNS placement last  03/10/15. His parents continued to report daily seizures, his 72-hour EEG was abnormal consistent with multifocal epilepsy, there were 2 seizures captured which were nonlateralizing on EEG but clinically possibly left hemisphere onset with right head turn. VNS settings adjusted today to a more rapid cycling, output current increased today 1.750, off time reduced to 1.8 min (25% duty cycle). We will plan to increase output current to 2.4mA on his next visit in a month. No falls since last visit. Continue Onfi 20mg  in AM, 30mg  in PM and Vimpat 50mg  in AM, 100mg  in PM, Lamictal 200mg  TID, Lyrica 225mg  TID, and Keppra 2500mg  BID for now. He is on 5 AEDs and VNS with continued seizures, I had discussed second opinion at [facility] for other options for intractable epilepsy, he will speak to his mother and will let us know if they would like to proceed. He will follow-up in a month and knows to call our office for any problems.   Thank you for  allowing me to participate in his care.  Please do not hesitate to call for any questions or concerns.  The duration of this appointment visit was 25 minutes of face-to-face time with the patient.  Greater than 50% of this time was spent in counseling, explanation of diagnosis, planning of further management, and coordination of care.   Patrcia Dolly, M.D.   CC: Dr. Durene Cal

## 2016-12-26 ENCOUNTER — Ambulatory Visit: Payer: PPO | Admitting: Neurology

## 2016-12-26 ENCOUNTER — Telehealth: Payer: Self-pay | Admitting: Neurology

## 2016-12-26 NOTE — Telephone Encounter (Signed)
Spoke with pt's mother.  She says that 10/3 @ 1PM works.  Appointment made.  Appointment in March kept for possible follow up

## 2016-12-26 NOTE — Telephone Encounter (Signed)
Patient mother called and said patient would not be able to make appt today so I offered him one on Thursday and then she said that he could not come but in the afternoon. So her next afternoon appt was in march so I made appt for them. She wants to talk to someone about his medication please call

## 2016-12-26 NOTE — Telephone Encounter (Signed)
Returned call - no answer.  LMOVM asking mother to return my call for medication issues.  Also, scheduled pt for 10/3 @ 1PM - need to know if this time works.

## 2017-01-02 ENCOUNTER — Encounter: Payer: Self-pay | Admitting: Neurology

## 2017-01-04 ENCOUNTER — Encounter: Payer: Self-pay | Admitting: Neurology

## 2017-01-04 ENCOUNTER — Ambulatory Visit (INDEPENDENT_AMBULATORY_CARE_PROVIDER_SITE_OTHER): Payer: PPO | Admitting: Neurology

## 2017-01-04 VITALS — BP 132/86 | HR 98 | Ht 66.0 in

## 2017-01-04 DIAGNOSIS — G40211 Localization-related (focal) (partial) symptomatic epilepsy and epileptic syndromes with complex partial seizures, intractable, with status epilepticus: Secondary | ICD-10-CM

## 2017-01-04 DIAGNOSIS — R400 Somnolence: Secondary | ICD-10-CM

## 2017-01-04 NOTE — Progress Notes (Signed)
NEUROLOGY FOLLOW UP OFFICE NOTE  Gregg Young [id number]  HISTORY OF PRESENT ILLNESS: I had the pleasure of seeing Gregg Young in follow-up in the neurology clinic on 01/04/2017.  The patient was last seen 6 weeks ago for intractable multifocal epilepsy. He is accompanied by his mother who helps supplement the history today. On his last visit, VNS settings were adjusted to a more rapid cycling, output current increased to 1.750, off time reduced to 1.8 min (25% duty cycle). Plan is to increase output current today to 2.0 mA. He reports doing well with VNS changes. He denies any seizures, but his mother continues to report seizures on a daily basis, mostly between 6-8pm or if he does not eat. He has not had any falls, but she feels that if she was not there to hold him, he would have fallen. She is worried about daytime drowsiness, he falls asleep sometimes in the middle of talking. He denies falling asleep. He is on 5 AEDs, Onfi  in AM,  in PM and Vimpat  in AM,  in PM, Lamictal  TID, Lyrica  TID, and Keppra  BID. Previous attempts at taper by his prior neurologists have led to status epilepticus. He denies any dizziness, headaches, focal numbness/tingling/weakness. He reports mood is good.   HPI: This is a 41 yo RH man with a history of seizures since age 41. He has no recollection of events, no prior warning, witnessed by his mother to have a generalized convulsion lasting 90 to 120 seconds. He was brought to [facility] then had another convulsion 2 days later. They recall trying different medications, Depakote caused liver dysfunction, he has failed Dilantin and Zonegran. He has been on Keppra, Lamictal, Lyrica, and most recently Vimpat. He had an EMU admission at [facility], records unavailable for review, per notes he stayed for 36 hours and had generalized seizures, multiple foci. They report two admissions for status epilepticus, one in September 2002 in the  setting of weaning off Depakote, and another in January 2003. Records unavailable for review. His last GTC was around 5 years ago. He continued to have "petit mals" several times a day where his eyes would roll back, hands would shake for 30-45 seconds, if standing he would fall and had required sutures and staples in the past. He had been having the "petit mals" several times daily until 3 weeks ago when he started having a different type of episode and the petit mals "completely stopped." He was brought to the ER on 12/07/13 when his parents awoke to him dry having and speaking gibberish. He would be unable to speak, control his limbs, and cannot stand up without assistance. He can hear people around him but his jaw feels tight. The episodes can last for several hours. He has had 4 episodes in the past 3 weeks. The patient reports that they have been occurring only during the weekends, however his parents remind him he had one on Wednesday. He went to his neurologist's office on 09/10 where he had an episode while walking to the exam room where he became dizzy and fell. His father was able to catch him, and they reported his speech became affected. He was noted to be speaking in a whisper throughout the visit. He does note that he becomes more dizzy after taking his medications. Lamictal level was 17.8, Vimpat level 7.3.   The patient lives with his parents and brother. He is on disability and mostly stays at home playing  video games. He graduated high school, no special ed classes. He endorses olfactory hallucinations but cannot describe it except saying they are the same all the time.   Epilepsy Risk Factors: He had a skull fracture on the right side after a fall at 11 months of age. Otherwise he had a normal birth and early development. There is no history of febrile convulsions, CNS infections such as meningitis/encephalitis, neurosurgical procedures, or family history of seizures.  Prior AEDs:  Depakote, Dilantin, Zonegran  EEGs: 48-hour EEG (03/17/14 to 03/19/14) abnormal with multifocal discharges and right temporal slowing. Prior records note "generalized seizures, multiple foci" He had a 72-hour EEG (09/26/16 to 09/29/16) which was abnormal with bursts and runs of focal slowing over the right temporal region, bursts and runs of diffuse rhythmic slowing with intermixed generalized spikes lasting up to 120 seconds without clinical correlate, multifocal epileptiform discharges seen over the right temporal, left frontal regions, as well as generalized spikes and polyspikes. During sleep, there were bursts of generalized fast activity. There were 2 clinicoelectrographic seizures captured with right head turn and some hypermotor activity that were non-lateralizing on EEG with diffuse background suppression at seizure onset.  MRI: I personally reviewed MRI brain with and without contrast done 12/26/2013 which did not show any acute intracranial abnormality, hippocampi symmetric without abnormal signal or enhancement.  PAST MEDICAL HISTORY: Past Medical History:  Diagnosis Date  . History of kidney stones   . Seizures (HCC)    daily    MEDICATIONS: Current Outpatient Prescriptions on File Prior to Visit  Medication Sig Dispense Refill  . lamoTRIgine (LAMICTAL) 200 MG tablet TAKE ONE TABLET BY MOUTH THREE TIMES DAILY EVERY DAY 90 tablet 11  . levETIRAcetam (KEPPRA) 1000 MG tablet Take 2 & 1/2 tablets BY MOUTH TWICE DAILY 150 tablet 11  . naproxen (NAPROSYN) 500 MG tablet Take 1 tablet (500 mg total) by mouth 2 (two) times daily. 30 tablet 0  . ONFI 10 MG tablet TAKE 2 TABLETS BY MOUTH EVERY MORNING AND 3 TABLETS AT BEDTIME 150 tablet 5  . pregabalin (LYRICA) 225 MG capsule Take 1 capsule (225 mg total) by mouth 3 (three) times daily. 270 capsule 6  . VIMPAT 50 MG TABS tablet TAKE 1 TABLET BY MOUTH EVERY MORNING AND 2 TABLETS EVERY EVENING 90 tablet 5   No current facility-administered  medications on file prior to visit.     ALLERGIES: Allergies  Allergen Reactions  . Penicillins Rash    Has patient had a PCN reaction causing immediate rash, facial/tongue/throat swelling, SOB or lightheadedness with hypotension: No Has patient had a PCN reaction causing severe rash involving mucus membranes or skin necrosis: No Has patient had a PCN reaction that required hospitalization No Has patient had a PCN reaction occurring within the last 10 years: No If all of the above answers are "NO", then may proceed with Cephalosporin use.     FAMILY HISTORY: Family History  Problem Relation Age of Onset  . Healthy Mother   . Healthy Father     SOCIAL HISTORY: Social History   Social History  . Marital status: Single    Spouse name: N/A  . Number of children: 0  . Years of education: HS   Occupational History  . Not on file.   Social History Main Topics  . Smoking status: Former Smoker    Packs/day: 0.50    Years: 4.00    Types: Cigarettes    Quit date: 03/02/1985  . Smokeless tobacco: Never  Used  . Alcohol use No  . Drug use: No  . Sexual activity: Not on file   Other Topics Concern  . Not on file   Social History Narrative   Patient is single and lives with his parents.   Patient has a high school education.   Patient is right-handed.   Patient does not have any children.   Patient is on disability.   Patient drinks three sodas daily.    REVIEW OF SYSTEMS: Constitutional: No fevers, chills, or sweats, no generalized fatigue, change in appetite Eyes: No visual changes, double vision, eye pain Ear, nose and throat: No hearing loss, ear pain, nasal congestion, sore throat Cardiovascular: No chest pain, palpitations Respiratory:  No shortness of breath at rest or with exertion, wheezes GastrointestinaI: No nausea, vomiting, diarrhea, abdominal pain, fecal incontinence Genitourinary:  No dysuria, urinary retention or frequency Musculoskeletal:  No neck  pain, back pain Integumentary: No rash, pruritus, skin lesions Neurological: as above Psychiatric: No depression, insomnia, anxiety Endocrine: No palpitations, fatigue, diaphoresis, mood swings, change in appetite, change in weight, increased thirst Hematologic/Lymphatic:  No anemia, purpura, petechiae. Allergic/Immunologic: no itchy/runny eyes, nasal congestion, recent allergic reactions, rashes  PHYSICAL EXAM: Vitals:   01/04/17 1308  BP: 132/86  Pulse: 98  SpO2: 92%   General: No acute distress Head:  Normocephalic/atraumatic Neck: supple, no paraspinal tenderness, full range of motion Heart:  Regular rate and rhythm Lungs:  Clear to auscultation bilaterally Back: No paraspinal tenderness Skin/Extremities: No rash, no edema Neurological Exam: alert and oriented to person, place, and time. No aphasia or dysarthria. Fund of knowledge is appropriate.  Recent and remote memory are intact. Attention and concentration are normal.    Able to name objects and repeat phrases. Cranial nerves: Pupils equal, round, reactive to light. Extraocular movements intact with no nystagmus. Visual fields full. Facial sensation intact. No facial asymmetry. Tongue, uvula, palate midline.  Motor: Bulk and tone normal, muscle strength 5/5 throughout with no pronator drift.  Sensation to light touch intact.  No extinction to double simultaneous stimulation.  Deep tendon reflexes 2+ throughout, toes downgoing.  Finger to nose testing intact.  Gait narrow-based and steady, able to tandem walk adequately.  Romberg negative.  VNS Therapy Management: Parameters Output Current (mA): 2.000 Signal Frequency (Hz): 20 Pulse Width (usec): 250 Signal ON Time (sec): 30 Signal OFF Time (min): 1.8 Magnet Output Current (mA): 2.250 Magnet ON Time (sec): 60 Magnet Pulse Width (usec): 250 AutoStim Output Current (mA): 2.250 AutoStim Pulse Width (usec): 250 AutoStim ON Time (sec): 60 Tachycardia Detection : On Heartbeat  Detection Sensitivity: 3 Perform Verify Heartbeat Detection: no Threshold for AutoStim (%): 20  IMPRESSION: This is a 41 yo RH man with a history multifocal epilepsy. Family reports seizures started at age 74. He reported continued multiple daily episodes of "petit mals" despite high doses of AEDs, currently on 5 AEDs. He underwent VNS placement last 03/10/15. His mother continues to report daily seizures, his 72-hour EEG was abnormal consistent with multifocal epilepsy, there were 2 seizures captured which were nonlateralizing on EEG but clinically possibly left hemisphere onset with right head turn. We are continuing to adjust VNS settings, output current increased to 2.0 mA today. He has not had any falls. His mother is also concerned about daytime drowsiness, he is on multiple medications that can cause drowsiness, however he also has risk factors for sleep apnea. Sleep study will be ordered. They are agreeable to referral to [facility] for intractable epilepsy  and other potential management options, he was previously told at [facility] that he was not a surgical candidate (?RNS). Continue Onfi  in AM,  in PM and Vimpat  in AM,  in PM, Lamictal  TID, Lyrica  TID, and Keppra  BID for now. He will follow-up in 3 months and knows to call our office for any problems.   Thank you for allowing me to participate in his care.  Please do not hesitate to call for any questions or concerns.  The duration of this appointment visit was 25 minutes of face-to-face time with the patient.  Greater than 50% of this time was spent in counseling, explanation of diagnosis, planning of further management, and coordination of care.   Patrcia Dolly, M.D.   CC: Dr. Durene Cal

## 2017-01-04 NOTE — Patient Instructions (Signed)
1. Schedule sleep study 2. Refer to [facility] clinic for further evaluation of intractable epilepsy 3. Continue current medications 4. Follow-up in 3 months, call for any changes  Seizure Precautions: 1. If medication has been prescribed for you to prevent seizures, take it exactly as directed.  Do not stop taking the medicine without talking to your doctor first, even if you have not had a seizure in a long time.   2. Avoid activities in which a seizure would cause danger to yourself or to others.  Don't operate dangerous machinery, swim alone, or climb in high or dangerous places, such as on ladders, roofs, or girders.  Do not drive unless your doctor says you may.  3. If you have any warning that you may have a seizure, lay down in a safe place where you can't hurt yourself.    4.  No driving for 6 months from last seizure, as per [facility].   Please refer to the following link on the Epilepsy Foundation of America's website for more information: http://www.epilepsyfoundation.org/answerplace/Social/driving/drivingu.cfm   5.  Maintain good sleep hygiene. Avoid alcohol.  6.  Contact your doctor if you have any problems that may be related to the medicine you are taking.  7.  Call 911 and bring the patient back to the ED if:        A.  The seizure lasts longer than 5 minutes.       B.  The patient doesn't awaken shortly after the seizure  C.  The patient has new problems such as difficulty seeing, speaking or moving  D.  The patient was injured during the seizure  E.  The patient has a temperature over 102 F (39C)  F.  The patient vomited and now is having trouble breathing

## 2017-01-09 ENCOUNTER — Other Ambulatory Visit: Payer: Self-pay

## 2017-01-09 ENCOUNTER — Encounter: Payer: Self-pay | Admitting: Neurology

## 2017-01-09 DIAGNOSIS — G40211 Localization-related (focal) (partial) symptomatic epilepsy and epileptic syndromes with complex partial seizures, intractable, with status epilepticus: Secondary | ICD-10-CM

## 2017-01-10 ENCOUNTER — Telehealth: Payer: Self-pay | Admitting: Neurology

## 2017-01-10 NOTE — Telephone Encounter (Signed)
Patient's mother called. She was requesting that the Referral for [facility] be made in January Please. He will have new Insurance in January. Thanks

## 2017-01-11 NOTE — Telephone Encounter (Signed)
We have no control over when [facility] will schedule.  They will call pt/mother and they can express their desire to them.

## 2017-01-17 ENCOUNTER — Telehealth: Payer: Self-pay | Admitting: Neurology

## 2017-01-17 ENCOUNTER — Ambulatory Visit: Payer: PPO

## 2017-01-17 NOTE — Telephone Encounter (Signed)
Mother called to let Dr. Karel Jarvis know that he has an appointment with [facility] on 02/28/17. Thanks

## 2017-01-17 NOTE — Telephone Encounter (Signed)
Noted  

## 2017-01-26 ENCOUNTER — Ambulatory Visit (INDEPENDENT_AMBULATORY_CARE_PROVIDER_SITE_OTHER): Payer: PPO

## 2017-01-26 DIAGNOSIS — Z23 Encounter for immunization: Secondary | ICD-10-CM

## 2017-02-17 NOTE — Progress Notes (Signed)
Pt's father stopped by the office and [place] [employer] [place] [place] for incapacitated [place].  Paperwork left on Dr. Rosalyn GessAquino's desk.

## 2017-02-27 ENCOUNTER — Telehealth: Payer: Self-pay | Admitting: Neurology

## 2017-02-27 NOTE — Telephone Encounter (Signed)
Patient mother wanted to know if the paper work for her to carry him on her dental ins is ready please call her

## 2017-02-27 NOTE — Telephone Encounter (Signed)
Returned call.  LMOM letting pt's mother know that paperwork is ready at the front desk.

## 2017-02-28 ENCOUNTER — Telehealth: Payer: Self-pay | Admitting: Neurology

## 2017-02-28 DIAGNOSIS — G4719 Other hypersomnia: Secondary | ICD-10-CM | POA: Diagnosis not present

## 2017-02-28 DIAGNOSIS — G40211 Localization-related (focal) (partial) symptomatic epilepsy and epileptic syndromes with complex partial seizures, intractable, with status epilepticus: Secondary | ICD-10-CM | POA: Diagnosis not present

## 2017-02-28 NOTE — Telephone Encounter (Signed)
Patient's mother called to say that Gregg Young has not been notified regarding his Sleep Study. Please Call. Thanks

## 2017-03-02 ENCOUNTER — Telehealth: Payer: Self-pay | Admitting: Neurology

## 2017-03-02 ENCOUNTER — Other Ambulatory Visit: Payer: Self-pay

## 2017-03-02 DIAGNOSIS — R400 Somnolence: Secondary | ICD-10-CM

## 2017-03-02 NOTE — Telephone Encounter (Signed)
Referred placed again to pulmonology

## 2017-03-02 NOTE — Telephone Encounter (Signed)
Patient mother states patient was to have as sleep study done but they have not heard anything form anyone please call

## 2017-03-03 ENCOUNTER — Other Ambulatory Visit: Payer: Self-pay | Admitting: Neurology

## 2017-03-03 DIAGNOSIS — G40211 Localization-related (focal) (partial) symptomatic epilepsy and epileptic syndromes with complex partial seizures, intractable, with status epilepticus: Secondary | ICD-10-CM

## 2017-03-06 ENCOUNTER — Other Ambulatory Visit: Payer: Self-pay | Admitting: Neurology

## 2017-03-06 DIAGNOSIS — G40211 Localization-related (focal) (partial) symptomatic epilepsy and epileptic syndromes with complex partial seizures, intractable, with status epilepticus: Secondary | ICD-10-CM

## 2017-03-07 ENCOUNTER — Telehealth: Payer: Self-pay

## 2017-03-07 NOTE — Telephone Encounter (Signed)
Patient's mother called to check status of referral. I informed her that they tried to contact them on 03/03/17 and LMOM. I Confirmed her phone #'s and she gave me a new cell phone # [phone]. She asked was there any way they could get that new number? Thanks

## 2017-03-07 NOTE — Telephone Encounter (Signed)
Received call from pt's mother who states that her husband [facility] the [facility].  Gave pt's mother the phone number for scheduling.    Pt [facility]  He is scheduled to be admitted in January for 5-7 days for testing and possible surgery in February - did not express what type of surgery, just wanted to make Dr. Karel JarvisAquino aware.

## 2017-04-07 ENCOUNTER — Ambulatory Visit: Payer: PPO | Admitting: Neurology

## 2017-04-11 ENCOUNTER — Institutional Professional Consult (permissible substitution): Payer: PPO | Admitting: Pulmonary Disease

## 2017-04-26 DIAGNOSIS — G40209 Localization-related (focal) (partial) symptomatic epilepsy and epileptic syndromes with complex partial seizures, not intractable, without status epilepticus: Secondary | ICD-10-CM | POA: Diagnosis not present

## 2017-04-26 DIAGNOSIS — G40109 Localization-related (focal) (partial) symptomatic epilepsy and epileptic syndromes with simple partial seizures, not intractable, without status epilepticus: Secondary | ICD-10-CM | POA: Diagnosis not present

## 2017-04-26 DIAGNOSIS — R569 Unspecified convulsions: Secondary | ICD-10-CM | POA: Diagnosis not present

## 2017-04-26 DIAGNOSIS — G40219 Localization-related (focal) (partial) symptomatic epilepsy and epileptic syndromes with complex partial seizures, intractable, without status epilepticus: Secondary | ICD-10-CM | POA: Diagnosis not present

## 2017-05-01 MED ORDER — ENOXAPARIN SODIUM 40 MG/0.4ML ~~LOC~~ SOLN
40.00 | SUBCUTANEOUS | Status: DC
Start: 2017-05-01 — End: 2017-05-01

## 2017-05-01 MED ORDER — LACOSAMIDE 100 MG PO TABS
100.00 | ORAL_TABLET | ORAL | Status: DC
Start: ? — End: 2017-05-01

## 2017-05-01 MED ORDER — CLOBAZAM 10 MG PO TABS
20.00 | ORAL_TABLET | ORAL | Status: DC
Start: 2017-05-02 — End: 2017-05-01

## 2017-05-01 MED ORDER — CLOBAZAM 10 MG PO TABS
30.00 | ORAL_TABLET | ORAL | Status: DC
Start: 2017-05-01 — End: 2017-05-01

## 2017-05-01 MED ORDER — GENERIC EXTERNAL MEDICATION
2000.00 | Status: DC
Start: ? — End: 2017-05-01

## 2017-05-01 MED ORDER — LACOSAMIDE 50 MG PO TABS
50.00 | ORAL_TABLET | ORAL | Status: DC
Start: 2017-05-02 — End: 2017-05-01

## 2017-05-01 MED ORDER — SODIUM CHLORIDE 0.9 % IJ SOLN
5.00 | INTRAMUSCULAR | Status: DC
Start: 2017-05-01 — End: 2017-05-01

## 2017-05-01 MED ORDER — ACETAMINOPHEN 325 MG PO TABS
650.00 | ORAL_TABLET | ORAL | Status: DC
Start: ? — End: 2017-05-01

## 2017-05-01 MED ORDER — DOCUSATE SODIUM 100 MG PO CAPS
100.00 | ORAL_CAPSULE | ORAL | Status: DC
Start: ? — End: 2017-05-01

## 2017-05-01 MED ORDER — LIDOCAINE HCL 1 % IJ SOLN
0.50 | INTRAMUSCULAR | Status: DC
Start: ? — End: 2017-05-01

## 2017-05-08 ENCOUNTER — Encounter: Payer: Self-pay | Admitting: Internal Medicine

## 2017-05-08 ENCOUNTER — Ambulatory Visit (INDEPENDENT_AMBULATORY_CARE_PROVIDER_SITE_OTHER): Payer: PPO | Admitting: Internal Medicine

## 2017-05-08 ENCOUNTER — Institutional Professional Consult (permissible substitution): Payer: PPO | Admitting: Pulmonary Disease

## 2017-05-08 VITALS — BP 126/80 | HR 89 | Ht 66.0 in | Wt 250.4 lb

## 2017-05-08 DIAGNOSIS — G40909 Epilepsy, unspecified, not intractable, without status epilepticus: Secondary | ICD-10-CM | POA: Diagnosis not present

## 2017-05-08 DIAGNOSIS — G4733 Obstructive sleep apnea (adult) (pediatric): Secondary | ICD-10-CM | POA: Diagnosis not present

## 2017-05-08 DIAGNOSIS — G40209 Localization-related (focal) (partial) symptomatic epilepsy and epileptic syndromes with complex partial seizures, not intractable, without status epilepticus: Secondary | ICD-10-CM | POA: Diagnosis not present

## 2017-05-08 NOTE — Assessment & Plan Note (Signed)
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 He will need an in-center sleep study as discussed.

## 2017-05-08 NOTE — Assessment & Plan Note (Addendum)
High probability diagnosis based on history and physical exam with significant complication of ongoing seizure disorder. Plan-split protocol in-center NPSG

## 2017-05-08 NOTE — Progress Notes (Signed)
05/08/17-42 year old male for sleep evaluation. Sleep consult referred by Dr Wilmon Arms had sleep study,wakes up several times per night.  No prior sleep study.  Here with father. 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 Karel Jarvis. He recently spent several days hospitalized at [facility] for continuous EEG.  The doctors there requested to get a sleep study but he already had this appointment for evaluation here.  Father says seizures are not confined to sleep. They report loud snoring, not sure about witnessed apneas.  Daytime sleepiness. No ENT surgery, lung or heart disease.  No sleep medicines other than seizure meds. Usual bedtime anywhere between 11 PM and 1:30 AM, watching TV to help sleep.  Sleep latency 15-20 minutes, waking twice during the night. Epworth score 10/24  Prior to Admission medications   Medication Sig Start Date End Date Taking? Authorizing Provider  lamoTRIgine (LAMICTAL) 100 MG tablet TAKE 2 TABLETS BY MOUTH 3 TIMES DAILY 03/03/17  Yes Van Clines, MD  levETIRAcetam (KEPPRA) 1000 MG tablet TAKE 2.5 TABLETS BY MOUTH 2 TIMES DAILY 03/03/17  Yes Van Clines, MD  ONFI 10 MG tablet TAKE 2 TABLETS BY MOUTH EVERY MORNING AND 3 tablets AT BEDTIME 03/06/17  Yes Van Clines, MD  pregabalin (LYRICA) 225 MG capsule Take 1 capsule (225 mg total) by mouth 3 (three) times daily. 11/08/16  Yes Van Clines, MD  VIMPAT 50 MG TABS tablet TAKE 1 TABLET BY MOUTH EVERY MORNING AND 2 tablets EVERY EVENING 03/03/17  Yes Van Clines, MD   Past Medical History:  Diagnosis Date  . History of kidney stones   . Seizures (HCC)    daily   Past Surgical History:  Procedure Laterality Date  . HERNIA REPAIR     baby  . VAGUS NERVE STIMULATOR INSERTION Left 03/10/2015   Procedure: LEFT SIDED PLACEMENT VAGAL NERVE STIMULATOR IMPLANT;  Surgeon: Lisbeth Renshaw, MD;  Location: [facility];  Service: Neurosurgery;   Laterality: Left;   Family History  Problem Relation Age of Onset  . Healthy Mother   . Healthy Father    Social History   Socioeconomic History  . Marital status: Single    Spouse name: Not on file  . Number of children: 0  . Years of education: HS  . Highest education level: Not on file  Social Needs  . Financial resource strain: Not on file  . Food insecurity - worry: Not on file  . Food insecurity - inability: Not on file  . Transportation needs - medical: Not on file  . Transportation needs - non-medical: Not on file  Occupational History  . Not on file  Tobacco Use  . Smoking status: Former Smoker    Packs/day: 0.50    Years: 4.00    Pack years: 2.00    Types: Cigarettes    Last attempt to quit: 03/02/1985    Years since quitting: 32.2  . Smokeless tobacco: Never Used  Substance and Sexual Activity  . Alcohol use: No    Alcohol/week: 0.0 oz  . Drug use: No  . Sexual activity: Not on file  Other Topics Concern  . Not on file  Social History Narrative   Patient is single and lives with his parents.   Patient has a high school education.   Patient is right-handed.   Patient does not have any children.   Patient is on disability.   Patient drinks three sodas daily.   ROS-see HPI  Negative unless "+" Constitutional:    weight loss, night sweats, fevers, chills, fatigue, lassitude. HEENT:    headaches, difficulty swallowing, tooth/dental problems, sore throat,       sneezing, itching, ear ache, nasal congestion, post nasal drip, snoring CV:    chest pain, orthopnea, PND, swelling in lower extremities, anasarca,                                                    dizziness, palpitations Resp:   shortness of breath with exertion or at rest.                productive cough,   non-productive cough, coughing up of blood.              change in color of mucus.  wheezing.   Skin:    rash or lesions. GI:  No-   heartburn, indigestion, abdominal pain, nausea, vomiting,  diarrhea,                 change in bowel habits, loss of appetite GU: dysuria, change in color of urine, no urgency or frequency.   flank pain. MS:   joint pain, stiffness, decreased range of motion, back pain. Neuro-     nothing unusual Psych:  change in mood or affect.  +depression or anxiety.   memory loss.  OBJ- Physical Exam General- Alert, Oriented, Affect-appropriate, Distress- none acute, + obese, thick neck Skin- rash-none, lesions- none, excoriation- none Lymphadenopathy- none Head- atraumatic            Eyes- Gross vision intact, PERRLA, conjunctivae and secretions clear            Ears- Hearing, canals-normal            Nose- Clear, no-Septal dev, mucus, polyps, erosion, perforation             Throat- Mallampati IV , mucosa clear , drainage- none, tonsils + residual Neck- flexible , trachea midline, no stridor , thyroid nl, carotid no bruit Chest - symmetrical excursion , unlabored           Heart/CV- RRR , no murmur , no gallop  , no rub, nl s1 s2                           - JVD- none , edema- none, stasis changes- none, varices- none           Lung- clear to P&A, wheeze- none, cough- none , dullness-none, rub- none           Chest wall-  Abd-  Br/ Gen/ Rectal- Not done, not indicated Extrem- cyanosis- none, clubbing, none, atrophy- none, strength- nl Neuro- grossly intact to observation

## 2017-05-08 NOTE — Addendum Note (Signed)
Addended by: Boone MasterJONES, Jaselle Pryer E on: 05/08/2017 03:59 PM   Modules accepted: Orders

## 2017-05-08 NOTE — Patient Instructions (Signed)
Order- schedule  NPSG split night protocol  Dx OSA, epilepsy

## 2017-05-18 ENCOUNTER — Encounter (HOSPITAL_BASED_OUTPATIENT_CLINIC_OR_DEPARTMENT_OTHER): Payer: PPO

## 2017-05-18 ENCOUNTER — Ambulatory Visit (HOSPITAL_BASED_OUTPATIENT_CLINIC_OR_DEPARTMENT_OTHER): Payer: PPO | Attending: Internal Medicine | Admitting: Internal Medicine

## 2017-05-18 VITALS — Ht 68.0 in | Wt 240.0 lb

## 2017-05-18 DIAGNOSIS — G40909 Epilepsy, unspecified, not intractable, without status epilepticus: Secondary | ICD-10-CM

## 2017-05-18 DIAGNOSIS — G4733 Obstructive sleep apnea (adult) (pediatric): Secondary | ICD-10-CM | POA: Diagnosis not present

## 2017-05-21 DIAGNOSIS — G4733 Obstructive sleep apnea (adult) (pediatric): Secondary | ICD-10-CM | POA: Diagnosis not present

## 2017-05-21 NOTE — Procedures (Signed)
Patient Name: Quintrell, Baze Date: 05/18/2017 Gender: Male D.O.B: July 13, 1975 Age (years): 58 Referring Provider: Baird Lyons MD, ABSM Height (inches): 68 Interpreting Physician: Baird Lyons MD, ABSM Weight (lbs): 240 RPSGT: Zadie Rhine BMI: 36 MRN: [id number] Neck Size: 19.00  CLINICAL INFORMATION The patient is referred for a split night study with BPAP titration and seizure montage.  MEDICATIONS Medications self-administered by patient taken the night of the study : LAMICTAL, KEPPRA, ONFI, LYRICA, VIMPAT  SLEEP STUDY TECHNIQUE As per the AASM Manual for the Scoring of Sleep and Associated Events v2.3 (April 2016) with a hypopnea requiring 4% desaturations.  The channels recorded and monitored were frontal, central and occipital EEG, electrooculogram (EOG), submentalis EMG (chin), nasal and oral airflow, thoracic and abdominal wall motion, anterior tibialis EMG, snore microphone, electrocardiogram, and pulse oximetry. Bi-level positive airway pressure (BiPAP) was initiated when the patient met split night criteria and was titrated according to treat sleep-disordered breathing.  RESPIRATORY PARAMETERS Diagnostic  Total AHI (/hr): 36.6 RDI (/hr): 46.1 OA Index (/hr): 1.4 CA Index (/hr): 1.0 REM AHI (/hr): 13.3 NREM AHI (/hr): 39.4 Supine AHI (/hr): 41.7 Non-supine AHI (/hr): 45.39 Min O2 Sat (%): 84.00 Mean O2 (%): 92.05 Time below 88% (min): 21.8   Titration  Optimal IPAP Pressure (cm): 18 Optimal EPAP Pressure (cm): 14 AHI at Optimal Pressure (/hr): 46.7 Min O2 at Optimal Pressure (%): 87.0 Sleep % at Optimal (%): 99 Supine % at Optimal (%): 100   SLEEP ARCHITECTURE The study was initiated at 10:28:52 PM and terminated at 5:06:54 AM. The total recorded time was 398.0 minutes. EEG confirmed total sleep time was 320.5 minutes yielding a sleep efficiency of 80.5%. Sleep onset after lights out was 4.0 minutes with a REM latency of 115.5 minutes. The patient spent  20.28% of the night in stage N1 sleep, 46.96% in stage N2 sleep, 17.16% in stage N3 and 15.60% in REM. Wake after sleep onset (WASO) was 73.6 minutes. The Arousal Index was 42.1/hour.  LEG MOVEMENT DATA The total Periodic Limb Movements of Sleep (PLMS) were 52. The PLMS index was 9.73 .  CARDIAC DATA The 2 lead EKG demonstrated sinus rhythm. The mean heart rate was 67.42 beats per minute. Other EKG findings include: None.  IMPRESSIONS - Severe Obstructive sleep apnea occurred during the diagnostic portion of the study (AHI = 36.6 /hour).  - Moderate central sleep apnea occurred during the diagnostic portion of the study (CAI = 1.0/hour). - Complex Apnea pattern as Central apneas appeared with CPAP, requiring change to BIPAP titration. Final setting BIPAP 18/ 14. - Severe oxygen desaturation was noted during the diagnostic portion of the study (Min O2 = 84.00%). - The patient snored with soft snoring volume during the diagnostic portion of the study. - No cardiac abnormalities were noted during this study. - Clinically significant periodic limb movements of sleep did not occur during the study. - Seizure activity was not seen.  DIAGNOSIS - Obstructive Sleep Apnea (327.23 [G47.33 ICD-10])  RECOMMENDATIONS - Trial of BiPAP therapy on 18/14 cm H2O. Patient used a Medium size Resmed Full Face Mask AirFit F20 mask and heated humidification. - Be careful with alcohol, sedatives and other CNS depressants that may worsen sleep apnea and disrupt normal sleep architecture. - Sleep hygiene should be reviewed to assess factors that may improve sleep quality. - Weight management and regular exercise should be initiated or continued.  [Electronically signed] 05/21/2017 11:42 AM  Baird Lyons MD, ABSM Diplomate, American Board of Sleep Medicine  NPI: [id number]                          [place], American Board of Sleep Medicine  ELECTRONICALLY SIGNED ON:   05/21/2017, 11:34 AM [place] PH: [phone]   FX: [phone] [place]

## 2017-05-23 DIAGNOSIS — G40219 Localization-related (focal) (partial) symptomatic epilepsy and epileptic syndromes with complex partial seizures, intractable, without status epilepticus: Secondary | ICD-10-CM | POA: Diagnosis not present

## 2017-05-29 ENCOUNTER — Other Ambulatory Visit: Payer: Self-pay | Admitting: Neurology

## 2017-05-29 DIAGNOSIS — G40211 Localization-related (focal) (partial) symptomatic epilepsy and epileptic syndromes with complex partial seizures, intractable, with status epilepticus: Secondary | ICD-10-CM

## 2017-06-02 ENCOUNTER — Telehealth: Payer: Self-pay | Admitting: Internal Medicine

## 2017-06-02 DIAGNOSIS — G4733 Obstructive sleep apnea (adult) (pediatric): Secondary | ICD-10-CM

## 2017-06-02 NOTE — Telephone Encounter (Signed)
ATC pt, no answer. Left message for pt to call back.  

## 2017-06-02 NOTE — Telephone Encounter (Signed)
His sleep study showed severe Complex Sleep Apnea, with obstructive and central apneas averaging 36 times per hour. Blood oxygen levels dropped with these events.  I recommend we order new DME, new BIPAP ST 18/ 14, PS 3, back up rate 5, mask of choice, humidifier, supplies, Airview  Please make sure he has a return ov scheduled in 31-90 days

## 2017-06-02 NOTE — Telephone Encounter (Signed)
Called and spoke with pt's mom Steward DroneBrenda going over the results of pt's sleep study.  Steward DroneBrenda expressed understanding. Order placed for pt to begin bipap.  Pt already had an appt scheduled with CY 08/2017 so keeping that current appt.  Nothing further needed at this current time.

## 2017-06-02 NOTE — Telephone Encounter (Signed)
Pt's mother Steward DroneBrenda (dpr on file) requesting pt's sleep study results.   CY please advise.  Thanks!

## 2017-06-02 NOTE — Telephone Encounter (Signed)
Patient's mother returning call, CB is [phone] or [phone].

## 2017-06-16 ENCOUNTER — Encounter: Payer: Self-pay | Admitting: Neurology

## 2017-06-16 ENCOUNTER — Ambulatory Visit (INDEPENDENT_AMBULATORY_CARE_PROVIDER_SITE_OTHER): Payer: PPO | Admitting: Neurology

## 2017-06-16 ENCOUNTER — Ambulatory Visit: Payer: PPO | Admitting: Neurology

## 2017-06-16 VITALS — BP 110/74 | HR 74 | Resp 16 | Ht 68.0 in | Wt 256.0 lb

## 2017-06-16 DIAGNOSIS — G40211 Localization-related (focal) (partial) symptomatic epilepsy and epileptic syndromes with complex partial seizures, intractable, with status epilepticus: Secondary | ICD-10-CM

## 2017-06-16 DIAGNOSIS — G4733 Obstructive sleep apnea (adult) (pediatric): Secondary | ICD-10-CM | POA: Diagnosis not present

## 2017-06-16 NOTE — Progress Notes (Signed)
NEUROLOGY FOLLOW UP OFFICE NOTE  Gregg Young [id number]  DOB: 1975-08-21  HISTORY OF PRESENT ILLNESS: I had the pleasure of seeing Gregg Young in follow-up in the neurology clinic on 06/16/2017.  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 He is again accompanied by his parents who help supplement the history today. Since his last visit, he has been evaluated at [facility] with inpatient video EEG monitoring done last Jan 23-28, 2019. During his stay, there were 4 seizures captured that were of diffuse onset and indeterminate lateralization, semiology favored left mesial frontal onset. Baseline EEG showed spike-wave discharges, at times in runs at 1 Hz with broad distribution, frequent runs of bifrontally rhythmic delta activity, occasional GPFA (L>R) during sleep. His Keppra dose was reduced to 2000mg  BID, he was discharged home on prior doses of Onfi 20mg  in AM, 30mg  in PM and Vimpat 50mg  in AM, 100mg  in PM, Lamictal 200mg  TID, Lyrica 225mg  TID. Previous attempts at taper by his prior neurologists have led to status epilepticus. He is awaiting MRI brain with special epilepsy protocol. Intracranial EEG recording was discussed with Perlie GoldRussell and his family, he is hesitant about it today, we discussed his concerns. He has also had a sleep study done showing severe OSA, moderate central sleep apnea. He is awaiting BiPAP machine. Since his last visit, his mother reports daily seizures still, a lot occurring between the hours of 6-9pm. He has changed his medication regimen, waking up in the morning to take his AM meds, then going back to sleep.   He denies any dizziness, headaches, focal numbness/tingling/weakness. He reports mood is good.   HPI: This is a 42 yo RH man with a history of seizures since age 42. He has no recollection of events, no prior warning, witnessed by his mother to have a generalized convulsion lasting 90 to 120 seconds. He was [facility]  [facility] then had another convulsion 2 days later. They recall trying different medications, Depakote caused liver dysfunction, he has failed Dilantin and Zonegran. He has been on Keppra, Lamictal, Lyrica, and most recently Vimpat. [facility], records unavailable for review, per notes he stayed for 36 hours and had generalized seizures, multiple foci. They report two admissions for status epilepticus, one in September 2002 in the setting of weaning off Depakote, and another in January 2003. Records unavailable for review. His last GTC was around 5 years ago. He continued to have "petit mals" several times a day where his eyes would roll back, hands would shake for 30-45 seconds, if standing he would fall and had required sutures and staples in the past. He had been having the "petit mals" several times daily until 3 weeks ago when he started having a different type of episode and the petit mals "completely stopped." He was brought to the ER on 12/07/13 when his parents awoke to him dry having and speaking gibberish. He would be unable to speak, control his limbs, and cannot stand up without assistance. He can hear people around him but his jaw feels tight. The episodes can last for several hours. He has had 4 episodes in the past 3 weeks. The patient reports that they have been occurring only during the weekends, however his parents remind him he had one on Wednesday. He went to his neurologist's office on 09/10 where he had an episode while walking to the exam room where he became dizzy and fell. His  father was able to catch him, and they reported his speech became affected. He was noted to be speaking in a whisper throughout the visit. He does note that he becomes more dizzy after taking his medications. Lamictal level was 17.8, Vimpat level 7.3.   The patient lives with his parents and brother. He is on disability and mostly stays at home playing video games. He graduated high  school, no special ed classes. He endorses olfactory hallucinations but cannot describe it except saying they are the same all the time.   Epilepsy Risk Factors: He had a skull fracture on the right side after a fall at 62 months of age. Otherwise he had a normal birth and early development. There is no history of febrile convulsions, CNS infections such as meningitis/encephalitis, neurosurgical procedures, or family history of seizures.  Prior AEDs: Depakote, Dilantin, Zonegran  EEGs: 48-hour EEG (03/17/14 to 03/19/14) abnormal with multifocal discharges and right temporal slowing. Prior records note "generalized seizures, multiple foci" He had a 72-hour EEG (09/26/16 to 09/29/16) which was abnormal with bursts and runs of focal slowing over the right temporal region, bursts and runs of diffuse rhythmic slowing with intermixed generalized spikes lasting up to 120 seconds without clinical correlate, multifocal epileptiform discharges seen over the right temporal, left frontal regions, as well as generalized spikes and polyspikes. During sleep, there were bursts of generalized fast activity. There were 2 clinicoelectrographic seizures captured with right head turn and some hypermotor activity that were non-lateralizing on EEG with diffuse background suppression at seizure onset.  MRI: I personally reviewed MRI brain with and without contrast done 12/26/2013 which did not show any acute intracranial abnormality, hippocampi symmetric without abnormal signal or enhancement.  PAST MEDICAL HISTORY: Past Medical History:  Diagnosis Date  . History of kidney stones   . Seizures (HCC)    daily    MEDICATIONS:  Outpatient Encounter Medications as of 06/16/2017  Medication Sig  . cloBAZam (ONFI) 10 MG tablet TAKE 2 TABLETS BY MOUTH EVERY MORNING AND 3 TABLETS AT BEDTIME  . lamoTRIgine (LAMICTAL) 100 MG tablet TAKE 2 TABLETS BY MOUTH 3 TIMES DAILY  . levETIRAcetam (KEPPRA) 1000 MG tablet TAKE 2.5 TABLETS  BY MOUTH 2 TIMES DAILY (Patient taking differently: Take 1,000 mg by mouth 2 (two) times daily. TAKE 2 TABLETS BY MOUTH 2 TIMES DAILY)  . LYRICA 225 MG capsule TAKE 1 CAPSULE BY MOUTH 3 TIMES DAILY  . VIMPAT 50 MG TABS tablet TAKE 1 TABLET BY MOUTH EVERY MORNING AND 2 tablets EVERY EVENING   No facility-administered encounter medications on file as of 06/16/2017.      ALLERGIES: Allergies  Allergen Reactions  . Penicillins Rash    Has patient had a PCN reaction causing immediate rash, facial/tongue/throat swelling, SOB or lightheadedness with hypotension: No Has patient had a PCN reaction causing severe rash involving mucus membranes or skin necrosis: No Has patient had a PCN reaction that required hospitalization No Has patient had a PCN reaction occurring within the last 10 years: No If all of the above answers are "NO", then may proceed with Cephalosporin use.     FAMILY HISTORY: Family History  Problem Relation Age of Onset  . Healthy Mother   . Healthy Father     SOCIAL HISTORY: Social History   Socioeconomic History  . Marital status: Single    Spouse name: Not on file  . Number of children: 0  . Years of education: HS  . Highest education level: Not on  file  Social Needs  . Financial resource strain: Not on file  . Food insecurity - worry: Not on file  . Food insecurity - inability: Not on file  . Transportation needs - medical: Not on file  . Transportation needs - non-medical: Not on file  Occupational History  . Not on file  Tobacco Use  . Smoking status: Former Smoker    Packs/day: 0.50    Years: 4.00    Pack years: 2.00    Types: Cigarettes    Last attempt to quit: 03/02/1985    Years since quitting: 32.3  . Smokeless tobacco: Never Used  Substance and Sexual Activity  . Alcohol use: No    Alcohol/week: 0.0 oz  . Drug use: No  . Sexual activity: Not on file  Other Topics Concern  . Not on file  Social History Narrative   Patient is single and  lives with his parents.   Patient has a high school education.   Patient is right-handed.   Patient does not have any children.   Patient is on disability.   Patient drinks three sodas daily.    REVIEW OF SYSTEMS: Constitutional: No fevers, chills, or sweats, no generalized fatigue, change in appetite Eyes: No visual changes, double vision, eye pain Ear, nose and throat: No hearing loss, ear pain, nasal congestion, sore throat Cardiovascular: No chest pain, palpitations Respiratory:  No shortness of breath at rest or with exertion, wheezes GastrointestinaI: No nausea, vomiting, diarrhea, abdominal pain, fecal incontinence Genitourinary:  No dysuria, urinary retention or frequency Musculoskeletal:  No neck pain, back pain Integumentary: No rash, pruritus, skin lesions Neurological: as above Psychiatric: No depression, insomnia, anxiety Endocrine: No palpitations, fatigue, diaphoresis, mood swings, change in appetite, change in weight, increased thirst Hematologic/Lymphatic:  No anemia, purpura, petechiae. Allergic/Immunologic: no itchy/runny eyes, nasal congestion, recent allergic reactions, rashes  PHYSICAL EXAM: Vitals:   06/16/17 1556  BP: 110/74  Pulse: 74  Resp: 16  SpO2: 97%   General: No acute distress Head:  Normocephalic/atraumatic Neck: supple, no paraspinal tenderness, full range of motion Heart:  Regular rate and rhythm Lungs:  Clear to auscultation bilaterally Back: No paraspinal tenderness Skin/Extremities: No rash, no edema Neurological Exam: alert and oriented to person, place, and time. No aphasia or dysarthria. Fund of knowledge is appropriate.  Recent and remote memory are intact. Attention and concentration are normal.    Able to name objects and repeat phrases. Cranial nerves: Pupils equal, round, reactive to light. Extraocular movements intact with no nystagmus. Visual fields full. Facial sensation intact. No facial asymmetry. Tongue, uvula, palate midline.   Motor: Bulk and tone normal, muscle strength 5/5 throughout with no pronator drift.  Sensation to light touch intact.  No extinction to double simultaneous stimulation.  Deep tendon reflexes 2+ throughout, toes downgoing.  Finger to nose testing intact.  Gait narrow-based and steady, able to tandem walk adequately.  Romberg negative.  VNS Therapy Management: Parameters Output Current (mA): 2.00 Signal Frequency (Hz): 20 Pulse Width (usec): 250 Signal ON Time (sec): 30 Signal OFF Time (min): 1.1 Magnet Output Current (mA): 2.250 Magnet ON Time (sec): 60 Magnet Pulse Width (usec): 250 AutoStim Output Current (mA): 2.250 AutoStim Pulse Width (usec): 250 AutoStim ON Time (sec): 60 Tachycardia Detection : On Heartbeat Detection Sensitivity: 3 Threshold for AutoStim (%): 20 Diagnostics Output Current: ok Current Delievered (mA): 2.00 Lead Impedance: OK Impedence Value (Ohms): 2386 Battery Status Indicator (color): Green     IMPRESSION: This is a 41 yo  RH man with a history multifocal epilepsy. Family reports seizures started at age 42. He reported continued multiple daily episodes of "petit mals" despite high doses of AEDs, currently on 5 AEDs. He underwent VNS placement last 03/10/15. His mother continues to report daily seizures. He underwent inpatient vEEG at [facility], EEG captured 4 seizures that were of diffuse onset and indeterminate lateralization, semiology favored left mesial frontal onset. He is awaiting MRI brain with special epilepsy protocol. He is hesitant about intracranial EEG recording, we discussed the procedure, he is interested in speaking with a patient who has undergone the study in the past, this will be relayed to Dr. Laveda Norman at [facility]. He has also been found to have complex sleep apnea on recent sleep study, and is awaiting BiPAP machine. Continue current AEDs, he is on Onfi 20mg  in AM, 30mg  in PM and Vimpat 50mg  in AM, 100mg  in PM, Lamictal 200mg  TID, Lyrica 225mg  TID, and  Keppra 2000mg  BID. VNS interrogated today, OFF time reduced to 1.1 min. He will follow-up in 6 months after [facility] visits and knows to call our office for any problems.   Thank you for allowing me to participate in his care.  Please do not hesitate to call for any questions or concerns.  The duration of this appointment visit was 25 minutes of face-to-face time with the patient.  Greater than 50% of this time was spent in counseling, explanation of diagnosis, planning of further management, and coordination of care.   Patrcia Dolly, M.D.   CC: Dr. Durene Cal

## 2017-06-16 NOTE — Patient Instructions (Signed)
1. Follow-up at [facility] as scheduled 2. Use CPAP once machine arrives 3.Continue all your medications, follow-up in 6 months, call for any changes  Seizure Precautions: 1. If medication has been prescribed for you to prevent seizures, take it exactly as directed.  Do not stop taking the medicine without talking to your doctor first, even if you have not had a seizure in a long time.   2. Avoid activities in which a seizure would cause danger to yourself or to others.  Don't operate dangerous machinery, swim alone, or climb in high or dangerous places, such as on ladders, roofs, or girders.  Do not drive unless your doctor says you may.  3. If you have any warning that you may have a seizure, lay down in a safe place where you can't hurt yourself.    4.  No driving for 6 months from last seizure, [place] per [facility].   Please refer to the following link on the Epilepsy Foundation of America's website for more information: http://www.epilepsyfoundation.org/answerplace/Social/driving/drivingu.cfm   5.  Maintain good sleep hygiene. Avoid alcohol.  6.  Contact your doctor if you have any problems that may be related to the medicine you are taking.  7.  Call 911 and bring the patient back to the ED if:        A.  The seizure lasts longer than 5 minutes.       B.  The patient doesn't awaken shortly after the seizure  C.  The patient has new problems such as difficulty seeing, speaking or moving  D.  The patient was injured during the seizure  E.  The patient has a temperature over 102 F (39C)  F.  The patient vomited and now is having trouble breathing

## 2017-06-26 ENCOUNTER — Encounter: Payer: Self-pay | Admitting: Neurology

## 2017-07-04 DIAGNOSIS — R569 Unspecified convulsions: Secondary | ICD-10-CM | POA: Diagnosis not present

## 2017-07-04 DIAGNOSIS — G40219 Localization-related (focal) (partial) symptomatic epilepsy and epileptic syndromes with complex partial seizures, intractable, without status epilepticus: Secondary | ICD-10-CM | POA: Diagnosis not present

## 2017-07-07 DIAGNOSIS — G4733 Obstructive sleep apnea (adult) (pediatric): Secondary | ICD-10-CM | POA: Diagnosis not present

## 2017-07-17 ENCOUNTER — Telehealth: Payer: Self-pay | Admitting: Neurology

## 2017-07-17 NOTE — Telephone Encounter (Signed)
Returned call.  No answer.  LMOM asking for return call to the office.  

## 2017-07-17 NOTE — Telephone Encounter (Signed)
Patient's mother called and needs to speak with you regarding recent test results that were given from [facility]. She has some questions. Please Call. Thanks

## 2017-07-19 ENCOUNTER — Telehealth: Payer: Self-pay | Admitting: Internal Medicine

## 2017-07-19 DIAGNOSIS — G4733 Obstructive sleep apnea (adult) (pediatric): Secondary | ICD-10-CM

## 2017-07-19 NOTE — Telephone Encounter (Signed)
ATC pt, no answer. Left message for pt to call back.  

## 2017-07-20 NOTE — Telephone Encounter (Signed)
Pt mother is calling back. CB is [phone].

## 2017-07-20 NOTE — Telephone Encounter (Signed)
Called and spoke with pt's mother Steward DroneBrenda.  Pt's mother states pt cannot wear the cpap mask well, having a hard time with it due to past medical history Pt's mother is requesting if CY would consider ordering a nasal cannula or something different other than a mask. DME-[facility] for cpap machine  Routing to CY for review  Current Outpatient Medications on File Prior to Visit  Medication Sig Dispense Refill  . cloBAZam (ONFI) 10 MG tablet TAKE 2 TABLETS BY MOUTH EVERY MORNING AND 3 TABLETS AT BEDTIME 150 tablet 5  . lamoTRIgine (LAMICTAL) 100 MG tablet TAKE 2 TABLETS BY MOUTH 3 TIMES DAILY 180 tablet 11  . levETIRAcetam (KEPPRA) 1000 MG tablet TAKE 2.5 TABLETS BY MOUTH 2 TIMES DAILY (Patient taking differently: Take 1,000 mg by mouth 2 (two) times daily. TAKE 2 TABLETS BY MOUTH 2 TIMES DAILY) 150 tablet 11  . LYRICA 225 MG capsule TAKE 1 CAPSULE BY MOUTH 3 TIMES DAILY 270 capsule 3  . VIMPAT 50 MG TABS tablet TAKE 1 TABLET BY MOUTH EVERY MORNING AND 2 tablets EVERY EVENING 90 tablet 11   No current facility-administered medications on file prior to visit.    Allergies  Allergen Reactions  . Penicillins Rash    Has patient had a PCN reaction causing immediate rash, facial/tongue/throat swelling, SOB or lightheadedness with hypotension: No Has patient had a PCN reaction causing severe rash involving mucus membranes or skin necrosis: No Has patient had a PCN reaction that required hospitalization No Has patient had a PCN reaction occurring within the last 10 years: No If all of the above answers are "NO", then may proceed with Cephalosporin use.

## 2017-07-20 NOTE — Telephone Encounter (Signed)
Ok order DME- please change CPAP mask to Nasal pillows type to minimize claustrophobia

## 2017-07-20 NOTE — Telephone Encounter (Signed)
Order placed. LM for Steward DroneBrenda to call back.

## 2017-07-20 NOTE — Telephone Encounter (Signed)
Pt mother is calling back. Cb is [phone].

## 2017-07-20 NOTE — Telephone Encounter (Signed)
Spoke with Steward DroneBrenda and advise her that order was sent to [facility] for nasal pillows. Nothing further Is needed.

## 2017-07-20 NOTE — Telephone Encounter (Signed)
Pt's mother is calling back [phone]

## 2017-07-20 NOTE — Telephone Encounter (Signed)
lmtcb x2 for pt's mother, Steward DroneBrenda.

## 2017-07-20 NOTE — Telephone Encounter (Signed)
Pt mother calling back. Cb is [phone] or [phone] (Work)

## 2017-07-20 NOTE — Telephone Encounter (Signed)
lmtcb x1 for pt's mother, Steward DroneBrenda

## 2017-08-06 DIAGNOSIS — G4733 Obstructive sleep apnea (adult) (pediatric): Secondary | ICD-10-CM | POA: Diagnosis not present

## 2017-08-07 ENCOUNTER — Ambulatory Visit: Payer: PPO | Admitting: Internal Medicine

## 2017-08-07 ENCOUNTER — Other Ambulatory Visit: Payer: Self-pay | Admitting: Neurology

## 2017-08-07 DIAGNOSIS — G40211 Localization-related (focal) (partial) symptomatic epilepsy and epileptic syndromes with complex partial seizures, intractable, with status epilepticus: Secondary | ICD-10-CM

## 2017-08-08 DIAGNOSIS — G40119 Localization-related (focal) (partial) symptomatic epilepsy and epileptic syndromes with simple partial seizures, intractable, without status epilepticus: Secondary | ICD-10-CM | POA: Diagnosis not present

## 2017-08-09 ENCOUNTER — Telehealth: Payer: Self-pay | Admitting: Internal Medicine

## 2017-08-09 DIAGNOSIS — G4733 Obstructive sleep apnea (adult) (pediatric): Secondary | ICD-10-CM

## 2017-08-09 NOTE — Telephone Encounter (Signed)
Looked in patient's chart. Do not see where anyone from this office called patient.   Will call patient's mother back tomorrow as requested to get more information.

## 2017-08-10 NOTE — Telephone Encounter (Signed)
Called and spoke to Merkel, pt's mother (on [place]). She states the pt is requesting to return his CPAP because of his claustrophobia and anxiety. Pt's mother is requesting a d/c order for pt's CPAP, pt uses [facility].   Dr. Maple Hudson please advise on order. Thanks.

## 2017-08-10 NOTE — Telephone Encounter (Signed)
Spoke with patients mother-aware that order has been placed to stop CPAP.

## 2017-08-10 NOTE — Telephone Encounter (Signed)
Ok to order D/C CPAP

## 2017-08-15 DIAGNOSIS — G40211 Localization-related (focal) (partial) symptomatic epilepsy and epileptic syndromes with complex partial seizures, intractable, with status epilepticus: Secondary | ICD-10-CM | POA: Diagnosis not present

## 2017-08-18 DIAGNOSIS — G40119 Localization-related (focal) (partial) symptomatic epilepsy and epileptic syndromes with simple partial seizures, intractable, without status epilepticus: Secondary | ICD-10-CM | POA: Diagnosis not present

## 2017-08-30 DIAGNOSIS — Z01818 Encounter for other preprocedural examination: Secondary | ICD-10-CM | POA: Diagnosis not present

## 2017-08-30 DIAGNOSIS — R569 Unspecified convulsions: Secondary | ICD-10-CM | POA: Diagnosis not present

## 2017-08-30 DIAGNOSIS — E669 Obesity, unspecified: Secondary | ICD-10-CM | POA: Diagnosis not present

## 2017-08-30 DIAGNOSIS — G40119 Localization-related (focal) (partial) symptomatic epilepsy and epileptic syndromes with simple partial seizures, intractable, without status epilepticus: Secondary | ICD-10-CM | POA: Diagnosis not present

## 2017-08-30 DIAGNOSIS — Z9889 Other specified postprocedural states: Secondary | ICD-10-CM | POA: Diagnosis not present

## 2017-08-30 DIAGNOSIS — R000 Tachycardia, unspecified: Secondary | ICD-10-CM | POA: Diagnosis not present

## 2017-08-30 DIAGNOSIS — G40219 Localization-related (focal) (partial) symptomatic epilepsy and epileptic syndromes with complex partial seizures, intractable, without status epilepticus: Secondary | ICD-10-CM | POA: Diagnosis not present

## 2017-08-30 DIAGNOSIS — G4730 Sleep apnea, unspecified: Secondary | ICD-10-CM | POA: Diagnosis not present

## 2017-08-30 DIAGNOSIS — Z880 Allergy status to penicillin: Secondary | ICD-10-CM | POA: Diagnosis not present

## 2017-08-30 DIAGNOSIS — Z6841 Body Mass Index (BMI) 40.0 and over, adult: Secondary | ICD-10-CM | POA: Diagnosis not present

## 2017-08-30 DIAGNOSIS — Z79899 Other long term (current) drug therapy: Secondary | ICD-10-CM | POA: Diagnosis not present

## 2017-09-06 ENCOUNTER — Telehealth: Payer: Self-pay | Admitting: Neurology

## 2017-09-06 MED ORDER — ONDANSETRON HCL 4 MG/2ML IJ SOLN
4.00 | INTRAMUSCULAR | Status: DC
Start: ? — End: 2017-09-06

## 2017-09-06 MED ORDER — LIDOCAINE HCL 1 % IJ SOLN
.50 | INTRAMUSCULAR | Status: DC
Start: ? — End: 2017-09-06

## 2017-09-06 MED ORDER — ACETAMINOPHEN 325 MG PO TABS
650.00 | ORAL_TABLET | ORAL | Status: DC
Start: ? — End: 2017-09-06

## 2017-09-06 MED ORDER — LAMOTRIGINE 200 MG PO TABS
200.00 | ORAL_TABLET | ORAL | Status: DC
Start: 2017-09-06 — End: 2017-09-06

## 2017-09-06 MED ORDER — PANTOPRAZOLE SODIUM 40 MG PO TBEC
40.00 | DELAYED_RELEASE_TABLET | ORAL | Status: DC
Start: 2017-09-07 — End: 2017-09-06

## 2017-09-06 MED ORDER — GENERIC EXTERNAL MEDICATION
Status: DC
Start: ? — End: 2017-09-06

## 2017-09-06 MED ORDER — GENERIC EXTERNAL MEDICATION
225.00 | Status: DC
Start: 2017-09-06 — End: 2017-09-06

## 2017-09-06 MED ORDER — SENNOSIDES-DOCUSATE SODIUM 8.6-50 MG PO TABS
2.00 | ORAL_TABLET | ORAL | Status: DC
Start: ? — End: 2017-09-06

## 2017-09-06 MED ORDER — POLYETHYLENE GLYCOL 3350 17 G PO PACK
17.00 | PACK | ORAL | Status: DC
Start: 2017-09-07 — End: 2017-09-06

## 2017-09-06 MED ORDER — GENERIC EXTERNAL MEDICATION
2000.00 | Status: DC
Start: 2017-09-06 — End: 2017-09-06

## 2017-09-06 MED ORDER — SENNOSIDES-DOCUSATE SODIUM 8.6-50 MG PO TABS
2.00 | ORAL_TABLET | ORAL | Status: DC
Start: 2017-09-06 — End: 2017-09-06

## 2017-09-06 NOTE — Telephone Encounter (Signed)
Discharged from [facility] today for the Internal EEG. He is needing staples removed on Monday and she needed to know if she could come to our office to have them removed verses [facility] driving [facility]? Please Call. Thanks

## 2017-09-07 NOTE — Telephone Encounter (Signed)
LMOM for pt's mother explaining that we do not have the proper equipment in our office to remove staples.

## 2017-09-11 ENCOUNTER — Other Ambulatory Visit: Payer: Self-pay | Admitting: Neurology

## 2017-09-11 ENCOUNTER — Ambulatory Visit: Payer: PPO | Admitting: Family Medicine

## 2017-09-11 ENCOUNTER — Other Ambulatory Visit: Payer: Self-pay

## 2017-09-11 DIAGNOSIS — G40211 Localization-related (focal) (partial) symptomatic epilepsy and epileptic syndromes with complex partial seizures, intractable, with status epilepticus: Secondary | ICD-10-CM

## 2017-09-11 NOTE — Patient Outreach (Signed)
[facility] Care Management  09/11/2017  Gregg FieldRussell A Young 18-Oct-1975 [id number]    Referral received. No outreach warranted at this time. Transition of Care  will be completed by primary care provider office who will [facility] if needed.  Plan: RN CM will close case.  Bary Lericheionne J Rakeisha Nyce, RN, MSN [facility] Care [facility] Care Management Coordinator Direct Line [phone] Toll Free: [phone]  Fax: [phone]

## 2017-09-14 ENCOUNTER — Ambulatory Visit: Payer: PPO | Admitting: Family Medicine

## 2017-09-14 ENCOUNTER — Ambulatory Visit (INDEPENDENT_AMBULATORY_CARE_PROVIDER_SITE_OTHER): Payer: PPO | Admitting: Family Medicine

## 2017-09-14 ENCOUNTER — Encounter: Payer: Self-pay | Admitting: Family Medicine

## 2017-09-14 DIAGNOSIS — G40219 Localization-related (focal) (partial) symptomatic epilepsy and epileptic syndromes with complex partial seizures, intractable, without status epilepticus: Secondary | ICD-10-CM | POA: Diagnosis not present

## 2017-09-14 NOTE — Progress Notes (Signed)
Subjective:  Gregg Young is a 42 y.o. year old very pleasant male patient who presents for/with See problem oriented charting ROS- daily seizure like activity. No chest pain or shortness of pain reported. Some scalp discomfort from not being able to wash hair recently   Past Medical History-  Patient Active Problem List   Diagnosis Date Noted  . 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 Obstructive sleep apnea 05/08/2017    Priority: Medium  . Dizziness and giddiness 07/08/2014  . Localization-related symptomatic epilepsy and epileptic syndromes with complex partial seizures, intractable, with status epilepticus (HCC) 01/15/2014    Medications- reviewed and updated Current Outpatient Medications  Medication Sig Dispense Refill  . lamoTRIgine (LAMICTAL) 100 MG tablet TAKE 2 TABLETS BY MOUTH 3 TIMES DAILY 180 tablet 11  . levETIRAcetam (KEPPRA) 1000 MG tablet TAKE 2.5 TABLETS BY MOUTH 2 TIMES DAILY (Patient taking differently: Take 1,000 mg by mouth 2 (two) times daily. TAKE 2 TABLETS BY MOUTH 2 TIMES DAILY) 150 tablet 11  . LYRICA 225 MG capsule TAKE 1 CAPSULE BY MOUTH 3 TIMES DAILY 270 capsule 3  . ONFI 10 MG tablet TAKE 2 TABLETS BY MOUTH EVERY MORNING AND 3 tablets AT BEDTIME 150 tablet 5  . VIMPAT 50 MG TABS tablet TAKE 1 TABLET BY MOUTH EVERY MORNING AND TAKE 2 TABLETS BY MOUTH NIGHTLY AT BEDTIME 90 tablet 5   No current facility-administered medications for this visit.     Objective: BP 128/82 (BP Location: Left Arm, Patient Position: Sitting, Cuff Size: Large)   Pulse 94   Temp 98.3 F (36.8 C) (Oral)   Ht 5\' 8"  (1.727 m)   Wt 259 lb (117.5 kg)   SpO2 96%   BMI 39.38 kg/m  Gen: NAD, resting comfortably CV: RRR no murmurs rubs or gallops Lungs: CTAB no crackles, wheeze, rhonchi Abdomen: soft/nontender/nondistended/normal bowel sounds. 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 Presents today for 14 staple removal - these were placed after [facility]. We successfully removed those today.   Patient has seizures daily and had about a 15 second seizure while in the office. We were able to lay him down and safely manage this.   [facility] - he has no further questions and has follow up on July 9th for results- I am certainly hopeful they are able to help him decrease his frequency.   Patient and family have been disappointed that vagal nerve stimulator was not more effective.  A/P: continue current medicines as well as [facility] and local neurology follow up- hopeful some progress can be made to reduce his seizure frequency. He has very supportive parents who are aging so would be great if he could have some independence gained over next few years.    Future Appointments  Date Time Provider Department Center  09/21/2017  3:30 PM Waymon BudgeYoung, Clinton D, MD [facility] None  01/01/2018  4:00 PM Van ClinesAquino, Karen M, MD [facility] None   Return precautions advised.  Tana ConchStephen Faris Coolman, MD

## 2017-09-14 NOTE — Assessment & Plan Note (Signed)
S: Patient with difficult to control epilepsy being monitored by local neurology as well as [facility] neurology. Presents today for 14 staple removal - these were placed after removal [facility] depth [facility]. We successfully removed those today.   Patient has seizures daily and had about a 15 second seizure while in the office. We were able to lay him down and safely manage this.   [facility] - he has no further questions and has follow up on July 9th for results- I am certainly hopeful they are able to help him decrease his frequency.   Patient and family have been disappointed that vagal nerve stimulator was not more effective.  A/P: continue current medicines as well as [facility] and local [facility]y follow up- hopeful some progress can be made to reduce his seizure frequency. He has very supportive parents who are aging so would be great if he could have some independence gained over next few years.

## 2017-09-21 ENCOUNTER — Ambulatory Visit: Payer: PPO | Admitting: Internal Medicine

## 2017-10-04 ENCOUNTER — Telehealth: Payer: Self-pay | Admitting: Neurology

## 2017-10-04 DIAGNOSIS — G40219 Localization-related (focal) (partial) symptomatic epilepsy and epileptic syndromes with complex partial seizures, intractable, without status epilepticus: Secondary | ICD-10-CM | POA: Diagnosis not present

## 2017-10-04 NOTE — Telephone Encounter (Signed)
Left VM for mother to call back with best # and time to reach her.

## 2017-10-04 NOTE — Telephone Encounter (Signed)
Called again, left VM. Will call back on Friday

## 2017-10-06 NOTE — Telephone Encounter (Signed)
Spoke to mother and patient. Gregg Young had been refusing MRI that day, but his neurosurgeon called and spoke to him and he proceeded with MRI. Plan for consultation then DBS implantation this month. He is feeling good, last seizure yesterday, no recent falls. All their questions and concerns were answered.

## 2017-10-10 DIAGNOSIS — G40219 Localization-related (focal) (partial) symptomatic epilepsy and epileptic syndromes with complex partial seizures, intractable, without status epilepticus: Secondary | ICD-10-CM | POA: Diagnosis not present

## 2017-10-11 ENCOUNTER — Telehealth: Payer: Self-pay | Admitting: Neurology

## 2017-10-11 NOTE — Telephone Encounter (Signed)
Josefa HalfBrenda Budge Mother [phone]  Steward DroneBrenda called and would like for Dr Karel JarvisAquino to call her back she would like to discuss Russ[facility].

## 2017-10-12 NOTE — Telephone Encounter (Signed)
Spoke to mother who reports that DBS has been denied by his insurance because it is only approved for Parkinsons disease. Discussed with her that it has only been recently approved by FDA for epilepsy, maybe his insurance needs this [facility]. Will [facility].

## 2017-10-18 ENCOUNTER — Telehealth: Payer: Self-pay | Admitting: Neurology

## 2017-10-18 NOTE — Telephone Encounter (Signed)
Patient's mother called and said that Gregg Young's Surgery has been approved. Thanks

## 2017-10-18 NOTE — Telephone Encounter (Signed)
FYI

## 2017-10-20 DIAGNOSIS — Z87891 Personal history of nicotine dependence: Secondary | ICD-10-CM | POA: Diagnosis not present

## 2017-10-20 DIAGNOSIS — G4733 Obstructive sleep apnea (adult) (pediatric): Secondary | ICD-10-CM | POA: Diagnosis not present

## 2017-10-20 DIAGNOSIS — G40219 Localization-related (focal) (partial) symptomatic epilepsy and epileptic syndromes with complex partial seizures, intractable, without status epilepticus: Secondary | ICD-10-CM | POA: Diagnosis not present

## 2017-10-20 DIAGNOSIS — Z880 Allergy status to penicillin: Secondary | ICD-10-CM | POA: Diagnosis not present

## 2017-10-20 DIAGNOSIS — Z6841 Body Mass Index (BMI) 40.0 and over, adult: Secondary | ICD-10-CM | POA: Diagnosis not present

## 2017-10-20 DIAGNOSIS — E669 Obesity, unspecified: Secondary | ICD-10-CM | POA: Diagnosis not present

## 2017-10-20 DIAGNOSIS — Z79899 Other long term (current) drug therapy: Secondary | ICD-10-CM | POA: Diagnosis not present

## 2017-10-21 MED ORDER — CLOBAZAM 10 MG PO TABS
20.00 | ORAL_TABLET | ORAL | Status: DC
Start: 2017-10-22 — End: 2017-10-21

## 2017-10-21 MED ORDER — LACOSAMIDE 50 MG PO TABS
100.00 | ORAL_TABLET | ORAL | Status: DC
Start: 2017-10-21 — End: 2017-10-21

## 2017-10-21 MED ORDER — ONDANSETRON HCL 4 MG/2ML IJ SOLN
4.00 | INTRAMUSCULAR | Status: DC
Start: ? — End: 2017-10-21

## 2017-10-21 MED ORDER — SENNOSIDES-DOCUSATE SODIUM 8.6-50 MG PO TABS
2.00 | ORAL_TABLET | ORAL | Status: DC
Start: ? — End: 2017-10-21

## 2017-10-21 MED ORDER — GENERIC EXTERNAL MEDICATION
225.00 | Status: DC
Start: 2017-10-21 — End: 2017-10-21

## 2017-10-21 MED ORDER — LAMOTRIGINE 200 MG PO TABS
200.00 | ORAL_TABLET | ORAL | Status: DC
Start: 2017-10-21 — End: 2017-10-21

## 2017-10-21 MED ORDER — CLOBAZAM 10 MG PO TABS
60.00 | ORAL_TABLET | ORAL | Status: DC
Start: 2017-10-21 — End: 2017-10-21

## 2017-10-21 MED ORDER — LACOSAMIDE 50 MG PO TABS
50.00 | ORAL_TABLET | ORAL | Status: DC
Start: 2017-10-22 — End: 2017-10-21

## 2017-10-21 MED ORDER — LEVETIRACETAM 500 MG PO TABS
2000.00 | ORAL_TABLET | ORAL | Status: DC
Start: 2017-10-21 — End: 2017-10-21

## 2017-10-21 MED ORDER — ACETAMINOPHEN 325 MG PO TABS
975.00 | ORAL_TABLET | ORAL | Status: DC
Start: 2017-10-21 — End: 2017-10-21

## 2017-10-21 MED ORDER — POLYETHYLENE GLYCOL 3350 17 G PO PACK
17.00 | PACK | ORAL | Status: DC
Start: ? — End: 2017-10-21

## 2017-10-21 MED ORDER — OXYCODONE HCL 5 MG PO TABS
5.00 | ORAL_TABLET | ORAL | Status: DC
Start: ? — End: 2017-10-21

## 2017-11-03 DIAGNOSIS — Z79899 Other long term (current) drug therapy: Secondary | ICD-10-CM | POA: Diagnosis not present

## 2017-11-03 DIAGNOSIS — E668 Other obesity: Secondary | ICD-10-CM | POA: Diagnosis not present

## 2017-11-03 DIAGNOSIS — G40219 Localization-related (focal) (partial) symptomatic epilepsy and epileptic syndromes with complex partial seizures, intractable, without status epilepticus: Secondary | ICD-10-CM | POA: Diagnosis not present

## 2017-11-03 DIAGNOSIS — G40211 Localization-related (focal) (partial) symptomatic epilepsy and epileptic syndromes with complex partial seizures, intractable, with status epilepticus: Secondary | ICD-10-CM | POA: Diagnosis not present

## 2017-11-03 DIAGNOSIS — Z87891 Personal history of nicotine dependence: Secondary | ICD-10-CM | POA: Diagnosis not present

## 2017-11-03 DIAGNOSIS — G4733 Obstructive sleep apnea (adult) (pediatric): Secondary | ICD-10-CM | POA: Diagnosis not present

## 2017-11-14 DIAGNOSIS — G40219 Localization-related (focal) (partial) symptomatic epilepsy and epileptic syndromes with complex partial seizures, intractable, without status epilepticus: Secondary | ICD-10-CM | POA: Diagnosis not present

## 2017-11-21 DIAGNOSIS — G40119 Localization-related (focal) (partial) symptomatic epilepsy and epileptic syndromes with simple partial seizures, intractable, without status epilepticus: Secondary | ICD-10-CM | POA: Diagnosis not present

## 2017-12-05 ENCOUNTER — Other Ambulatory Visit: Payer: Self-pay | Admitting: Neurology

## 2017-12-12 ENCOUNTER — Other Ambulatory Visit: Payer: Self-pay | Admitting: Neurology

## 2017-12-12 DIAGNOSIS — G40211 Localization-related (focal) (partial) symptomatic epilepsy and epileptic syndromes with complex partial seizures, intractable, with status epilepticus: Secondary | ICD-10-CM

## 2017-12-28 DIAGNOSIS — G40119 Localization-related (focal) (partial) symptomatic epilepsy and epileptic syndromes with simple partial seizures, intractable, without status epilepticus: Secondary | ICD-10-CM | POA: Diagnosis not present

## 2018-01-01 ENCOUNTER — Telehealth: Payer: Self-pay

## 2018-01-01 ENCOUNTER — Other Ambulatory Visit: Payer: Self-pay

## 2018-01-01 ENCOUNTER — Encounter: Payer: Self-pay | Admitting: Neurology

## 2018-01-01 ENCOUNTER — Ambulatory Visit (INDEPENDENT_AMBULATORY_CARE_PROVIDER_SITE_OTHER): Payer: PPO | Admitting: Neurology

## 2018-01-01 VITALS — BP 124/86 | HR 86 | Ht 67.0 in | Wt 264.0 lb

## 2018-01-01 DIAGNOSIS — G4733 Obstructive sleep apnea (adult) (pediatric): Secondary | ICD-10-CM

## 2018-01-01 DIAGNOSIS — G40211 Localization-related (focal) (partial) symptomatic epilepsy and epileptic syndromes with complex partial seizures, intractable, with status epilepticus: Secondary | ICD-10-CM

## 2018-01-01 MED ORDER — LACOSAMIDE 50 MG PO TABS: ORAL_TABLET | ORAL | 5 refills | Status: DC

## 2018-01-01 MED ORDER — PREGABALIN 225 MG PO CAPS: 225.0000 mg | ORAL_CAPSULE | Freq: Three times a day (TID) | ORAL | 3 refills | Status: DC

## 2018-01-01 MED ORDER — LEVETIRACETAM 1000 MG PO TABS: 1000.0000 mg | ORAL_TABLET | Freq: Two times a day (BID) | ORAL | 3 refills | Status: DC

## 2018-01-01 MED ORDER — LAMOTRIGINE 100 MG PO TABS: ORAL_TABLET | ORAL | 11 refills | Status: DC

## 2018-01-01 MED ORDER — CLOBAZAM 10 MG PO TABS: ORAL_TABLET | ORAL | 5 refills | Status: DC

## 2018-01-01 NOTE — Patient Instructions (Signed)
Great seeing you! Continue all your medications. Continue follow-up at [facility]. Discuss CPAP issues with your sleep specialist. Follow-up in 6 months, call for any changes.  Seizure Precautions: 1. If medication has been prescribed for you to prevent seizures, take it exactly as directed.  Do not stop taking the medicine without talking to your doctor first, even if you have not had a seizure in a long time.   2. Avoid activities in which a seizure would cause danger to yourself or to others.  Don't operate dangerous machinery, swim alone, or climb in high or dangerous places, such as on ladders, roofs, or girders.  Do not drive unless your doctor says you may.  3. If you have any warning that you may have a seizure, lay down in a safe place where you can't hurt yourself.    4.  No driving for 6 months from last seizure, as per [facility].   Please refer to the following link on the Epilepsy Foundation of America's website for more information: http://www.epilepsyfoundation.org/answerplace/Social/driving/drivingu.cfm   5.  Maintain good sleep hygiene. Avoid alcohol.  6.  Contact your doctor if you have any problems that may be related to the medicine you are taking.  7.  Call 911 and bring the patient back to the ED if:        A.  The seizure lasts longer than 5 minutes.       B.  The patient doesn't awaken shortly after the seizure  C.  The patient has new problems such as difficulty seeing, speaking or moving  D.  The patient was injured during the seizure  E.  The patient has a temperature over 102 F (39C)  F.  The patient vomited and now is having trouble breathing

## 2018-01-01 NOTE — Telephone Encounter (Signed)
Rcvd call from [facility] at [facility] clarifying Keppra directions

## 2018-01-01 NOTE — Progress Notes (Signed)
NEUROLOGY FOLLOW UP OFFICE NOTE  Gregg Young [id number]  DOB: 11/20/1975  HISTORY OF PRESENT ILLNESS: I had the pleasure of seeing Gregg Young in follow-up in the neurology clinic on 01/01/2018.  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 He is again accompanied by his father who helps supplement the history today. Records were reviewed since his last visit. He has had an extensive evaluation at [facility] with repeat MRI brain showing no clear abnormality, PET scan with questionable decreased uptake on the left, Neuropsych testing suggesting more right-sided dysfunction. He underwent sEEG which was non-lateralizing, broad [place] t seizure onset with low voltage fast activity from left and right, insular origin also suspicious. He had DBS placed last 10/2017 for non-resective stimulation treatment. Most recent DBS adjustment was last week. His father reports that after having daily seizures for a long time, family did not witness any seizures for a few days after surgery, then seizures came back but his mother feels they are much less. His father thinks he had one today. He had 1 fall this month, no injuries. He denies any headaches, dizziness, sleep difficulties, focal numbness/tingling/weakness. He is in good spirits today. He reports sleep is good. He has had a sleep study showing severe sleep apnea but cannot tolerate CPAP. He continues on Keppra 2000mg  BID, Onfi 20mg  in AM, 30mg  in PM, Vimpat 50mg  in AM, 100mg  in PM, Lamictal 200mg  TID, Lyrica 225mg  TID. Previous attempts at taper by his prior neurologists have led to status epilepticus.   HPI: This is a 42 yo RH man with a history of seizures since age 20. He has no recollection of events, no prior warning, witnessed by his mother to have a generalized convulsion lasting 90 to 120 seconds. He was brought to [facility] then had another convulsion 2 days later. They recall trying different medications, Depakote caused  liver dysfunction, he has failed Dilantin and Zonegran. He has been on Keppra, Lamictal, Lyrica, and most recently Vimpat. He had an EMU admission at [facility], records unavailable for review, per notes he stayed for 36 hours and had generalized seizures, multiple foci. They report two admissions for status epilepticus, one in September 2002 in the setting of weaning off Depakote, and another in January 2003. Records unavailable for review. His last GTC was around 5 years ago. He continued to have "petit mals" several times a day where his eyes would roll back, hands would shake for 30-45 seconds, if standing he would fall and had required sutures and staples in the past. He had been having the "petit mals" several times daily until 3 weeks ago when he started having a different type of episode and the petit mals "completely stopped." He was brought to the ER on 12/07/13 when his parents awoke to him dry having and speaking gibberish. He would be unable to speak, control his limbs, and cannot stand up without assistance. He can hear people around him but his jaw feels tight. The episodes can last for several hours. He has had 4 episodes in the past 3 weeks. The patient reports that they have been occurring only during the weekends, however his parents remind him he had one on Wednesday. He went to his neurologist's office on 09/10 where he had an episode while walking to the exam room where he became dizzy and fell. His father was able to catch him, and they reported his speech became affected. He was noted to be speaking in a  whisper throughout the visit. He does note that he becomes more dizzy after taking his medications. Lamictal level was 17.8, Vimpat level 7.3.   The patient lives with his parents and brother. He is on disability and mostly stays at home playing video games. He graduated high school, no special ed classes. He endorses olfactory hallucinations but cannot describe it except saying  they are the same all the time.   Epilepsy Risk Factors: He had a skull fracture on the right side after a fall at 16 months of age. Otherwise he had a normal birth and early development. There is no history of febrile convulsions, CNS infections such as meningitis/encephalitis, neurosurgical procedures, or family history of seizures.  Prior AEDs: Depakote, Dilantin, Zonegran  EEGs: 48-hour EEG (03/17/14 to 03/19/14) abnormal with multifocal discharges and right temporal slowing. Prior records note "generalized seizures, multiple foci" He had a 72-hour EEG (09/26/16 to 09/29/16) which was abnormal with bursts and runs of focal slowing over the right temporal region, bursts and runs of diffuse rhythmic slowing with intermixed generalized spikes lasting up to 120 seconds without clinical correlate, multifocal epileptiform discharges seen over the right temporal, left frontal regions, as well as generalized spikes and polyspikes. During sleep, there were bursts of generalized fast activity. There were 2 clinicoelectrographic seizures captured with right head turn and some hypermotor activity that were non-lateralizing on EEG with diffuse background suppression at seizure onset.  MRI: I personally reviewed MRI brain with and without contrast done 12/26/2013 which did not show any acute intracranial abnormality, hippocampi symmetric without abnormal signal or enhancement.  PAST MEDICAL HISTORY: Past Medical History:  Diagnosis Date  . History of kidney stones   . Seizures (HCC)    daily    MEDICATIONS:  Outpatient Encounter Medications as of 01/01/2018  Medication Sig  . cloBAZam (ONFI) 10 MG tablet TAKE 2 TABLETS BY MOUTH EVERY MORNING AND TAKE 3 TABLETS AT BEDTIME  . lamoTRIgine (LAMICTAL) 100 MG tablet TAKE 2 TABLETS BY MOUTH 3 TIMES DAILY  . levETIRAcetam (KEPPRA) 1000 MG tablet TAKE 2.5 TABLETS BY MOUTH 2 TIMES DAILY (Patient taking differently: Take 1,000 mg by mouth 2 (two) times daily. TAKE  2 TABLETS BY MOUTH 2 TIMES DAILY)  . LYRICA 225 MG capsule TAKE 1 CAPSULE BY MOUTH 3 TIMES DAILY  . senna-docusate (SENOKOT-S) 8.6-50 MG tablet Take by mouth.  Marland Kitchen VIMPAT 50 MG TABS tablet TAKE 1 TABLET BY MOUTH EVERY MORNING AND TAKE 2 TABLETS BY MOUTH NIGHTLY AT BEDTIME   No facility-administered encounter medications on file as of 01/01/2018.      ALLERGIES: Allergies  Allergen Reactions  . Penicillins Rash    Has patient had a PCN reaction causing immediate rash, facial/tongue/throat swelling, SOB or lightheadedness with hypotension: No Has patient had a PCN reaction causing severe rash involving mucus membranes or skin necrosis: No Has patient had a PCN reaction that required hospitalization No Has patient had a PCN reaction occurring within the last 10 years: No If all of the above answers are "NO", then may proceed with Cephalosporin use.     FAMILY HISTORY: Family History  Problem Relation Age of Onset  . Healthy Mother   . Healthy Father     SOCIAL HISTORY: Social History   Socioeconomic History  . Marital status: Single    Spouse name: Not on file  . Number of children: 0  . Years of education: HS  . Highest education level: Not on file  Occupational History  .  Not on file  Social Needs  . Financial resource strain: Not on file  . Food insecurity:    Worry: Not on file    Inability: Not on file  . Transportation needs:    Medical: Not on file    Non-medical: Not on file  Tobacco Use  . Smoking status: Former Smoker    Packs/day: 0.50    Years: 4.00    Pack years: 2.00    Types: Cigarettes    Last attempt to quit: 03/02/1985    Years since quitting: 32.8  . Smokeless tobacco: Never Used  Substance and Sexual Activity  . Alcohol use: No    Alcohol/week: 0.0 standard drinks  . Drug use: No  . Sexual activity: Not on file  Lifestyle  . Physical activity:    Days per week: Not on file    Minutes per session: Not on file  . Stress: Not on file    Relationships  . Social connections:    Talks on phone: Not on file    Gets together: Not on file    Attends religious service: Not on file    Active member of club or organization: Not on file    Attends meetings of clubs or organizations: Not on file    Relationship status: Not on file  . Intimate partner violence:    Fear of current or ex partner: Not on file    Emotionally abused: Not on file    Physically abused: Not on file    Forced sexual activity: Not on file  Other Topics Concern  . Not on file  Social History Narrative   Patient is single and lives with his parents.   Patient has a high school education.   Patient is right-handed.   Patient does not have any children.   Patient is on disability.   Patient drinks three sodas daily.    REVIEW OF SYSTEMS: Constitutional: No fevers, chills, or sweats, no generalized fatigue, change in appetite Eyes: No visual changes, double vision, eye pain Ear, nose and throat: No hearing loss, ear pain, nasal congestion, sore throat Cardiovascular: No chest pain, palpitations Respiratory:  No shortness of breath at rest or with exertion, wheezes GastrointestinaI: No nausea, vomiting, diarrhea, abdominal pain, fecal incontinence Genitourinary:  No dysuria, urinary retention or frequency Musculoskeletal:  No neck pain, back pain Integumentary: No rash, pruritus, skin lesions Neurological: as above Psychiatric: No depression, insomnia, anxiety Endocrine: No palpitations, fatigue, diaphoresis, mood swings, change in appetite, change in weight, increased thirst Hematologic/Lymphatic:  No anemia, purpura, petechiae. Allergic/Immunologic: no itchy/runny eyes, nasal congestion, recent allergic reactions, rashes  PHYSICAL EXAM: Vitals:   01/01/18 1458  BP: 124/86  Pulse: 86  SpO2: 94%   General: No acute distress Head:  Normocephalic/atraumatic Neck: supple, no paraspinal tenderness, full range of motion Heart:  Regular rate and  rhythm Lungs:  Clear to auscultation bilaterally Back: No paraspinal tenderness Skin/Extremities: No rash, no edema Neurological Exam: alert and oriented to person, place, and time. No aphasia or dysarthria. Fund of knowledge is appropriate.  Recent and remote memory are intact. Attention and concentration are normal.    Able to name objects and repeat phrases. Cranial nerves: Pupils equal, round, reactive to light. Extraocular movements intact with no nystagmus. Visual fields full. Facial sensation intact. No facial asymmetry. Tongue, uvula, palate midline.  Motor: Bulk and tone normal, muscle strength 5/5 throughout with no pronator drift.  Sensation to light touch intact.  No extinction to double  simultaneous stimulation.  Deep tendon reflexes 2+ throughout, toes downgoing.  Finger to nose testing intact.  Gait narrow-based and steady, able to tandem walk adequately.  Romberg negative.  VNS Therapy Management: Parameters Output Current (mA): 2 Signal Frequency (Hz): 20 Pulse Width (usec): 250 Signal ON Time (sec): 30 Signal OFF Time (min): 5 Magnet Output Current (mA): 2.25 Magnet ON Time (sec): 60 Magnet Pulse Width (usec): 500 AutoStim Output Current (mA): 2.25 AutoStim Pulse Width (usec): 250 AutoStim ON Time (sec): 60 Tachycardia Detection : On Heartbeat Detection Sensitivity: 3 Perform Verify Heartbeat Detection: yes Threshold for AutoStim (%): 20 Diagnostics Output Current: ok Current Delievered (mA): 2 Lead Impedance: OK Impedence Value (Ohms): 2280 Battery Status Indicator (color): Green  IMPRESSION: This is a 42 yo RH man with a history multifocal epilepsy. Family reports seizures started at age 47. He reported continued multiple daily episodes of "petit mals" despite high doses of AEDs, currently on 5 AEDs. He underwent VNS placement last 03/10/15, but continued to have daily seizures. He underwent [facility]EE[facility] at [facility] with non-lateralizing EEG, DBS placed in 10/2017. There seems to  be a reduction in seizures, continue to monitor. He and his family have been interested in tapering medications, we discussed getting an idea of baseline seizure frequency first once DBS is at optimal settings, then potentially starting with taper of Keppra. Refills for his medications were sent today. VNS interrogated, no changes made. He has severe apnea with difficulty tolerating CPAP, advised to speak to Dr. Maple Hudson about potential options. He will follow-up in 6 months after [facility] visits and knows to call our office for any problems.   Thank you for allowing me to participate in his care.  Please do not hesitate to call for any questions or concerns.  The duration of this appointment visit was 25 minutes of face-to-face time with the patient.  Greater than 50% of this time was spent in counseling, explanation of diagnosis, planning of further management, and coordination of care.   Patrcia Dolly, M.D.   CC: Dr. Durene Cal, Dr. Maple Hudson

## 2018-01-23 DIAGNOSIS — G40209 Localization-related (focal) (partial) symptomatic epilepsy and epileptic syndromes with complex partial seizures, not intractable, without status epilepticus: Secondary | ICD-10-CM | POA: Diagnosis not present

## 2018-01-31 ENCOUNTER — Ambulatory Visit (INDEPENDENT_AMBULATORY_CARE_PROVIDER_SITE_OTHER): Payer: PPO | Admitting: Surgical

## 2018-01-31 ENCOUNTER — Encounter: Payer: Self-pay | Admitting: Family Medicine

## 2018-01-31 ENCOUNTER — Ambulatory Visit: Payer: PPO

## 2018-01-31 DIAGNOSIS — Z23 Encounter for immunization: Secondary | ICD-10-CM

## 2018-02-05 ENCOUNTER — Telehealth: Payer: Self-pay | Admitting: *Deleted

## 2018-02-05 NOTE — Telephone Encounter (Signed)
Copied from CRM [id number]. Topic: General - Other >> Feb 05, 2018  4:46 PM Marylen Ponto wrote: Reason for CRM: Pt mother Steward Drone states pt will be having a dental procedure and needs an antibiotic.

## 2018-02-05 NOTE — Telephone Encounter (Signed)
What is the indication for antbiotics? Also just wondering who told them that he needed antibiotics- which specialist that is.

## 2018-02-06 NOTE — Telephone Encounter (Signed)
Great- would ask Dr. Sunday Corn of neurosurgery to recommend which antibiotics to prescribe. I am fine sending in amoxicillin 2000mg  for one dose if that's what he recommends (team you can send this in). Alternatively he can prescribe the medication since he is recommending it.

## 2018-02-06 NOTE — Telephone Encounter (Signed)
Called and spoke with patients mother. She states that patient had a deep brain stimulator placed and it was recommended by Dr Sunday Corn (neurosurgery) that he take antibiotics prior to the crown placement he has scheduled on November 20th.

## 2018-02-07 ENCOUNTER — Other Ambulatory Visit: Payer: Self-pay | Admitting: Neurology

## 2018-02-07 NOTE — Telephone Encounter (Signed)
Called Brenda (mom) at [phone] and left VM to call the office.  

## 2018-02-08 NOTE — Telephone Encounter (Signed)
Called Steward Drone (mom) at [phone] and left VM to call the office.

## 2018-02-09 NOTE — Telephone Encounter (Signed)
Patient mother is calling back  CB# [phone]

## 2018-02-12 ENCOUNTER — Telehealth: Payer: Self-pay | Admitting: Neurology

## 2018-02-12 NOTE — Telephone Encounter (Signed)
Called and left a voicemail message asking mom Steward Drone) to confirm if Dr. Sunday Corn would recommend Amoxicillin 2000mg  or what his recommendations would be.

## 2018-02-12 NOTE — Telephone Encounter (Signed)
Please advise 

## 2018-02-12 NOTE — Telephone Encounter (Signed)
Patient's mom is calling in stating that he has upcoming dental work scheduled and had a stimulator put in by [facility] and they told her that he would need to get an antibiotic for the dental work by [employer] for this. He uses  [facility] [employer]. If you need to call her it's [phone]. Thanks!

## 2018-02-13 NOTE — Telephone Encounter (Signed)
See note

## 2018-02-13 NOTE — Telephone Encounter (Signed)
Noted  

## 2018-02-13 NOTE — Telephone Encounter (Signed)
Dr. Karel JarvisAquino would not be the one to prescribe an antibiotic.  I would assume that would be prescribed by the dentist or the surgeon who implanted the stimulator.

## 2018-02-13 NOTE — Telephone Encounter (Signed)
Steward DroneBrenda pt mother is calling back and she is still waiting for dr Sunday Cornsouthwell recommendations

## 2018-02-13 NOTE — Telephone Encounter (Signed)
LMOM letting them know they need to contact the dentist for this prescription and that it would not be written by this office.

## 2018-02-20 ENCOUNTER — Other Ambulatory Visit: Payer: Self-pay | Admitting: Neurology

## 2018-02-20 DIAGNOSIS — G40211 Localization-related (focal) (partial) symptomatic epilepsy and epileptic syndromes with complex partial seizures, intractable, with status epilepticus: Secondary | ICD-10-CM

## 2018-03-06 DIAGNOSIS — G40209 Localization-related (focal) (partial) symptomatic epilepsy and epileptic syndromes with complex partial seizures, not intractable, without status epilepticus: Secondary | ICD-10-CM | POA: Diagnosis not present

## 2018-05-01 DIAGNOSIS — G40119 Localization-related (focal) (partial) symptomatic epilepsy and epileptic syndromes with simple partial seizures, intractable, without status epilepticus: Secondary | ICD-10-CM | POA: Diagnosis not present

## 2018-06-04 ENCOUNTER — Other Ambulatory Visit: Payer: Self-pay | Admitting: Neurology

## 2018-06-04 DIAGNOSIS — G40211 Localization-related (focal) (partial) symptomatic epilepsy and epileptic syndromes with complex partial seizures, intractable, with status epilepticus: Secondary | ICD-10-CM

## 2018-06-05 NOTE — Telephone Encounter (Signed)
Forwarded to Dr. Aquino for approval 

## 2018-07-03 ENCOUNTER — Other Ambulatory Visit: Payer: Self-pay | Admitting: Neurology

## 2018-07-28 ENCOUNTER — Other Ambulatory Visit: Payer: Self-pay | Admitting: Neurology

## 2018-07-28 DIAGNOSIS — G40211 Localization-related (focal) (partial) symptomatic epilepsy and epileptic syndromes with complex partial seizures, intractable, with status epilepticus: Secondary | ICD-10-CM

## 2018-07-30 NOTE — Telephone Encounter (Signed)
lacosaimide 50mg  tablet---narcotic Last seen--01/01/18  Last refill--02/20/18

## 2018-08-07 DIAGNOSIS — G40119 Localization-related (focal) (partial) symptomatic epilepsy and epileptic syndromes with simple partial seizures, intractable, without status epilepticus: Secondary | ICD-10-CM | POA: Diagnosis not present

## 2018-08-08 ENCOUNTER — Telehealth (INDEPENDENT_AMBULATORY_CARE_PROVIDER_SITE_OTHER): Payer: PPO | Admitting: Neurology

## 2018-08-08 ENCOUNTER — Other Ambulatory Visit: Payer: Self-pay

## 2018-08-08 ENCOUNTER — Encounter: Payer: Self-pay | Admitting: Neurology

## 2018-08-08 VITALS — BP 120/80 | Ht 67.0 in | Wt 260.0 lb

## 2018-08-08 DIAGNOSIS — G40211 Localization-related (focal) (partial) symptomatic epilepsy and epileptic syndromes with complex partial seizures, intractable, with status epilepticus: Secondary | ICD-10-CM | POA: Diagnosis not present

## 2018-08-08 MED ORDER — LACOSAMIDE 50 MG PO TABS: ORAL_TABLET | ORAL | 5 refills | Status: DC

## 2018-08-08 MED ORDER — PREGABALIN 225 MG PO CAPS: 225.0000 mg | ORAL_CAPSULE | Freq: Three times a day (TID) | ORAL | 3 refills | Status: DC

## 2018-08-08 MED ORDER — CLOBAZAM 10 MG PO TABS: ORAL_TABLET | ORAL | 5 refills | Status: DC

## 2018-08-08 MED ORDER — LEVETIRACETAM 1000 MG PO TABS: ORAL_TABLET | ORAL | 3 refills | Status: DC

## 2018-08-08 MED ORDER — LAMOTRIGINE 100 MG PO TABS: ORAL_TABLET | ORAL | 11 refills | Status: DC

## 2018-08-08 NOTE — Progress Notes (Signed)
Virtual Visit via Video Note The purpose of this virtual visit is to provide medical care while limiting exposure to the novel coronavirus.    Consent was obtained for video visit:  Yes.   Answered questions that patient had about telehealth interaction:  Yes.   I discussed the limitations, risks, security and privacy concerns of performing an evaluation and management service by telemedicine. I also discussed with the patient that there may be a patient responsible charge related to this service. The patient expressed understanding and agreed to proceed.  Pt location: Home Physician Location: office Name of referring provider:  Shelva Majestic, MD I connected with Gregg Young at patients initiation/request on 08/08/2018 at  1:00 PM EDT by video enabled telemedicine application and verified that I am speaking with the correct person using two identifiers. 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 His mother is present during this e-visit to provide additional information. Since his last visit, he has been to [facility] twice for DBS adjustment, he was there yesterday. He and his mother report that seizure frequency is unchanged, and possibly worse, with around 3 seizures daily. They continue to remain the same with brief right head turn and unresponsiveness. He has not had any falls. He has a VNS which was interrogated at [facility] yesterday, no changes made. He continues to 5 AEDs, Keppra  BID, Onfi  in AM,  in PM, Vimpat  in AM,  in PM, Lamictal  TID, Lyrica  TID. His mother was concerned about weight gain (although they report he lost 3 lbs with weight on visit yesterday) and mild ankle swelling. For the past 2 days, he has had brief shock-like pains on the right side of his neck. He has not had any  today. He denies any posterior neck pain. He denies any headaches, dizziness, vision changes, focal numbness/tingling/weakness. He reports sleep is fine, he is unable to tolerate CPAP (sleep study showed severe sleep apnea). His mother feels the increase in seizures are due to increased stress with current stay-at-home restrictions with Covid-19 pandemic.   Seizure History: This is a 43 yo RH man with a history of seizures since age 31. He has no recollection of events, no prior warning, witnessed by his mother to have a generalized convulsion lasting 90 to 120 seconds. He was brought to [facility], [place] then had another convulsion 2 days later. They recall trying different medications, Depakote caused liver dysfunction, he has failed Dilantin and Zonegran. He has been on Keppra, Lamictal, Lyrica, and most recently Vimpat. He had an EMU admission at [facility], records unavailable for review, per notes he stayed for 36 hours and had generalized seizures, multiple foci. They report two admissions for status epilepticus, one in September 2002 in the setting of weaning off Depakote, and another in January 2003. Records unavailable for review. His last GTC was around 5 years ago. He continued to have "petit mals" several times a day where his eyes would roll back, hands would shake for 30-45 seconds, if standing he would fall and had required sutures and staples in the past. He had been having the "petit mals" several times daily until 3 weeks ago when he started having a different type of episode and the petit mals "completely stopped." He was brought to the ER on 12/07/13 when his  parents awoke to him dry having and speaking gibberish. He would be unable to speak, control his limbs, and cannot stand up without assistance. He can hear people around him but his jaw feels tight. The episodes can last for several hours. He has had 4 episodes in the past 3 weeks. The patient reports that they have been occurring only during  the weekends, however his parents remind him he had one on Wednesday. He went to his neurologist's office on 09/10 where he had an episode while walking to the exam room where he became dizzy and fell. His father was able to catch him, and they reported his speech became affected. He was noted to be speaking in a whisper throughout the visit. He does note that he becomes more dizzy after taking his medications. Lamictal level was 17.8, Vimpat level 7.3.   The patient lives with his parents and brother. He is on disability and mostly stays at home playing video games. He graduated high school, no special ed classes. He endorses olfactory hallucinations but cannot describe it except saying they are the same all the time.   Epilepsy Risk Factors: He had a skull fracture on the right side after a fall at 44 months of age. Otherwise he had a normal birth and early development. There is no history of febrile convulsions, CNS infections such as meningitis/encephalitis, neurosurgical procedures, or family history of seizures.  Prior AEDs: Depakote, Dilantin, Zonegran  EEGs: 48-hour EEG (03/17/14 to 03/19/14) abnormal with multifocal discharges and right temporal slowing. Prior records note "generalized seizures, multiple foci" He had a 72-hour EEG (09/26/16 to 09/29/16) which was abnormal with bursts and runs of focal slowing over the right temporal region, bursts and runs of diffuse rhythmic slowing with intermixed generalized spikes lasting up to 120 seconds without clinical correlate, multifocal epileptiform discharges seen over the right temporal, left frontal regions, as well as generalized spikes and polyspikes. During sleep, there were bursts of generalized fast activity. There were 2 clinicoelectrographic seizures captured with right head turn and some hypermotor activity that were non-lateralizing on EEG with diffuse background suppression at seizure onset.  MRI: I personally reviewed MRI brain with  and without contrast done 12/26/2013 which did not show any acute intracranial abnormality, hippocampi symmetric without abnormal signal or enhancement.  [facility] with repeat MRI brain 07/2017 showing no clear abnormality, PET scan with questionable decreased uptake on the left, Neuropsych testing suggesting more right-sided dysfunction. He underwent sEEG which was non-lateralizing, broad activity at seizure onset with low voltage fast activity from left and right, insular origin also suspicious. He had DBS placed last 10/2017 for non-resective stimulation treatment.     Observations/Objective:   Vitals:   08/08/18 1136  BP: 120/80  Weight: 260 lb (117.9 kg)  Height: 5\' 7"  (1.702 m)   Patient is awake, alert, oriented x 3. No aphasia or dysarthria. Intact fluency and comprehension. Remote and recent memory intact. Able to name and repeat. Cranial nerves: Extraocular movements intact with no nystagmus. No facial asymmetry. Motor: moves all extremities symmetrically, at least anti-gravity x 4. No incoordination on finger to nose testing. Gait: hunched posture, narrow-based and steady, able to tandem walk adequately. Negative Romberg test.  Assessment and Plan:   This is a 43 yo RH man with intractable multifocal epilepsy. Family reports seizures started at age 43. He continues to have multiple daily seizures on 5 AEDs, s/p VNS (2016), and recently DBS (2019).  DBS was adjusted yesterday. We discussed continuation of medications at this time, they agreed to hold off on medication reduction to avoid ER visits due to current pandemic. His mother was concerned about weight gain and ankle swelling, continue to monitor, he denies any shortness of breath/chest pain. Refills for his medications were sent today. He is reporting occasional right-sided neck "shocks" the past 2 days, none today, continue to monitor. He does not drive. He will follow-up in 6 months and knows to  call our office for any problems.    Follow Up Instructions:   -I discussed the assessment and treatment plan with the patient/mother. The patient/mother were provided an opportunity to ask questions and all were answered. The patient/mother agreed with the plan and demonstrated an understanding of the instructions.   The patient/mother were advised to call back or seek an in-person evaluation if the symptoms worsen or if the condition fails to improve as anticipated.    Van Clines, MD

## 2018-08-14 ENCOUNTER — Ambulatory Visit: Payer: PPO | Admitting: Neurology

## 2018-08-25 ENCOUNTER — Other Ambulatory Visit: Payer: Self-pay | Admitting: Neurology

## 2018-09-02 ENCOUNTER — Ambulatory Visit (HOSPITAL_COMMUNITY)
Admission: EM | Admit: 2018-09-02 | Discharge: 2018-09-02 | Disposition: A | Discharge status: Home / Self Care | Payer: PPO | Attending: Family Medicine | Admitting: Family Medicine

## 2018-09-02 ENCOUNTER — Other Ambulatory Visit: Payer: Self-pay

## 2018-09-02 ENCOUNTER — Encounter (HOSPITAL_COMMUNITY): Payer: Self-pay | Admitting: Family Medicine

## 2018-09-02 DIAGNOSIS — H1031 Unspecified acute conjunctivitis, right eye: Secondary | ICD-10-CM

## 2018-09-02 MED ORDER — TOBRAMYCIN 0.3 % OP SOLN: 1.0000 [drp] | OPHTHALMIC | 0 refills | Status: DC

## 2018-09-02 NOTE — Discharge Instructions (Addendum)
Wash that pillow case

## 2018-09-02 NOTE — ED Provider Notes (Signed)
[facility]    CSN: [id number] Arrival date & time: 09/02/18  1202     History   Chief Complaint Chief Complaint  Patient presents with  . Facial Swelling    HPI Gregg Young is a 43 y.o. male.   Per pt he woke up this morning with eye swollen and redness. There are small white area that are raised around the eye. He's had no pain with this.  He's had a filmy discharge from the right eye for about a month.  Redness and periorbital swelling commenced a couple days ago     Past Medical History:  Diagnosis Date  . History of kidney stones   . Seizures (HCC)    daily    Patient Active Problem List   Diagnosis Date Noted  . 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 Obstructive sleep apnea 05/08/2017  . Excessive daytime sleepiness 02/28/2017  . Dizziness and giddiness 07/08/2014  . 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 Partial symptomatic epilepsy (HCC) 07/13/2012    Past Surgical History:  Procedure Laterality Date  . HERNIA REPAIR     baby  . VAGUS NERVE STIMULATOR INSERTION Left 03/10/2015   Procedure: LEFT SIDED PLACEMENT VAGAL NERVE STIMULATOR IMPLANT;  Surgeon: Lisbeth RenshawNeelesh Nundkumar, MD;  Location: [facility];  Service: Neurosurgery;  Laterality: Left;       Home Medications    Prior to Admission medications   Medication Sig Start Date End Date Taking? Authorizing Provider  cloBAZam (ONFI) 10 MG tablet Take 2 tabs in AM, 3 tabs at bedtime 08/08/18   Van ClinesAquino, Karen M, MD  lacosamide (VIMPAT) 50 MG TABS tablet TAKE 1 TABLET BY MOUTH EVERY MORNING AND TAKE 2 TABLETS NIGHTLY AT BEDTIME 08/08/18   Van ClinesAquino, Karen M, MD  lamoTRIgine (LAMICTAL) 100 MG tablet Take 2 tablets three times a day 08/08/18   Van ClinesAquino, Karen M, MD  levETIRAcetam (KEPPRA) 1000 MG tablet TAKE 2 TABLETS BY MOUTH 2 TIMES DAILY 08/08/18   Van ClinesAquino, Karen M, MD  pregabalin (LYRICA) 225 MG capsule Take  1 capsule (225 mg total) by mouth 3 (three) times daily. 08/08/18   Van ClinesAquino, Karen M, MD  tobramycin (TOBREX) 0.3 % ophthalmic solution Place 1 drop into the right eye every 4 (four) hours. 09/02/18   Elvina SidleLauenstein, Shakeya Kerkman, MD    Family History Family History  Problem Relation Age of Onset  . Healthy Mother   . Healthy Father     Social History Social History   Tobacco Use  . Smoking status: Former Smoker    Packs/day: 0.50    Years: 4.00    Pack years: 2.00    Types: Cigarettes    Last attempt to quit: 03/02/1985    Years since quitting: 33.5  . Smokeless tobacco: Never Used  Substance Use Topics  . Alcohol use: No    Alcohol/week: 0.0 standard drinks  . Drug use: No     Allergies   Penicillins   Review of Systems Review of Systems   Physical Exam Triage Vital Signs ED Triage Vitals  Enc Vitals Group     BP 09/02/18 1221 120/81     Pulse Rate 09/02/18 1221 (!) 102     Resp 09/02/18 1221 18     Temp 09/02/18 1221 98.6 F (37 C)     Temp src --      SpO2 09/02/18 1221 97 %     Weight --  Height --      Head Circumference --      Peak Flow --      Pain Score 09/02/18 1222 0     Pain Loc --      Pain Edu? --      Excl. in GC? --    No data found.  Updated Vital Signs BP 120/81 (BP Location: Right Arm)   Pulse (!) 102   Temp 98.6 F (37 C)   Resp 18   SpO2 97%    Physical Exam   UC Treatments / Results  Labs (all labs ordered are listed, but only abnormal results are displayed) Labs Reviewed - No data to display  EKG None  Radiology No results found.  Procedures Procedures (including critical care time)  Medications Ordered in UC Medications - No data to display  Initial Impression / Assessment and Plan / UC Course  I have reviewed the triage vital signs and the nursing notes.  Pertinent labs & imaging results that were available during my care of the patient were reviewed by me and considered in my medical decision making (see chart  for details).    Final Clinical Impressions(s) / UC Diagnoses   Final diagnoses:  Acute bacterial conjunctivitis of right eye     Discharge Instructions     Wash that pillow case    ED Prescriptions    Medication Sig Dispense Auth. Provider   tobramycin (TOBREX) 0.3 % ophthalmic solution Place 1 drop into the right eye every 4 (four) hours. 5 mL Elvina Sidle, MD     Controlled Substance Prescriptions [place] Controlled Substance Registry consulted? Not Applicable   Elvina Sidle, MD 09/02/18 1247

## 2018-09-02 NOTE — ED Triage Notes (Signed)
Per pt he woke up thi morning with eye swollen and redness. There are small white area that are raised around the eye. No pain what so ever he said.

## 2018-09-24 ENCOUNTER — Other Ambulatory Visit: Payer: Self-pay

## 2018-09-24 ENCOUNTER — Telehealth: Payer: Self-pay | Admitting: Neurology

## 2018-09-24 ENCOUNTER — Ambulatory Visit (HOSPITAL_COMMUNITY)
Admission: RE | Admit: 2018-09-24 | Discharge: 2018-09-24 | Disposition: A | Discharge status: Home / Self Care | Payer: PPO | Source: Ambulatory Visit | Attending: Neurology | Admitting: Neurology

## 2018-09-24 DIAGNOSIS — W19XXXA Unspecified fall, initial encounter: Secondary | ICD-10-CM | POA: Diagnosis not present

## 2018-09-24 DIAGNOSIS — R4182 Altered mental status, unspecified: Secondary | ICD-10-CM

## 2018-09-24 DIAGNOSIS — S0003XA Contusion of scalp, initial encounter: Secondary | ICD-10-CM | POA: Insufficient documentation

## 2018-09-24 NOTE — Telephone Encounter (Signed)
Pt's mother Hassan Rowan called again stating that pt needs to be seen in the office today, she stated that she spoke with Dr. On call last night who advised that pt needed to go to the ED but MD did not want to go to the ED to see pt so they did not go. Pls call her back.

## 2018-09-24 NOTE — Telephone Encounter (Signed)
We can offer her a later appointment in the week if you have a spot open.

## 2018-09-24 NOTE — Telephone Encounter (Signed)
Pt's mother left message with answering service stating that pt had a seizure on Saturday and fell, she would like some advise.

## 2018-09-24 NOTE — Telephone Encounter (Signed)
Pt is sch for Wed at 12:00

## 2018-09-24 NOTE — Telephone Encounter (Signed)
Spoke with pt mother she stated that he had a fall on Saturday not a seizure she said that he has a goose egg and a swollen black eye. On call MD told them to go the ER to be seen Pt refused to go to the ER , pt mother wants him to be seen today by MD in person, refused e-visit. She was informed that Dr. Delice Lesch was not in the office she said she doesn't care who sees him in the office as long a Dr in this office gets him an appointment today,   Current medications prescribed by Dr. Delice Lesch:  Onfi 10 mg 2tab am 3 tab pm vimpat 50 mg 1 tab am 2 tab pm lamictal 2 tab TID Keppra 2 tab BID Lyrica 225mg  TID

## 2018-09-24 NOTE — Telephone Encounter (Signed)
In-office MDs are booked. Pls order head CT without contrast, Dx: fall. I can see him in-office on Wed if necessary, depending on results of head CT, if any acute changes, will need to go to ER

## 2018-09-24 NOTE — Telephone Encounter (Signed)
Patient mother wants patient seen even if we dont have the results of the test that are to be done today at 3:00. Please advise

## 2018-09-24 NOTE — Progress Notes (Addendum)
Called spoke with pt mom Gregg Young after talking to Dr. Delice Lesch   We can order a stat head CT, Dx Fall   Pt mom agrees to have head CT order placed.   Central scheduling called  Pt is scheduled for STAT CT at [facility] at 3:30pm pt needs to be at the hospital at 3:15pm  Pt father called Gregg Young  At [phone] informed of pt appointment time for CT at [facility] at pt needs to be there at 3:15 CT is scheduled for 3:30. Pt father verbalized understanding,

## 2018-09-24 NOTE — Telephone Encounter (Signed)
I can see him at noon on Wed or Friday. Thanks

## 2018-09-25 ENCOUNTER — Telehealth: Payer: Self-pay

## 2018-09-25 NOTE — Telephone Encounter (Signed)
Spoke with pt mother given CT results there is no evidence of bleed inside the brain, DBS looks good. No fracture seen. Verbalized understanding pt has an appointment tomorrow 09/26/2018

## 2018-09-26 ENCOUNTER — Other Ambulatory Visit: Payer: Self-pay

## 2018-09-26 ENCOUNTER — Telehealth (INDEPENDENT_AMBULATORY_CARE_PROVIDER_SITE_OTHER): Payer: PPO | Admitting: Neurology

## 2018-09-26 VITALS — Ht 66.0 in | Wt 230.0 lb

## 2018-09-26 DIAGNOSIS — W19XXXA Unspecified fall, initial encounter: Secondary | ICD-10-CM

## 2018-09-26 DIAGNOSIS — G40211 Localization-related (focal) (partial) symptomatic epilepsy and epileptic syndromes with complex partial seizures, intractable, with status epilepticus: Secondary | ICD-10-CM

## 2018-09-26 MED ORDER — LACOSAMIDE 50 MG PO TABS: ORAL_TABLET | ORAL | 5 refills | Status: DC

## 2018-09-26 NOTE — Progress Notes (Signed)
Virtual Visit via Video Note The purpose of this virtual visit is to provide medical care while limiting exposure to the novel coronavirus.    Consent was obtained for video visit:  Yes.   Answered questions that patient had about telehealth interaction:  Yes.   I discussed the limitations, risks, security and privacy concerns of performing an evaluation and management service by telemedicine. I also discussed with the patient that there may be a patient responsible charge related to this service. The patient expressed understanding and agreed to proceed.  Pt location: Home Physician Location: office Name of referring provider:  Hunter, Stephen O, MD I connected with Gregg Young at patients initiation/request on 09/26/2018 at  1:30 PM EDT by video enabled telemedicine application and verified that I am speaking with the coShelva Majesticrrect person using two identifiers. Pt MRN:  [id number] Pt DOB:  September 11, 1975 Video Participants:  Gregg Young;  Adonis BrookJohn Reif (father)   History of Present Illness:  The patient was last seen 6 weeks ago for intractable multifocal epilepsy and presents today for an urgent visit after a fall last weekend where he sustained a left periorbital hematoma and hit his head. He was walking then suddenly fell and passed out from a seizure, he does not recall falling. Since he has a DBS, a stat head CT done on 09/24/2018 was done which I personally reviewed, no acute changes, DBS electrodes had no adverse features. There was a superficial left periorbital and scalp hematoma without underlying fracture seen. He also had a fall the week prior from a seizure. His father reports he has some bad days where he is having 5 bad seizures in a day. He feels the increase in seizures are due to his sleep schedule getting 6-7 hours of sleep, he is a little drowsy during today's visit. He does have severe sleep apnea but could not tolerate his CPAP machine and "got rid of it." He is on 5 AEDs with no  side effects, Keppra 2000mg  BID, Onfi 20mg  in AM, 30mg  in PM, Vimpat 50mg  in AM, 100mg  in PM, Lamictal 200mg  TID, Lyrica 225mg  TID. He also has a VNS and a DBS and [facility] at [facility].   Seizure History: This is a 43 yo RH man with a history of seizures since age 43. He has no recollection of events, no prior warning, witnessed by his mother to have a generalized convulsion lasting 90 to 120 seconds. He was brought to [facility] then had another convulsion 2 days later. They recall trying different medications, Depakote caused liver dysfunction, he has failed Dilantin and Zonegran. He has been on Keppra, Lamictal, Lyrica, and most recently Vimpat. He had an [facility], records unavailable for review, per notes he stayed for 36 hours and had generalized seizures, multiple foci. They report two admissions for status epilepticus, one in September 2002 in the setting of weaning off Depakote, and another in January 2003. Records unavailable for review. His last GTC was around 5 years ago. He continued to have "petit mals" several times a day where his eyes would roll back, hands would shake for 30-45 seconds, if standing he would fall and had required sutures and staples in the past. He had been having the "petit mals" several times daily until 3 weeks ago when he started having a different type of episode and the petit mals "completely stopped." He was brought to the ER on 12/07/13 when his parents awoke to him dry having and speaking  gibberish. He would be unable to speak, control his limbs, and cannot stand up without assistance. He can hear people around him but his jaw feels tight. The episodes can last for several hours. He has had 4 episodes in the past 3 weeks. The patient reports that they have been occurring only during the weekends, however his parents remind him he had one on Wednesday. He went to his neurologist's office on 09/10 where he had an episode while walking to the exam room where  he became dizzy and fell. His father was able to catch him, and they reported his speech became affected. He was noted to be speaking in a whisper throughout the visit. He does note that he becomes more dizzy after taking his medications. Lamictal level was 17.8, Vimpat level 7.3.   The patient lives with his parents and brother. He is on disability and mostly stays at home playing video games. He graduated high school, no special ed classes. He endorses olfactory hallucinations but cannot describe it except saying they are the same all the time.   Epilepsy Risk Factors: He had a skull fracture on the right side after a fall at 646 months of age. Otherwise he had a normal birth and early development. There is no history of febrile convulsions, CNS infections such as meningitis/encephalitis, neurosurgical procedures, or family history of seizures.  Prior AEDs: Depakote, Dilantin, Zonegran  EEGs: 48-hour EEG (03/17/14 to 03/19/14) abnormal with multifocal discharges and right temporal slowing. Prior records note "generalized seizures, multiple foci" He had a 72-hour EEG (09/26/16 to 09/29/16) which was abnormal with bursts and runs of focal slowing over the right temporal region, bursts and runs of diffuse rhythmic slowing with intermixed generalized spikes lasting up to 120 seconds without clinical correlate, multifocal epileptiform discharges seen over the right temporal, left frontal regions, as well as generalized spikes and polyspikes. During sleep, there were bursts of generalized fast activity. There were 2 clinicoelectrographic seizures captured with right head turn and some hypermotor activity that were non-lateralizing on EEG with diffuse background suppression at seizure onset.  MRI: I personally reviewed MRI brain with and without contrast done 12/26/2013 which did not show any acute intracranial abnormality, hippocampi symmetric without abnormal signal or enhancement.  He has had an  extensive evaluation [facility] in 2019 with repeat MRI brain 07/2017 showing no clear abnormality, PET scan with questionable decreased uptake on the left, Neuropsych testing suggesting more right-sided dysfunction. He underwent sEEG which was non-lateralizing, broad activity at seizure onset with low voltage fast activity from left and right, insular origin also suspicious. He had DBS placed last 10/2017 for non-resective stimulation treatment.    Current Outpatient Medications on File Prior to Visit  Medication Sig Dispense Refill   cloBAZam (ONFI) 10 MG tablet Take 2 tabs in AM, 3 tabs at bedtime 150 tablet 5   lacosamide (VIMPAT) 50 MG TABS tablet TAKE 1 TABLET BY MOUTH EVERY MORNING AND TAKE 2 TABLETS NIGHTLY AT BEDTIME 90 tablet 5   lamoTRIgine (LAMICTAL) 100 MG tablet Take 2 tablets three times a day 180 tablet 11   levETIRAcetam (KEPPRA) 1000 MG tablet TAKE 2 TABLETS BY MOUTH 2 TIMES DAILY 120 tablet 3   pregabalin (LYRICA) 225 MG capsule Take 1 capsule (225 mg total) by mouth 3 (three) times daily. 270 capsule 3   No current facility-administered medications on file prior to visit.      Observations/Objective:   Vitals:   09/26/18 1323  Weight: 230 lb (  104.3 kg)  Height: 5\' 6"  (1.676 m)   GEN:  The patient appears stated age and is in NAD. He has a left periorbital hematoma. He is a little drowsy during the visit and falls asleep when not stimulated.  Neurological examination: Patient is awake, alert, oriented x 3. No aphasia or dysarthria. Intact fluency and comprehension. Remote and recent memory intact. Able to name and repeat. Cranial nerves: Extraocular movements intact with no nystagmus. No facial asymmetry. Motor: moves all extremities symmetrically, at least anti-gravity x 4. No incoordination on finger to nose testing. Gait: narrow-based and steady, able to tandem walk adequately. Negative Romberg test.  Assessment and Plan:   This is a 43 yo RH man with intractable  multifocal epilepsy. Family reports seizures started at age 71. He continues to have multiple daily seizures on 5 AEDs, s/p VNS (2016), and recently DBS (2019). He is again having an increase in falls due to seizures, we discussed increasing one of his seizure medications, he is agreeable to increasing Vimpat to 100mg  BID. He had dizziness in the past, continue to monitor, if no side effects will send in an Rx for 100mg  tablet BID. He will update our office in 2 weeks. Continue Keppra 2000mg  BID, Onfi 20mg  in AM, 30mg  in PM, Lamictal 200mg  TID, Lyrica 225mg  TID. Continue follow-up at [facility] for DBS. He does not drive. He will follow-up as scheduled in November 2020 and knows to call our office for any problems.    Follow Up Instructions:    -I discussed the assessment and treatment plan with the patient. The patient was provided an opportunity to ask questions and all were answered. The patient agreed with the plan and demonstrated an understanding of the instructions.   The patient was advised to call back or seek an in-person evaluation if the symptoms worsen or if the condition fails to improve as anticipated.     Cameron Sprang, MD

## 2018-10-25 ENCOUNTER — Telehealth: Payer: Self-pay | Admitting: Neurology

## 2018-10-25 NOTE — Telephone Encounter (Signed)
Left message for pts mom on home and cell number

## 2018-10-25 NOTE — Telephone Encounter (Signed)
Lots of falls  Lots of seizures 5-10 per day. Lasting about 45 sec a piece Hits head   Recent CT scan per mom  Staples in head, black eye  Vimpat 2 bid not helping. Needs new script if needs to continue or increase dose.  Has sleep apnea bad. Does not wear CPAP  Confirmed [facility] on [address]  Has an appt with Dr. Rona Ravens  (Neuro at [facility]) in Aug

## 2018-10-25 NOTE — Telephone Encounter (Signed)
New Message  Patient's mom verbalized Vimpat has not done anything and needs a new refill with correct dosage.

## 2018-10-26 ENCOUNTER — Other Ambulatory Visit: Payer: Self-pay | Admitting: Neurology

## 2018-10-26 DIAGNOSIS — G40211 Localization-related (focal) (partial) symptomatic epilepsy and epileptic syndromes with complex partial seizures, intractable, with status epilepticus: Secondary | ICD-10-CM

## 2018-10-26 MED ORDER — LACOSAMIDE 50 MG PO TABS: ORAL_TABLET | ORAL | 5 refills | Status: DC

## 2018-10-26 MED ORDER — CLOBAZAM 10 MG PO TABS: ORAL_TABLET | ORAL | 5 refills | Status: DC

## 2018-10-26 NOTE — Telephone Encounter (Signed)
Left message for pt's mom, Hassan Rowan informing of Dr. Amparo Bristol recommendations, refills being sent, and that the [facility] may cause drowsiness. Instructed mom to call back with any concerns.

## 2018-10-26 NOTE — Telephone Encounter (Signed)
Pls let his mother know that I would continue on Vimpat 50mg  2 tabs BID, but also try increasing the Onfi 10mg : take 3 tabs BID. I will send in Rx for both. The increase in [place] may make him more drowsy. Thanks

## 2018-11-27 DIAGNOSIS — G40219 Localization-related (focal) (partial) symptomatic epilepsy and epileptic syndromes with complex partial seizures, intractable, without status epilepticus: Secondary | ICD-10-CM | POA: Diagnosis not present

## 2018-11-30 ENCOUNTER — Telehealth: Payer: Self-pay

## 2018-11-30 NOTE — Telephone Encounter (Signed)
[facility] request form to approve Onfi.  Pt has tried and failed Depakote, Dilantin, Zonegran

## 2018-12-05 ENCOUNTER — Telehealth: Payer: Self-pay

## 2018-12-05 NOTE — Telephone Encounter (Signed)
Onfi (Clobazam) has been approved through [facility] until 04/04/19.  [facility] informed

## 2018-12-24 ENCOUNTER — Other Ambulatory Visit: Payer: Self-pay | Admitting: Neurology

## 2019-01-02 ENCOUNTER — Other Ambulatory Visit: Payer: Self-pay

## 2019-01-02 ENCOUNTER — Ambulatory Visit: Payer: PPO

## 2019-01-07 ENCOUNTER — Telehealth: Payer: Self-pay | Admitting: Internal Medicine

## 2019-01-07 NOTE — Telephone Encounter (Signed)
Spoke with pt's mother, Hassan Rowan. States that pt would like to try a certain kind of nasal pillow CPAP mask. Advised her that we have not seen him in over 1.5 years and he would need to be seen before we could order anything like that for him. Pt has been scheduled to see Dr. Annamaria Boots on 01/10/2019 at 1400. Nothing further was needed.

## 2019-01-10 ENCOUNTER — Ambulatory Visit (INDEPENDENT_AMBULATORY_CARE_PROVIDER_SITE_OTHER): Payer: PPO | Admitting: Internal Medicine

## 2019-01-10 ENCOUNTER — Other Ambulatory Visit: Payer: Self-pay

## 2019-01-10 ENCOUNTER — Encounter: Payer: Self-pay | Admitting: Internal Medicine

## 2019-01-10 VITALS — BP 126/88 | HR 91 | Temp 97.9°F | Ht 66.0 in | Wt 264.0 lb

## 2019-01-10 DIAGNOSIS — G40219 Localization-related (focal) (partial) symptomatic epilepsy and epileptic syndromes with complex partial seizures, intractable, without status epilepticus: Secondary | ICD-10-CM

## 2019-01-10 DIAGNOSIS — G4733 Obstructive sleep apnea (adult) (pediatric): Secondary | ICD-10-CM

## 2019-01-10 DIAGNOSIS — G4731 Primary central sleep apnea: Secondary | ICD-10-CM

## 2019-01-10 NOTE — Progress Notes (Signed)
05/08/17-43 year old male for sleep evaluation. Sleep consult referred by Dr Wilmon Arms had sleep study,wakes up several times per night.  No prior sleep study.  Here with father. 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 Karel Jarvis. He recently spent several days hospitalized at [facility] for continuous EEG.  The doctors there requested to get a sleep study but he already had this appointment for evaluation here.  Father says seizures are not confined to sleep. They report loud snoring, not sure about witnessed apneas.  Daytime sleepiness. No ENT surgery, lung or heart disease.  No sleep medicines other than seizure meds. Usual bedtime anywhere between 11 PM and 1:30 AM, watching TV to help sleep.  Sleep latency 15-20 minutes, waking twice during the night. 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 Karel Jarvis NPSG 05/18/17- Mixed Apnea with central and obstructive, AHI 36.6/ hr, desaturation to 84%, body weight 240 lbs, titration required BIPAP 18/14. BIPAP 18/14, PS 3, Back up rate 5/ [employer]   DC'd 08/10/17 Here with father. Never got really started with BIPAP- wants to try. . Patient had seizure with head to right, tonic R arm, lasting 20 seconds. Father held him. Has brain stimulator implanted by [facility]- "so far not helpful". Had flu vax.  ROS-see HPI   + = positive Constitutional:    weight loss, night sweats, fevers, chills, fatigue, lassitude. HEENT:    headaches, difficulty swallowing, tooth/dental problems, sore throat,       sneezing, itching, ear ache, nasal congestion, post nasal drip, snoring CV:    chest pain, orthopnea, PND, swelling in lower extremities, anasarca,                                                    dizziness, palpitations Resp:   shortness of breath with exertion or at rest.    productive cough,   non-productive cough, coughing up of blood.              change in color of mucus.  wheezing.   Skin:    rash or lesions. GI:  No-   heartburn, indigestion, abdominal pain, nausea, vomiting, diarrhea,                 change in bowel habits, loss of appetite GU: dysuria, change in color of urine, no urgency or frequency.   flank pain. MS:   joint pain, stiffness, decreased range of motion, back pain. Neuro-     nothing unusual Psych:  change in mood or affect.  +depression or anxiety.   memory loss.  OBJ- Physical Exam General- Alert, Oriented, Affect-appropriate, Distress- none acute, + obese, thick neck Skin- rash-none, lesions- none, excoriation- none Lymphadenopathy- none Head- atraumatic            Eyes- Gross vision intact, PERRLA, conjunctivae and secretions clear            Ears- Hearing, canals-normal            Nose- Clear, no-Septal dev, mucus, polyps, erosion, perforation             Throat- Mallampati IV , mucosa clear , drainage- none, tonsils + residual Neck- flexible , trachea midline, no stridor , thyroid nl, carotid no bruit Chest -  symmetrical excursion , unlabored           Heart/CV- RRR , no murmur , no gallop  , no rub, nl s1 s2                           - JVD- none , edema- none, stasis changes- none, varices- none           Lung- clear to P&A, wheeze- none, cough- none , dullness-none, rub- none           Chest wall-  Abd-  Br/ Gen/ Rectal- Not done, not indicated Extrem- cyanosis- none, clubbing, none, atrophy- none, strength- nl Neuro- + self limited tonic>clonic seizure, head to R, mostly R arm.

## 2019-01-10 NOTE — Patient Instructions (Addendum)
Order- new DME new BIPAP 18/14, PS 3, back up rate 5  Dx complex sleep apnea Mask of choice, humidifier, supplies, AirView/ card  Please call if we can help

## 2019-01-11 NOTE — Assessment & Plan Note (Signed)
Not sure how neurology is currently classifying this. Typical seizure, per father, while at office for this visit. Brain stimulator is being adjusted at [facility].

## 2019-01-11 NOTE — Assessment & Plan Note (Signed)
Wants to try with BIPAP. Discussed with him and father. May need mask fitting. Plan- BIPAP 18/14, PS 3, back up rate 5

## 2019-02-05 ENCOUNTER — Ambulatory Visit: Payer: PPO | Admitting: Neurology

## 2019-02-12 DIAGNOSIS — Z23 Encounter for immunization: Secondary | ICD-10-CM | POA: Diagnosis not present

## 2019-02-12 DIAGNOSIS — G40219 Localization-related (focal) (partial) symptomatic epilepsy and epileptic syndromes with complex partial seizures, intractable, without status epilepticus: Secondary | ICD-10-CM | POA: Diagnosis not present

## 2019-02-13 ENCOUNTER — Other Ambulatory Visit: Payer: Self-pay | Admitting: Neurology

## 2019-02-13 NOTE — Telephone Encounter (Signed)
Please review for Dr. Delice Lesch. Pt was last seen in June this year for Seizures. He has a follow up scheduled in Jan.

## 2019-02-15 DIAGNOSIS — G4733 Obstructive sleep apnea (adult) (pediatric): Secondary | ICD-10-CM | POA: Diagnosis not present

## 2019-03-05 DIAGNOSIS — L918 Other hypertrophic disorders of the skin: Secondary | ICD-10-CM | POA: Diagnosis not present

## 2019-03-05 DIAGNOSIS — L909 Atrophic disorder of skin, unspecified: Secondary | ICD-10-CM | POA: Diagnosis not present

## 2019-03-13 ENCOUNTER — Telehealth: Payer: Self-pay | Admitting: Neurology

## 2019-03-13 NOTE — Telephone Encounter (Signed)
Patient's mom called requesting a call back from a nurse. She'd like to know if it's okay for the patient to take a new OTC product called Refresh with 2 MG of melatonin in it.

## 2019-03-14 NOTE — Telephone Encounter (Signed)
Call not routed in error.

## 2019-03-15 NOTE — Telephone Encounter (Signed)
Dr. Delice Lesch,  Are you ok with this OTC medication?

## 2019-03-15 NOTE — Telephone Encounter (Signed)
Mom informed ok to try Melatonin

## 2019-03-15 NOTE — Telephone Encounter (Signed)
I think it should be okay. Thanks

## 2019-03-17 DIAGNOSIS — G4733 Obstructive sleep apnea (adult) (pediatric): Secondary | ICD-10-CM | POA: Diagnosis not present

## 2019-04-11 ENCOUNTER — Ambulatory Visit: Payer: PPO | Admitting: Internal Medicine

## 2019-04-17 DIAGNOSIS — G4733 Obstructive sleep apnea (adult) (pediatric): Secondary | ICD-10-CM | POA: Diagnosis not present

## 2019-04-24 ENCOUNTER — Other Ambulatory Visit: Payer: Self-pay

## 2019-04-24 ENCOUNTER — Telehealth: Payer: PPO | Admitting: Neurology

## 2019-04-25 ENCOUNTER — Telehealth: Payer: Self-pay | Admitting: Family Medicine

## 2019-04-25 NOTE — Telephone Encounter (Signed)
I left a message asking the patient, spouse and son to call and schedule Medicare AWV with Courtney ([facility]).  If patient calls back, please schedule Medicare Wellness Visit (initial) at next available opening. VDM (Dee-Dee) 

## 2019-05-13 ENCOUNTER — Other Ambulatory Visit: Payer: Self-pay | Admitting: Neurology

## 2019-05-13 DIAGNOSIS — G40211 Localization-related (focal) (partial) symptomatic epilepsy and epileptic syndromes with complex partial seizures, intractable, with status epilepticus: Secondary | ICD-10-CM

## 2019-05-13 MED ORDER — LACOSAMIDE 50 MG PO TABS: ORAL_TABLET | ORAL | 5 refills | Status: DC

## 2019-05-13 MED ORDER — CLOBAZAM 10 MG PO TABS: ORAL_TABLET | ORAL | 5 refills | Status: DC

## 2019-05-17 ENCOUNTER — Encounter: Payer: Self-pay | Admitting: Neurology

## 2019-05-17 ENCOUNTER — Ambulatory Visit (INDEPENDENT_AMBULATORY_CARE_PROVIDER_SITE_OTHER): Payer: PPO | Admitting: Neurology

## 2019-05-17 ENCOUNTER — Other Ambulatory Visit: Payer: Self-pay

## 2019-05-17 ENCOUNTER — Other Ambulatory Visit: Payer: PPO

## 2019-05-17 VITALS — BP 139/82 | HR 88 | Ht 66.0 in | Wt 272.8 lb

## 2019-05-17 DIAGNOSIS — G40211 Localization-related (focal) (partial) symptomatic epilepsy and epileptic syndromes with complex partial seizures, intractable, with status epilepticus: Secondary | ICD-10-CM

## 2019-05-17 DIAGNOSIS — R4182 Altered mental status, unspecified: Secondary | ICD-10-CM | POA: Diagnosis not present

## 2019-05-17 NOTE — Progress Notes (Signed)
NEUROLOGY FOLLOW UP OFFICE NOTE  ROMELLO HOEHN [id number] 04-05-1975  HISTORY OF PRESENT ILLNESS: I had the pleasure of seeing Gregg Young in follow-up in the neurology clinic on 05/17/2019.  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 He is accompanied by his father who helps supplement the history today.  Records and images were personally reviewed where available. Since his last visit, he has been seeing Dr. Laveda Norman at the [facility] with DBS adjustments made. Unfortunately, he continues to have frequent near-daily seizures. His father reports a seizure just a few minutes prior to the visit today. His last fall due to a seizure was in December. He is on 5 AEDs, as well as s/p VNS and DBS placement. Vimpat dose was increased to 100mg  BID and clobazam dose to 30mg  BID last July after his mother called to report 5-10 seizures a day lasting 45 seconds at a time, he had staples and a black eye. He is also on lamotrigine 200mg  TID, Pregabalin 225mg  TID, Levetiracetam 2000mg  BID. He denies any side effects to medications. He has severe sleep apnea but is unable to tolerate CPAP. He denies any drowsiness, however his father reports daytime drowsiness. He denies any headaches, dizziness, vision changes, focal numbness/tingling/weakness. He has noticed that when his friend does acupuncture, the seizures seem to quiet down.   Seizure History: This is a 44 yo RH man with a history of seizures since age 15. He has no recollection of events, no prior warning, witnessed by his mother to have a generalized convulsion lasting 90 to 120 seconds. He was brought to [facility] then had another convulsion 2 days later. They recall trying different medications, Depakote caused liver dysfunction, he has failed Dilantin and Zonegran. He has been on Keppra, Lamictal, Lyrica, and most recently Vimpat. He had an EMU admission at [facility], records unavailable for review, per notes he stayed  for 36 hours and had generalized seizures, multiple foci. They report two admissions for status epilepticus, one in September 2002 in the setting of weaning off Depakote, and another in January 2003. Records unavailable for review. His last GTC was around 5 years ago. He continued to have "petit mals" several times a day where his eyes would roll back, hands would shake for 30-45 seconds, if standing he would fall and had required sutures and staples in the past. He had been having the "petit mals" several times daily until 3 weeks ago when he started having a different type of episode and the petit mals "completely stopped." He was brought to the ER on 12/07/13 when his parents awoke to him dry having and speaking gibberish. He would be unable to speak, control his limbs, and cannot stand up without assistance. He can hear people around him but his jaw feels tight. The episodes can last for several hours. He has had 4 episodes in the past 3 weeks. The patient reports that they have been occurring only during the weekends, however his parents remind him he had one on Wednesday. He went to his neurologist's office on 09/10 where he had an episode while walking to the exam room where he became dizzy and fell. His father was able to catch him, and they reported his speech became affected. He was noted to be speaking in a whisper throughout the visit. He does note that he becomes more dizzy after taking his medications. Lamictal level was 17.8, Vimpat level 7.3.   The patient lives with  his parents and brother. He is on disability and mostly stays at home playing video games. He graduated high school, no special ed classes. He endorses olfactory hallucinations but cannot describe it except saying they are the same all the time.   Epilepsy Risk Factors: He had a skull fracture on the right side after a fall at 40 months of age. Otherwise he had a normal birth and early development. There is no history of  febrile convulsions, CNS infections such as meningitis/encephalitis, neurosurgical procedures, or family history of seizures.  Prior AEDs: Depakote, Dilantin, Zonegran  EEGs: 48-hour EEG (03/17/14 to 03/19/14) abnormal with multifocal discharges and right temporal slowing. Prior records note "generalized seizures, multiple foci" He had a 72-hour EEG (09/26/16 to 09/29/16) which was abnormal with bursts and runs of focal slowing over the right temporal region, bursts and runs of diffuse rhythmic slowing with intermixed generalized spikes lasting up to 120 seconds without clinical correlate, multifocal epileptiform discharges seen over the right temporal, left frontal regions, as well as generalized spikes and polyspikes. During sleep, there were bursts of generalized fast activity. There were 2 clinicoelectrographic seizures captured with right head turn and some hypermotor activity that were non-lateralizing on EEG with diffuse background suppression at seizure onset.  MRI: I personally reviewed MRI brain with and without contrast done 12/26/2013 which did not show any acute intracranial abnormality, hippocampi symmetric without abnormal signal or enhancement.  He has had an extensive evaluation at [facility] in 2019 with repeat MRI brain 07/2017 showing no clear abnormality, PET scan with questionable decreased uptake on the left, Neuropsych testing suggesting more right-sided dysfunction. He underwent sEEG which was non-lateralizing, broad activity at seizure onset with low voltage fast activity from left and right, insular origin also suspicious. He had DBS placed last 10/2017 for non-resective stimulation treatment.    PAST MEDICAL HISTORY: Past Medical History:  Diagnosis Date  . History of kidney stones   . Seizures (HCC)    daily    MEDICATIONS: Current Outpatient Medications on File Prior to Visit  Medication Sig Dispense Refill  . cloBAZam (ONFI) 10 MG tablet TAKE 3 TABLETS BY MOUTH 2 TIMES  DAILY 180 tablet 5  . lacosamide (VIMPAT) 50 MG TABS tablet TAKE 2 TABLETS BY MOUTH 2 TIMES DAILY 120 tablet 5  . lamoTRIgine (LAMICTAL) 100 MG tablet Take 2 tablets three times a day 180 tablet 11  . levETIRAcetam (KEPPRA) 1000 MG tablet TAKE 2 TABLETS BY MOUTH 2 TIMES DAILY 120 tablet 3  . pregabalin (LYRICA) 225 MG capsule TAKE 1 CAPSULE BY MOUTH 3 TIMES DAILY 270 capsule 3   No current facility-administered medications on file prior to visit.    ALLERGIES: Allergies  Allergen Reactions  . Penicillins Rash    Has patient had a PCN reaction causing immediate rash, facial/tongue/throat swelling, SOB or lightheadedness with hypotension: No Has patient had a PCN reaction causing severe rash involving mucus membranes or skin necrosis: No Has patient had a PCN reaction that required hospitalization No Has patient had a PCN reaction occurring within the last 10 years: No If all of the above answers are "NO", then may proceed with Cephalosporin use.     FAMILY HISTORY: Family History  Problem Relation Age of Onset  . Healthy Mother   . Healthy Father     SOCIAL HISTORY: Social History   Socioeconomic History  . Marital status: Single    Spouse name: Not on file  . Number of children: 0  . Years  of education: HS  . Highest education level: Not on file  Occupational History  . Not on file  Tobacco Use  . Smoking status: Former Smoker    Packs/day: 0.50    Years: 4.00    Pack years: 2.00    Types: Cigarettes    Quit date: 03/03/1995    Years since quitting: 24.2  . Smokeless tobacco: Never Used  Substance and Sexual Activity  . Alcohol use: No    Alcohol/week: 0.0 standard drinks  . Drug use: No  . Sexual activity: Not on file  Other Topics Concern  . Not on file  Social History Narrative   Patient is single and lives with his parents.   Patient has a high school education.   Patient is right-handed.   Patient does not have any children.   Patient is on disability.    Patient drinks three sodas daily.   Social Determinants of Health   Financial Resource Strain:   . Difficulty of Paying Living Expenses: Not on file  Food Insecurity:   . Worried About Charity fundraiser in the Last Year: Not on file  . Ran Out of Food in the Last Year: Not on file  Transportation Needs:   . Lack of Transportation (Medical): Not on file  . Lack of Transportation (Non-Medical): Not on file  Physical Activity:   . Days of Exercise per Week: Not on file  . Minutes of Exercise per Session: Not on file  Stress:   . Feeling of Stress : Not on file  Social Connections:   . Frequency of Communication with Friends and Family: Not on file  . Frequency of Social Gatherings with Friends and Family: Not on file  . Attends Religious Services: Not on file  . Active Member of Clubs or Organizations: Not on file  . Attends Archivist Meetings: Not on file  . Marital Status: Not on file  Intimate Partner Violence:   . Fear of Current or Ex-Partner: Not on file  . Emotionally Abused: Not on file  . Physically Abused: Not on file  . Sexually Abused: Not on file    REVIEW OF SYSTEMS: Constitutional: No fevers, chills, or sweats, no generalized fatigue, change in appetite Eyes: No visual changes, double vision, eye pain Ear, nose and throat: No hearing loss, ear pain, nasal congestion, sore throat Cardiovascular: No chest pain, palpitations Respiratory:  No shortness of breath at rest or with exertion, wheezes GastrointestinaI: No nausea, vomiting, diarrhea, abdominal pain, fecal incontinence Genitourinary:  No dysuria, urinary retention or frequency Musculoskeletal:  No neck pain, back pain Integumentary: No rash, pruritus, skin lesions Neurological: as above Psychiatric: No depression, insomnia, anxiety Endocrine: No palpitations, fatigue, diaphoresis, mood swings, change in appetite, change in weight, increased thirst Hematologic/Lymphatic:  No anemia, purpura,  petechiae. Allergic/Immunologic: no itchy/runny eyes, nasal congestion, recent allergic reactions, rashes  PHYSICAL EXAM: Vitals:   05/17/19 1326  BP: 139/82  Pulse: 88  SpO2: 94%   General: No acute distress Head:  Normocephalic/atraumatic Skin/Extremities: No rash, no edema Neurological Exam: alert and oriented to person, place, and time. No aphasia or dysarthria. Fund of knowledge is appropriate.  Recent and remote memory are intact.  Attention and concentration are normal.   Cranial nerves: Pupils equal, round. Extraocular movements intact with no nystagmus. Visual fields full. No facial asymmetry. Motor: Bulk and tone normal, muscle strength 5/5 throughout with no pronator drift. Finger to nose testing intact.  Gait narrow-based and steady  VNS Therapy Management: Parameters Output Current (mA): 2 Signal Frequency (Hz): 20 Pulse Width (usec): 250 Signal ON Time (sec): 30 Signal OFF Time (min): 5 Magnet Output Current (mA): 2.25 Magnet ON Time (sec): 60 Magnet Pulse Width (usec): 500 AutoStim Output Current (mA): 2.25 AutoStim Pulse Width (usec): 250 AutoStim ON Time (sec): 60 Diagnostics Current Delievered (mA): 2 Impedence Value (Ohms): 2240 Battery Status Indicator (color): Green(25-50%)   IMPRESSION: This is a 44 yo RH man with intractable multifocal epilepsy. Family reports seizures started at age 72. He continues to have multiple daily seizures on 5 AEDs, s/p VNS (2016), and DBS (2019) placement. He continues to have near daily seizures on current regimen. We discussed consideration for starting Epidiolex or Xcopri. We will need to wean off one of his other medications as he is on multiple AEDs. VNS interrogated today, battery 25-50%, no changes made. He has an appointment with Dr. Laveda Norman next week for DBS follow-up. For now continue Keppra 2000mg  BID, Onfi 30mg  BID, Lamictal 200mg  TID, Lyrica 225mg  TID, Vimpat 100mg  BID. Baseline safety labs will be ordered prior to  starting new AED. He does not drive. He will follow-up in 3 months, they know to call for any changes.   Thank you for allowing me to participate in his care.  Please do not hesitate to call for any questions or concerns.   , M.D.   CC: Dr. 

## 2019-05-17 NOTE — Patient Instructions (Signed)
1. Bloodwork for CBC, CMP 2. Continue all your medications. Let's plan for starting Epidiolex. 3. Follow-up with Dr. Laveda Norman as schedule 4. Follow-up with me in 3 months, call for any changes

## 2019-05-18 LAB — COMPREHENSIVE METABOLIC PANEL
AG Ratio: 1.8 (calc) (ref 1.0–2.5)
ALT: 26 U/L (ref 9–46)
AST: 18 U/L (ref 10–40)
Albumin: 4.5 g/dL (ref 3.6–5.1)
Alkaline phosphatase (APISO): 96 U/L (ref 36–130)
BUN: 12 mg/dL (ref 7–25)
CO2: 29 mmol/L (ref 20–32)
Calcium: 9.3 mg/dL (ref 8.6–10.3)
Chloride: 105 mmol/L (ref 98–110)
Creat: 0.99 mg/dL (ref 0.60–1.35)
Globulin: 2.5 g/dL (calc) (ref 1.9–3.7)
Glucose, Bld: 105 mg/dL — ABNORMAL HIGH (ref 65–99)
Potassium: 4.3 mmol/L (ref 3.5–5.3)
Sodium: 143 mmol/L (ref 135–146)
Total Bilirubin: 0.5 mg/dL (ref 0.2–1.2)
Total Protein: 7 g/dL (ref 6.1–8.1)

## 2019-05-18 LAB — CBC
HCT: 44.1 % (ref 38.5–50.0)
Hemoglobin: 15 g/dL (ref 13.2–17.1)
MCH: 31.8 pg (ref 27.0–33.0)
MCHC: 34 g/dL (ref 32.0–36.0)
MCV: 93.6 fL (ref 80.0–100.0)
MPV: 10.6 fL (ref 7.5–12.5)
Platelets: 331 10*3/uL (ref 140–400)
RBC: 4.71 10*6/uL (ref 4.20–5.80)
RDW: 12 % (ref 11.0–15.0)
WBC: 6.9 10*3/uL (ref 3.8–10.8)

## 2019-05-21 DIAGNOSIS — G40219 Localization-related (focal) (partial) symptomatic epilepsy and epileptic syndromes with complex partial seizures, intractable, without status epilepticus: Secondary | ICD-10-CM | POA: Diagnosis not present

## 2019-05-28 ENCOUNTER — Telehealth: Payer: Self-pay | Admitting: Internal Medicine

## 2019-05-28 NOTE — Telephone Encounter (Signed)
Called and spoke with Boneta Lucks, [employer]. Patient is needing follow up visit for compliance with bipap. ATC patient at listed mobile number, left message to call back. Listed home number is not in service.

## 2019-05-29 NOTE — Telephone Encounter (Signed)
lmtcb for pt. Pt needs OV.  °

## 2019-05-30 ENCOUNTER — Telehealth: Payer: Self-pay | Admitting: Internal Medicine

## 2019-05-30 ENCOUNTER — Encounter: Payer: Self-pay | Admitting: Emergency Medicine

## 2019-05-30 NOTE — Telephone Encounter (Signed)
LMTCB for pt. Due to several unsuccessful attempts to reach pt a letter has been sent to his home address. Will sign off on encounter.

## 2019-05-30 NOTE — Telephone Encounter (Signed)
Called and spoke with Steward Drone (patient mother) she stated the CPAP machine was returned to [facility] months ago. And if she an convince him to use the machine they will try again.  I called [facility] [phone]) spoke with Steward Drone who confirmed it was returned 03/01/19.  Nothing further needed at this time.

## 2019-06-26 ENCOUNTER — Other Ambulatory Visit: Payer: Self-pay | Admitting: Neurology

## 2019-06-26 NOTE — Telephone Encounter (Signed)
Patient mother wants to know if we can call patient in some eye drops his eye were matted shut this morning when he got up   They use [facility]

## 2019-06-26 NOTE — Telephone Encounter (Signed)
Spoke with pt mother, advises to call pt PCP for eye drops for Mr Mapel

## 2019-06-26 NOTE — Telephone Encounter (Signed)
Pt mother called back no answer voice mail left for her to call office

## 2019-07-31 ENCOUNTER — Telehealth: Payer: Self-pay | Admitting: Neurology

## 2019-07-31 MED ORDER — VIMPAT 100 MG PO TABS: ORAL_TABLET | ORAL | 5 refills | Status: DC

## 2019-07-31 NOTE — Telephone Encounter (Signed)
Pls have his mother increase Vimpat to 150mg  BID (I will send Rx for Vimpat 100mg  1.5 tabs BID) for now, try to keep better sleep schedule, and we will discuss adding on a new medication on his f/u in 2 weeks. Thanks

## 2019-07-31 NOTE — Telephone Encounter (Signed)
Spoke with pt mother increase Vimpat to 150mg  BID (I will send Rx for Vimpat 100mg  1.5 tabs BID) for now, try to keep better sleep schedule, and we will discuss adding on a new medication on his f/u in 2 weeks. Verbalized understanding, pt mother stated at she had just picked up a script for Vimpat 100mg  she would give him 1.5 of them

## 2019-07-31 NOTE — Telephone Encounter (Signed)
Patient's mother called in and stated she left a message for a call back on Monday, but had not heard anything. She wants to speak with Dr. Karel Jarvis about some recent seizure activity the patient has been experiencing.

## 2019-07-31 NOTE — Telephone Encounter (Signed)
Spoke with pt mother, she stated that normally pt has up to 20 "petty mild seizures a day" over the weekend he had 2 that where almost grand mal ( whole body shaking, drooling, bit tongue)  Pt has not missed any medication The night before the 2 big seizures pt told his mother that he didn't sleep good the night before. They wanted to let you know and see if any changes or recommendations needed

## 2019-07-31 NOTE — Telephone Encounter (Signed)
Pt mother called no answer voice mail left for pt mother Mrs Mucci to call back

## 2019-08-14 ENCOUNTER — Other Ambulatory Visit: Payer: Self-pay | Admitting: Neurology

## 2019-08-16 ENCOUNTER — Encounter: Payer: Self-pay | Admitting: Neurology

## 2019-08-16 ENCOUNTER — Other Ambulatory Visit: Payer: Self-pay

## 2019-08-16 ENCOUNTER — Ambulatory Visit (INDEPENDENT_AMBULATORY_CARE_PROVIDER_SITE_OTHER): Payer: PPO | Admitting: Neurology

## 2019-08-16 ENCOUNTER — Telehealth: Payer: Self-pay | Admitting: Neurology

## 2019-08-16 DIAGNOSIS — G40211 Localization-related (focal) (partial) symptomatic epilepsy and epileptic syndromes with complex partial seizures, intractable, with status epilepticus: Secondary | ICD-10-CM

## 2019-08-16 MED ORDER — PREGABALIN 225 MG PO CAPS: 225.0000 mg | ORAL_CAPSULE | Freq: Three times a day (TID) | ORAL | 3 refills | Status: DC

## 2019-08-16 MED ORDER — EPIDIOLEX 100 MG/ML PO SOLN: ORAL | 3 refills | Status: DC

## 2019-08-16 MED ORDER — VIMPAT 100 MG PO TABS: ORAL_TABLET | ORAL | 5 refills | Status: DC

## 2019-08-16 MED ORDER — LAMOTRIGINE 100 MG PO TABS: ORAL_TABLET | ORAL | 11 refills | Status: DC

## 2019-08-16 MED ORDER — CLOBAZAM 10 MG PO TABS: ORAL_TABLET | ORAL | 5 refills | Status: DC

## 2019-08-16 MED ORDER — LEVETIRACETAM 1000 MG PO TABS: 2000.0000 mg | ORAL_TABLET | Freq: Two times a day (BID) | ORAL | 3 refills | Status: DC

## 2019-08-16 NOTE — Telephone Encounter (Signed)
Patients mother called stating that Dr. Karel Jarvis said that she was going to call in a refill for all of his medications but only the Vimpat and Keppra were filled. Also a new medication wasn't filled. Please call.

## 2019-08-16 NOTE — Progress Notes (Signed)
NEUROLOGY FOLLOW UP OFFICE NOTE  Gregg Young [id number] 1976-01-18  HISTORY OF PRESENT ILLNESS: I had the pleasure of seeing Gregg Young in follow-up in the neurology clinic on 08/16/2019.  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 He is again accompanied by his mother who helps supplement the history today.  Records and images were personally reviewed where available.  Since his last visit, he has been to Dr. Rona Ravens at [facility] where DBS was adjusted to increase pulse width. He also has a VNS. His mother continues to report 2 to 10 seizures daily. She called our office last 4/28 to report 2 convulsions with whole body shaking, drooling, tongue bite, which she has not seen in many years. He had not missed any medications, however they both report that he did not sleep well the night before. He has daytime drowsiness due to OSA but cannot tolerate CPAP. He is on 5 AEDs, Vimpat dose was increased to 150mg  BID 2 weeks ago, Lamotrigine 200mg  TID, Pregabalin 225mg  TID, Levetiracetam 2000mg  BID, and clobazam 30mg  BID. He denies any dizziness with increase in Vimpat. No headaches, diplopia. He is happy to report that he has lost 10 lbs with the Nutrisystem diet. His mother reports he has difficulty exercising.  Seizure History: This is a 44 yo RH man with a history of seizures since age 67. He has no recollection of events, no prior warning, witnessed by his mother to have a generalized convulsion lasting 90 to 120 seconds. He was brought to [facility] then had another convulsion 2 days later. They recall trying different medications, Depakote caused liver dysfunction, he has failed Dilantin and Zonegran. He has been on Keppra, Lamictal, Lyrica, and most recently Vimpat. He had an EMU admission at [facility], records unavailable for review, per notes he stayed for 36 hours and had generalized seizures, multiple foci. They report two admissions for status epilepticus, one in September  2002 in the setting of weaning off Depakote, and another in January 2003. Records unavailable for review. His last GTC was around 5 years ago. He continued to have "petit mals" several times a day where his eyes would roll back, hands would shake for 30-45 seconds, if standing he would fall and had required sutures and staples in the past. He had been having the "petit mals" several times daily until 3 weeks ago when he started having a different type of episode and the petit mals "completely stopped." He was brought to the ER on 12/07/13 when his parents awoke to him dry having and speaking gibberish. He would be unable to speak, control his limbs, and cannot stand up without assistance. He can hear people around him but his jaw feels tight. The episodes can last for several hours. He has had 4 episodes in the past 3 weeks. The patient reports that they have been occurring only during the weekends, however his parents remind him he had one on Wednesday. He went to his neurologist's office on 09/10 where he had an episode while walking to the exam room where he became dizzy and fell. His father was able to catch him, and they reported his speech became affected. He was noted to be speaking in a whisper throughout the visit. He does note that he becomes more dizzy after taking his medications. Lamictal level was 17.8, Vimpat level 7.3.   The patient lives with his parents and brother. He is on disability and mostly stays at home playing video  games. He graduated high school, no special ed classes. He endorses olfactory hallucinations but cannot describe it except saying they are the same all the time.   Epilepsy Risk Factors: He had a skull fracture on the right side after a fall at 87 months of age. Otherwise he had a normal birth and early development. There is no history of febrile convulsions, CNS infections such as meningitis/encephalitis, neurosurgical procedures, or family history of  seizures.  Prior AEDs: Depakote, Dilantin, Zonegran  EEGs: 48-hour EEG (03/17/14 to 03/19/14) abnormal with multifocal discharges and right temporal slowing. Prior records note "generalized seizures, multiple foci" He had a 72-hour EEG (09/26/16 to 09/29/16) which was abnormal with bursts and runs of focal slowing over the right temporal region, bursts and runs of diffuse rhythmic slowing with intermixed generalized spikes lasting up to 120 seconds without clinical correlate, multifocal epileptiform discharges seen over the right temporal, left frontal regions, as well as generalized spikes and polyspikes. During sleep, there were bursts of generalized fast activity. There were 2 clinicoelectrographic seizures captured with right head turn and some hypermotor activity that were non-lateralizing on EEG with diffuse background suppression at seizure onset.  MRI: I personally reviewed MRI brain with and without contrast done 12/26/2013 which did not show any acute intracranial abnormality, hippocampi symmetric without abnormal signal or enhancement.  He has had an extensive evaluation at [facility] in 2019 with repeat MRI brain 07/2017 showing no clear abnormality, PET scan with questionable decreased uptake on the left, Neuropsych testing suggesting more right-sided dysfunction. He underwent sEEG which was non-lateralizing, broad activity at seizure onset with low voltage fast activity from left and right, insular origin also suspicious. He had DBS placed last 10/2017 for non-resective stimulation treatment.    PAST MEDICAL HISTORY: Past Medical History:  Diagnosis Date  . History of kidney stones   . Seizures (HCC)    daily    MEDICATIONS: Current Outpatient Medications on File Prior to Visit  Medication Sig Dispense Refill  . lamoTRIgine (LAMICTAL) 100 MG tablet TAKE 2 TABLETS BY MOUTH 3 TIMES DAILY 180 tablet 11  . pregabalin (LYRICA) 225 MG capsule TAKE 1 CAPSULE BY MOUTH 3 TIMES DAILY 270 capsule 3   . cloBAZam (ONFI) 10 MG tablet TAKE 3 TABLETS BY MOUTH 2 TIMES DAILY 180 tablet 5  . Lacosamide (VIMPAT) 100 MG TABS Take 1.5 tabs twice a day 90 tablet 5  . levETIRAcetam (KEPPRA) 1000 MG tablet TAKE 2 TABLETS BY MOUTH 2 TIMES DAILY 120 tablet 3   No current facility-administered medications on file prior to visit.    ALLERGIES: Allergies  Allergen Reactions  . Penicillins Rash    Has patient had a PCN reaction causing immediate rash, facial/tongue/throat swelling, SOB or lightheadedness with hypotension: No Has patient had a PCN reaction causing severe rash involving mucus membranes or skin necrosis: No Has patient had a PCN reaction that required hospitalization No Has patient had a PCN reaction occurring within the last 10 years: No If all of the above answers are "NO", then may proceed with Cephalosporin use.     FAMILY HISTORY: Family History  Problem Relation Age of Onset  . Healthy Mother   . Healthy Father     SOCIAL HISTORY: Social History   Socioeconomic History  . Marital status: Single    Spouse name: Not on file  . Number of children: 0  . Years of education: HS  . Highest education level: Not on file  Occupational History  . Not  on file  Tobacco Use  . Smoking status: Former Smoker    Packs/day: 0.50    Years: 4.00    Pack years: 2.00    Types: Cigarettes    Quit date: 03/03/1995    Years since quitting: 24.4  . Smokeless tobacco: Never Used  Substance and Sexual Activity  . Alcohol use: No    Alcohol/week: 0.0 standard drinks  . Drug use: No  . Sexual activity: Not on file  Other Topics Concern  . Not on file  Social History Narrative   Patient is single and lives with his parents.   Patient has a high school education.   Patient is right-handed.   Patient does not have any children.   Patient is on disability.   Patient drinks three sodas daily.   Social Determinants of Health   Financial Resource Strain:   . Difficulty of Paying  Living Expenses:   Food Insecurity:   . Worried About Programme researcher, broadcasting/film/video in the Last Year:   . Barista in the Last Year:   Transportation Needs:   . Freight forwarder (Medical):   Marland Kitchen Lack of Transportation (Non-Medical):   Physical Activity:   . Days of Exercise per Week:   . Minutes of Exercise per Session:   Stress:   . Feeling of Stress :   Social Connections:   . Frequency of Communication with Friends and Family:   . Frequency of Social Gatherings with Friends and Family:   . Attends Religious Services:   . Active Member of Clubs or Organizations:   . Attends Banker Meetings:   Marland Kitchen Marital Status:   Intimate Partner Violence:   . Fear of Current or Ex-Partner:   . Emotionally Abused:   Marland Kitchen Physically Abused:   . Sexually Abused:     REVIEW OF SYSTEMS: Constitutional: No fevers, chills, or sweats, no generalized fatigue, change in appetite Eyes: No visual changes, double vision, eye pain Ear, nose and throat: No hearing loss, ear pain, nasal congestion, sore throat Cardiovascular: No chest pain, palpitations Respiratory:  No shortness of breath at rest or with exertion, wheezes GastrointestinaI: No nausea, vomiting, diarrhea, abdominal pain, fecal incontinence Genitourinary:  No dysuria, urinary retention or frequency Musculoskeletal:  No neck pain, back pain Integumentary: No rash, pruritus, skin lesions Neurological: as above Psychiatric: No depression, insomnia, anxiety Endocrine: No palpitations, fatigue, diaphoresis, mood swings, change in appetite, change in weight, increased thirst Hematologic/Lymphatic:  No anemia, purpura, petechiae. Allergic/Immunologic: no itchy/runny eyes, nasal congestion, recent allergic reactions, rashes  PHYSICAL EXAM: Vitals:   08/16/19 1435  BP: 140/89  Pulse: 83  SpO2: 96%   General: No acute distress Head:  Normocephalic/atraumatic Skin/Extremities: No rash, no edema Neurological Exam: alert and  oriented to person, place, and time. No aphasia or dysarthria. Fund of knowledge is appropriate.  Recent and remote memory are intact.  Attention and concentration are normal.. Cranial nerves: Pupils equal, round, reactive to light.  Extraocular movements intact with no nystagmus. Visual fields full. No facial asymmetry.  Motor: Bulk and tone normal, muscle strength 5/5 throughout with no pronator drift.  Finger to nose testing intact.  Gait narrow-based and steady, able to tandem walk adequately.   IMPRESSION: This is a 44 yo RH man with intractable multifocal epilepsy. Family reports seizures started at age 22. He continues to have multiple daily seizures on 5 AEDs, s/p VNS (2016), and DBS (2019) placement. He had 2 bigger seizures 2 weeks  ago in the setting of sleep deprivation. We discussed starting Epidiolex, side effects discussed. Reduce clobazam to 20mg  in AM, 30mg  in PM, continue all other AEDs for now, he is on Vimpat 150mg  BID, Levetiracetam 2000mg  BID, Lamotrigine 200mg  TID, and Pregabalin 225mg  TID. Depending on how he does, we hope to wean off Vimpat to streamline medications. LFTs will need to be done at 1, 3, 6 months after starting Epidiolex. He does not drive. He will follow-up in 3 months, they know to call for any changes.   Thank you for allowing me to participate in his care.  Please do not hesitate to call for any questions or concerns.   , M.D.   CC: Dr. , Dr. 

## 2019-08-16 NOTE — Patient Instructions (Signed)
1. Start Epidiolex 19mL twice a day  2. Once you start the Epidiolex, reduce Onfi to 20mg  in AM, 30mg  in PM. Call when you start it so that we can order liver function tests a month after starting Epidiolex  3. Continue all your other medications  4. Follow-up in 3 months, call for any changes  Seizure Precautions: 1. If medication has been prescribed for you to prevent seizures, take it exactly as directed.  Do not stop taking the medicine without talking to your doctor first, even if you have not had a seizure in a long time.   2. Avoid activities in which a seizure would cause danger to yourself or to others.  Don't operate dangerous machinery, swim alone, or climb in high or dangerous places, such as on ladders, roofs, or girders.  Do not drive unless your doctor says you may.  3. If you have any warning that you may have a seizure, lay down in a safe place where you can't hurt yourself.    4.  No driving for 6 months from last seizure, as per [facility].   Please refer to the following link on the Epilepsy Foundation of America's website for more information: http://www.epilepsyfoundation.org/answerplace/Social/driving/drivingu.cfm   5.  Maintain good sleep hygiene.  6.  Contact your doctor if you have any problems that may be related to the medicine you are taking.  7.  Call 911 and bring the patient back to the ED if:        A.  The seizure lasts longer than 5 minutes.       B.  The patient doesn't awaken shortly after the seizure  C.  The patient has new problems such as difficulty seeing, speaking or moving  D.  The patient was injured during the seizure  E.  The patient has a temperature over 102 F (39C)  F.  The patient vomited and now is having trouble breathing

## 2019-08-19 ENCOUNTER — Other Ambulatory Visit: Payer: Self-pay

## 2019-08-19 NOTE — Telephone Encounter (Signed)
Patient's mom called and requested a call back regarding the prescription for Epidiolex 100 MG. She said she was told that Dr. Karel Jarvis will need to order this from the manufacturer directly. Please call her back about this.

## 2019-08-19 NOTE — Telephone Encounter (Signed)
Can you  come see me on this?

## 2019-08-20 DIAGNOSIS — G40219 Localization-related (focal) (partial) symptomatic epilepsy and epileptic syndromes with complex partial seizures, intractable, without status epilepticus: Secondary | ICD-10-CM | POA: Diagnosis not present

## 2019-08-20 NOTE — Telephone Encounter (Signed)
Pt called

## 2019-08-20 NOTE — Telephone Encounter (Signed)
Patients mom has questions about Epidiolex medication that Dr. Karel Jarvis prescribed for her son. Please call.

## 2019-08-20 NOTE — Telephone Encounter (Signed)
Epidiolex is the medication, states you have to order this thru speciality pharmacy, please advise.

## 2019-08-20 NOTE — Telephone Encounter (Signed)
Gregg Young filled out the order form and faxed it to the manufacturer, if she does not hear from them by the end of this week, let us know. Thanks

## 2019-08-22 IMAGING — CT CT HEAD WITHOUT CONTRAST
4 series · 16 of 47 positions shown, 18 images · non-contrast
Comparison: Brain MRI 12/26/2013 and earlier.

CLINICAL DATA: 43-year-old male status post fall 2 days ago.
Seizure. Deep brain stimulator.

EXAM:
CT HEAD WITHOUT CONTRAST
TECHNIQUE: Contiguous axial images were obtained from the base of the skull
through the vertex without intravenous contrast.

[Series 3: head wo · axial · 0.50mm/px · z∈[-89,+46]mm · 7 of 37 slices shown, 9 images]
[im 5/37  brain]
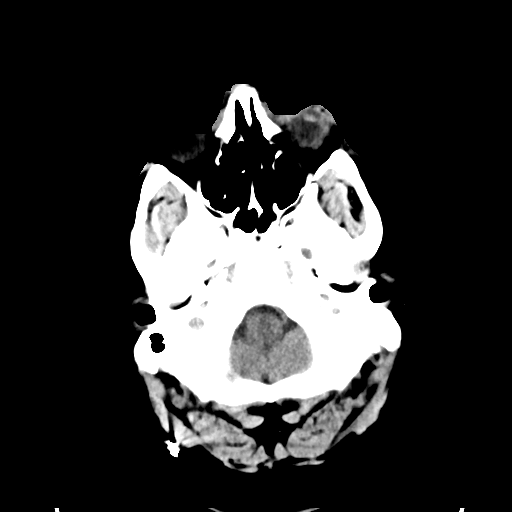
[im 5/37  bone]
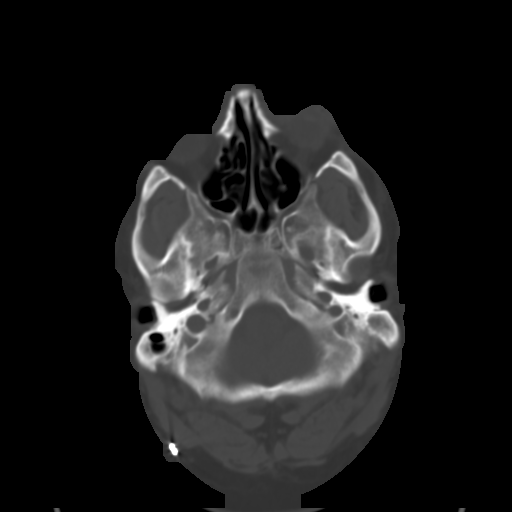
[im 10/37  brain]
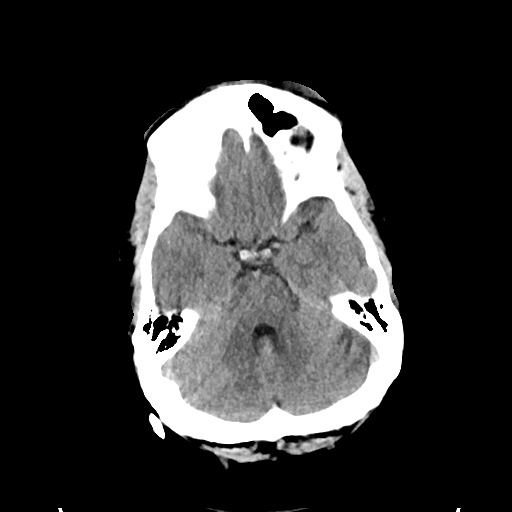
[im 14/37  brain]
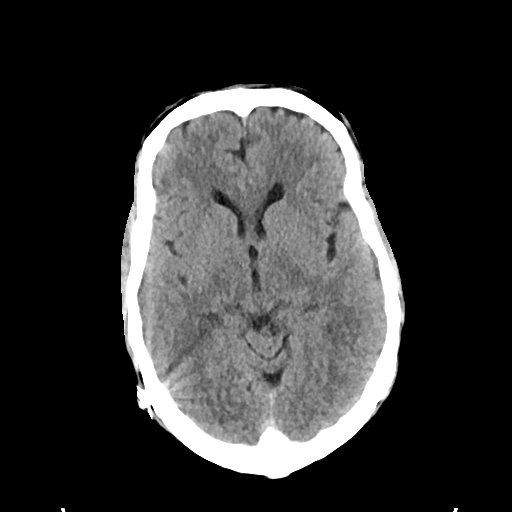
[im 19/37  brain]
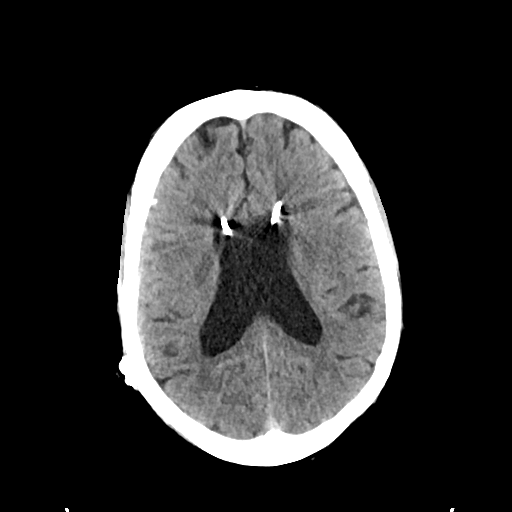
[im 23/37  brain]
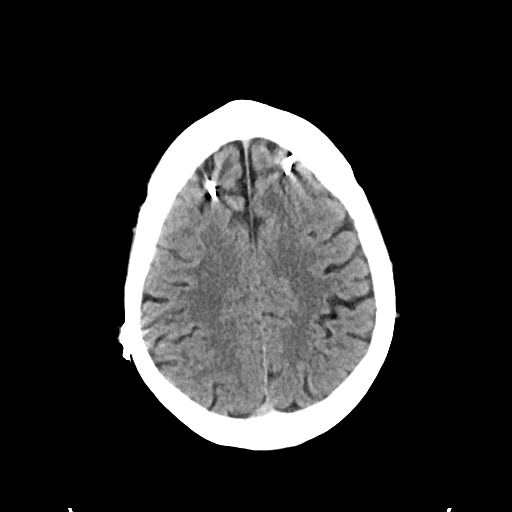
[im 23/37  bone]
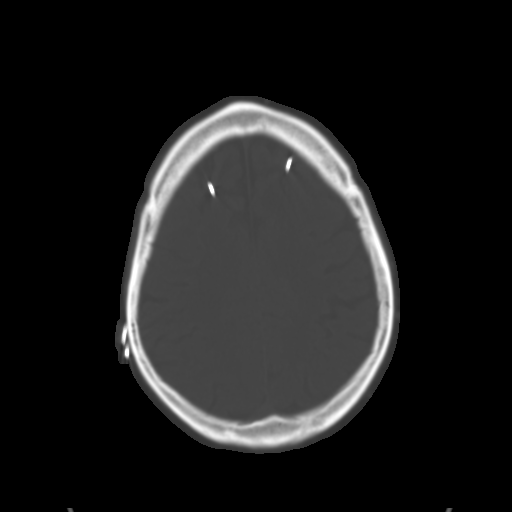
[im 28/37  brain]
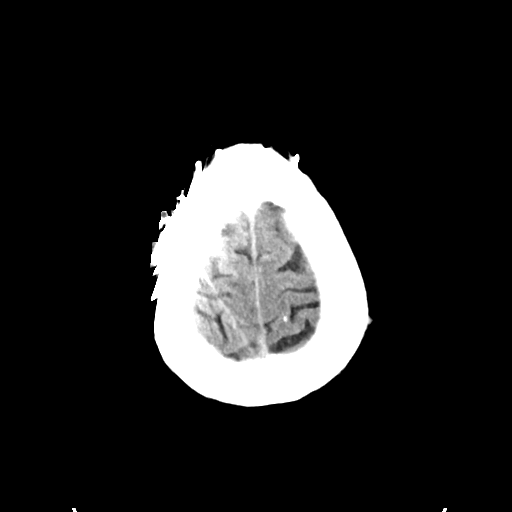
[im 32/37  brain]
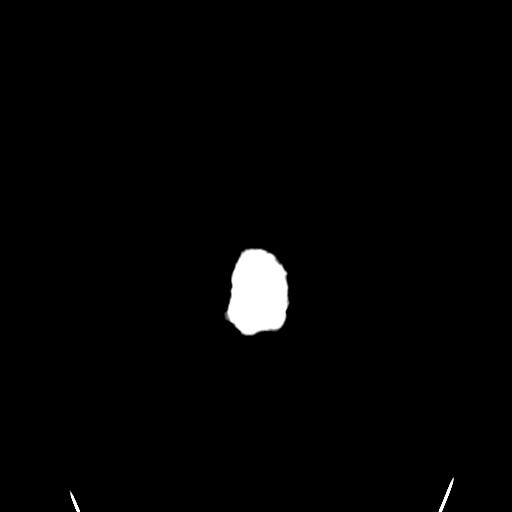

[Series 4: head bone · axial · 0.50mm/px · z∈[-91,-55]mm · 3 of 92 slices shown]
[im 10/92  bone]
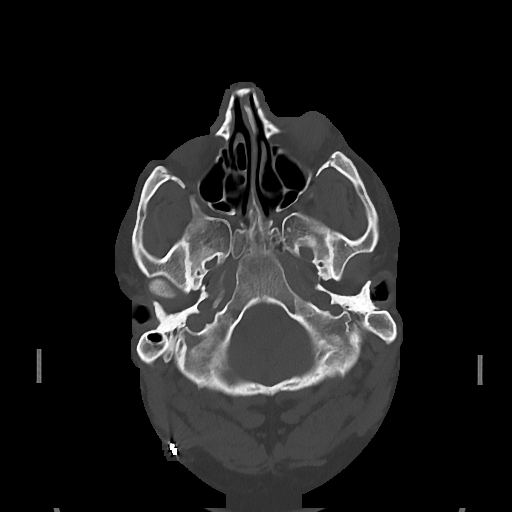
[im 19/92  bone]
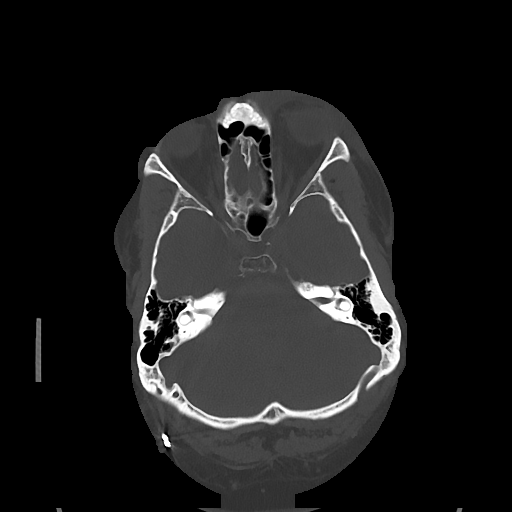
[im 28/92  bone]
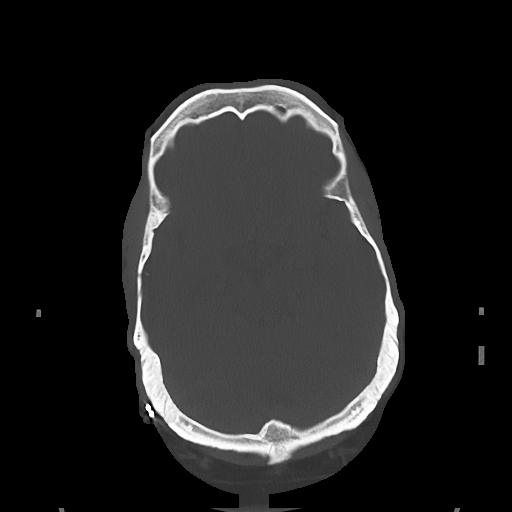

[Series 5: cor soft · coronal · 0.36mm/px · 3 of 75 slices shown]
[im 25/75  brain]
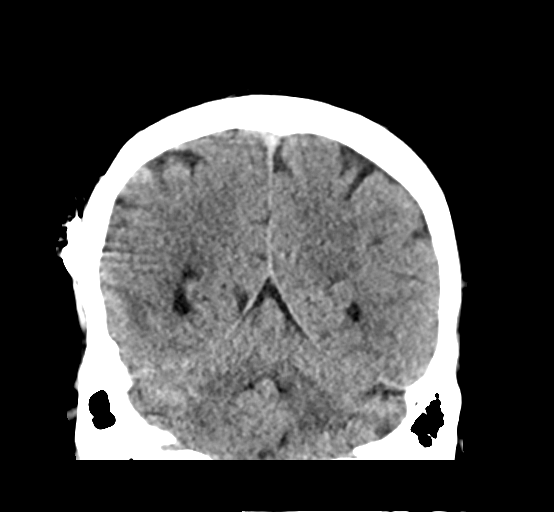
[im 33/75  brain]
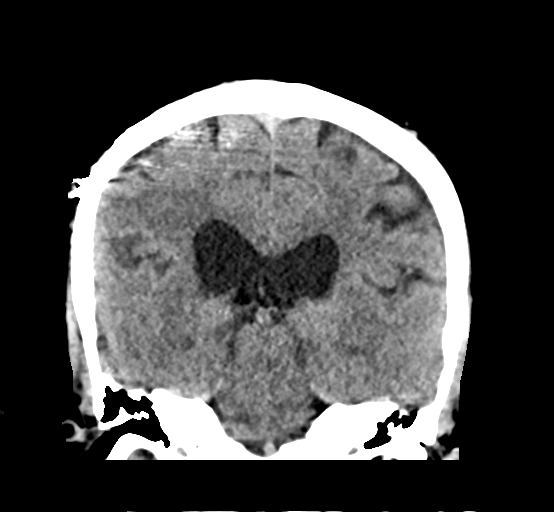
[im 42/75  brain]
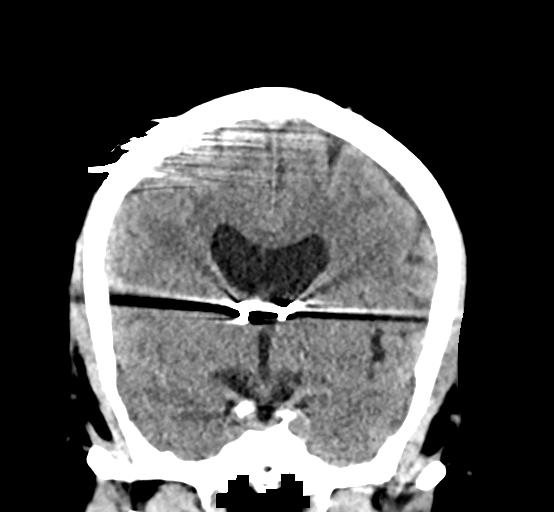

[Series 6: sag soft · sagittal · 0.36mm/px · 3 of 61 slices shown]
[im 21/61  brain]
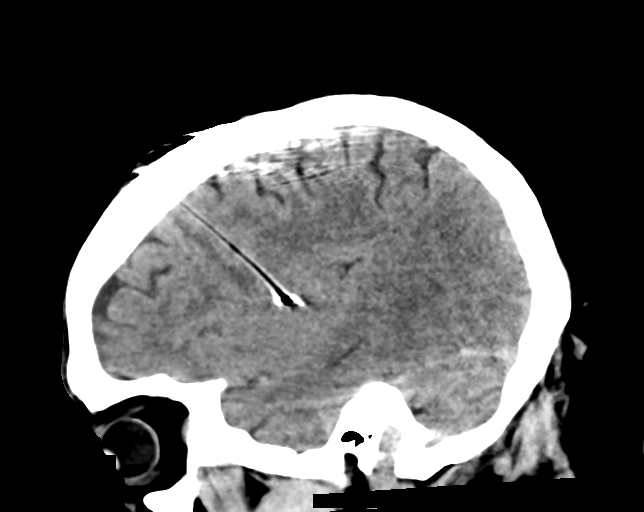
[im 31/61  brain]
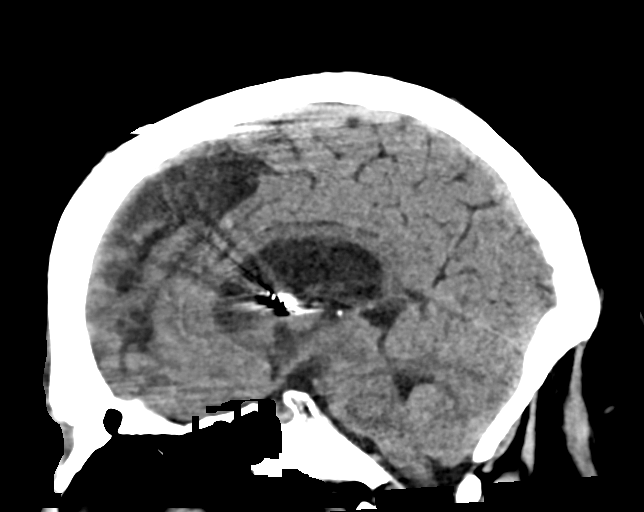
[im 41/61  brain]
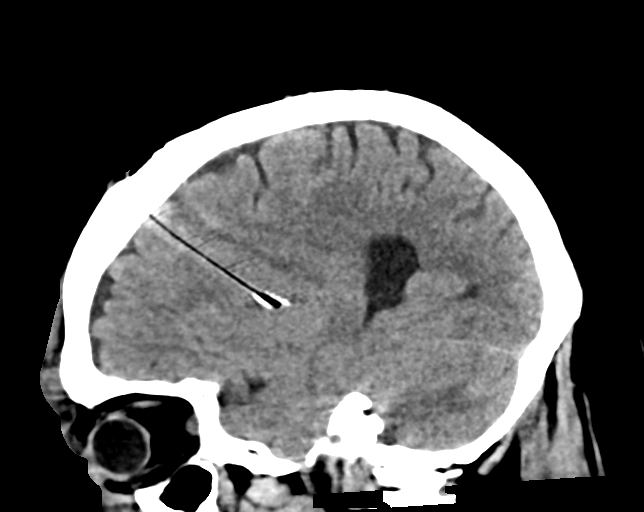

[16 of 47 positions shown; findings below may reference images not displayed]

FINDINGS: Brain: Bilateral vertex approach deep brain stimulator electrodes
with mild streak artifact. No adverse features identified.

Stable cerebral volume since 5460. No midline shift,
ventriculomegaly, mass effect, evidence of mass lesion, intracranial
hemorrhage or evidence of cortically based acute infarction.
Gray-white matter differentiation is within normal limits throughout
the brain.

Vascular: No suspicious intracranial vascular hyperdensity.

Skull: No acute osseous abnormality identified. Vertex burr holes
for deep brain stimulator, probable superimposed sequelae of vertex
fiducial placement.

Sinuses/Orbits: Visualized paranasal sinuses and mastoids are stable
and well pneumatized.

Other: Superficial left periorbital and scalp hematoma measures up
to 14 millimeters in thickness. The left globe appears intact. The
intraorbital soft tissues appear symmetric and normal. No scalp gas
identified.

Superimposed bilateral vertex approach deep brain stimulator
electrodes with wiring tracking along the right lateral convexity
and caudally in the posterior right neck.
IMPRESSION: 1. Superficial left periorbital and scalp hematoma without
underlying fracture identified.
2. No acute intracranial abnormality.
Bilateral vertex approach deep brain stimulator electrodes with no
adverse features and otherwise stable appearance of the brain
compared to a 5460 MRI.

## 2019-08-26 ENCOUNTER — Telehealth: Payer: Self-pay | Admitting: Neurology

## 2019-08-26 ENCOUNTER — Other Ambulatory Visit: Payer: Self-pay

## 2019-08-26 NOTE — Telephone Encounter (Signed)
Pharmacy called to refill Epidiolex 100mg  for patient.

## 2019-08-26 NOTE — Progress Notes (Signed)
epidiolex approved until 08/25/2020 from [employer]  Letter sent to scan

## 2019-08-26 NOTE — Telephone Encounter (Signed)
PA started in cover my meds

## 2019-08-26 NOTE — Progress Notes (Signed)
PA started in cover my meds  Key: [id number] Case ID: [id number]  Waiting for determination

## 2019-08-29 ENCOUNTER — Other Ambulatory Visit: Payer: Self-pay

## 2019-08-29 MED ORDER — EPIDIOLEX 100 MG/ML PO SOLN: ORAL | 3 refills | Status: DC

## 2019-09-18 ENCOUNTER — Telehealth: Payer: Self-pay

## 2019-09-18 ENCOUNTER — Other Ambulatory Visit: Payer: Self-pay

## 2019-09-18 ENCOUNTER — Telehealth: Payer: Self-pay | Admitting: Neurology

## 2019-09-18 DIAGNOSIS — Z79899 Other long term (current) drug therapy: Secondary | ICD-10-CM

## 2019-09-18 NOTE — Telephone Encounter (Signed)
Spoke to pt mother [place], Hantz will come in on Friday June 25th to have his lab work done, labs placed in [employer]

## 2019-09-18 NOTE — Telephone Encounter (Signed)
Left message with the after hour service on 09-18-19 @ 1:00 pm     Caller wants to know when her son is due for lab work again please call

## 2019-09-18 NOTE — Telephone Encounter (Signed)
See prior phone note. 

## 2019-09-27 ENCOUNTER — Other Ambulatory Visit: Payer: Self-pay

## 2019-09-27 ENCOUNTER — Other Ambulatory Visit: Payer: PPO

## 2019-09-27 DIAGNOSIS — Z79899 Other long term (current) drug therapy: Secondary | ICD-10-CM

## 2019-09-27 LAB — HEPATIC FUNCTION PANEL
ALT: 29 U/L (ref 0–53)
AST: 18 U/L (ref 0–37)
Albumin: 4.6 g/dL (ref 3.5–5.2)
Alkaline Phosphatase: 88 U/L (ref 39–117)
Bilirubin, Direct: 0.1 mg/dL (ref 0.0–0.3)
Total Bilirubin: 0.5 mg/dL (ref 0.2–1.2)
Total Protein: 7.2 g/dL (ref 6.0–8.3)

## 2019-09-30 ENCOUNTER — Telehealth: Payer: Self-pay | Admitting: Neurology

## 2019-09-30 NOTE — Telephone Encounter (Signed)
Called pts mother to let her know lab results as outlined by Dr Karel Jarvis - (Pls let mom know liver function is good) Left a VM on Brenda's phone and asked her to call back with an update on how Sevag is doing.

## 2019-09-30 NOTE — Telephone Encounter (Signed)
FYI

## 2019-09-30 NOTE — Telephone Encounter (Signed)
[employer] 09/30/19 @ 12:21pm:  "Caller stated that her son was seizure free since starting his medication, however he had 3 seizures yesterday. Caller declined triage."

## 2019-10-01 ENCOUNTER — Telehealth: Payer: Self-pay

## 2019-10-01 NOTE — Telephone Encounter (Signed)
Noted, thanks!

## 2019-10-01 NOTE — Telephone Encounter (Signed)
Spoke with pts mom who states pt seizure free x3 weeks after starting CBD oil, then had seizure x3 on 09/29/19. Has been taking all medications as ordered. She is unsure how he did yesterday because she was at work. States pt now believes he can live on his own since he went 3 weeks w/o a seizure

## 2019-10-24 ENCOUNTER — Telehealth: Payer: Self-pay | Admitting: Neurology

## 2019-10-24 NOTE — Telephone Encounter (Signed)
Patient mother called and would like to speak to someone about CBD oil. Please call

## 2019-10-24 NOTE — Telephone Encounter (Signed)
Spoke with pts mom who is wanting to know if she can find out what the plan is for further management of pts seizure medications. She states that since starting Epidiolex that pt is sleeping more but has had no seizures and is pleased with the current situation. Her biggest concern is that pt is planning on moving out on his own next month and the last time he did that he ended up in ICU on a ventilator, is unsure whether this was from non-compliance by pt. She states that his next appt is 11/17/19. Told her I would ask Dr Karel Jarvis her question and call her back with updates.

## 2019-10-25 NOTE — Telephone Encounter (Signed)
We can discuss this further on his next visit in 3 weeks, but pls let her know that for now I would stay on the same medications. With the drowsiness, we may reduce the Onfi. With the planned medication changes as we move forward, I think it would still be best he live at home. Thanks

## 2019-10-25 NOTE — Telephone Encounter (Signed)
Spoke with pts mother and told her, per Dr Karel Jarvis, that any medications changes can be discussed at his next visit in 3 weeks. The Onfi dose might need to be decreased d/t drowsiness and with the upcoming plans for medication changes she thinks it would still be best that he live at home. She verbalized understanding and greatly appreciated the feedback, states that "he's 44 and will do what he wants" but feels better with Dr Aquino's feedback to give him.

## 2019-11-20 ENCOUNTER — Encounter: Payer: Self-pay | Admitting: Neurology

## 2019-11-20 ENCOUNTER — Other Ambulatory Visit: Payer: Self-pay

## 2019-11-20 ENCOUNTER — Ambulatory Visit (INDEPENDENT_AMBULATORY_CARE_PROVIDER_SITE_OTHER): Payer: PPO | Admitting: Neurology

## 2019-11-20 VITALS — BP 123/80 | HR 86 | Ht 66.0 in | Wt 263.0 lb

## 2019-11-20 DIAGNOSIS — G40211 Localization-related (focal) (partial) symptomatic epilepsy and epileptic syndromes with complex partial seizures, intractable, with status epilepticus: Secondary | ICD-10-CM | POA: Diagnosis not present

## 2019-11-20 MED ORDER — CLOBAZAM 10 MG PO TABS: ORAL_TABLET | ORAL | 5 refills | Status: DC

## 2019-11-20 NOTE — Progress Notes (Signed)
NEUROLOGY FOLLOW UP OFFICE NOTE  CARI VANDEBERG [id number] 1976-02-09  HISTORY OF PRESENT ILLNESS: I had the pleasure of seeing Irvine Glorioso in follow-up in the neurology clinic on 11/20/2019.  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 He is again accompanied by his mother who helps supplement the history today.  Records and images were personally reviewed where available.  On his last visit, Epidiolex was started. He is currently on 10mL BID (300mg  BID=5mg /kg/day). Clobazam dose reduced to 20mg  in AM, 30mg  in PM. He is also on Vimpat 150mg  BID, Lamotrigine 200mg  TID, Pregabalin 225mg  TID, Levetiracetam 2000mg  BID. He is s/p VNS and DBS placement and follows up at [facility] for the DBS.   Since his last visit, they report a significant reduction in seizures. His mother was previously reporting 2 to 10 seizures daily. His last seizure was 2 weeks ago, he forgot to take his mid-day AED doses, his mother saw 3 brief ones close to together. No associated falls. Prior to this, his last seizure was in June. His mother reports drowsiness likely due to polypharmacy in addition to his untreated sleep apnea. He is drowsy in the office today. He says he slept okay last night but this 10:30am appointment is too early for him. He denies any headaches, dizziness, blurred vision, focal numbness/tingling/weakness. His right upper eyelid appears slightly red and swollen, his mother states he keeps his right eye closed all the time for the past 2 weeks, asking her to put eye drops frequently. His mother is concerned that he is planning to move out and live alone. He has already put a downpayment and apartment will be ready end of next month. He states his friend lives next door and will check on him, he will be 10 minutes away from his parents. He does not drive.   Seizure History: This is a 44 yo RH man with a history of seizures since age 44. He has no recollection of events, no prior  warning, witnessed by his mother to have a generalized convulsion lasting 90 to 120 seconds. He was brought to [facility] then had another convulsion 2 days later. They recall trying different medications, Depakote caused liver dysfunction, he has failed Dilantin and Zonegran. He has been on Keppra, Lamictal, Lyrica, and most recently Vimpat. He had an EMU admission at [facility], records unavailable for review, per notes he stayed for 36 hours and had generalized seizures, multiple foci. They report two admissions for status epilepticus, one in September 2002 in the setting of weaning off Depakote, and another in January 2003. Records unavailable for review. His last GTC was around 5 years ago. He continued to have "petit mals" several times a day where his eyes would roll back, hands would shake for 30-45 seconds, if standing he would fall and had required sutures and staples in the past. He had been having the "petit mals" several times daily until 3 weeks ago when he started having a different type of episode and the petit mals "completely stopped." He was brought to the ER on 12/07/13 when his parents awoke to him dry having and speaking gibberish. He would be unable to speak, control his limbs, and cannot stand up without assistance. He can hear people around him but his jaw feels tight. The episodes can last for several hours. He has had 4 episodes in the past 3 weeks. The patient reports that they have been occurring only during the weekends, however his  parents remind him he had one on Wednesday. He went to his neurologist's office on 09/10 where he had an episode while walking to the exam room where he became dizzy and fell. His father was able to catch him, and they reported his speech became affected. He was noted to be speaking in a whisper throughout the visit. He does note that he becomes more dizzy after taking his medications. Lamictal level was 17.8, Vimpat level 7.3.   The patient lives with  his parents and brother. He is on disability and mostly stays at home playing video games. He graduated high school, no special ed classes. He endorses olfactory hallucinations but cannot describe it except saying they are the same all the time.   Epilepsy Risk Factors: He had a skull fracture on the right side after a fall at 646 months of age. Otherwise he had a normal birth and early development. There is no history of febrile convulsions, CNS infections such as meningitis/encephalitis, neurosurgical procedures, or family history of seizures.  Prior AEDs: Depakote, Dilantin, Zonegran  EEGs: 48-hour EEG (03/17/14 to 03/19/14) abnormal with multifocal discharges and right temporal slowing. Prior records note "generalized seizures, multiple foci" He had a 72-hour EEG (09/26/16 to 09/29/16) which was abnormal with bursts and runs of focal slowing over the right temporal region, bursts and runs of diffuse rhythmic slowing with intermixed generalized spikes lasting up to 120 seconds without clinical correlate, multifocal epileptiform discharges seen over the right temporal, left frontal regions, as well as generalized spikes and polyspikes. During sleep, there were bursts of generalized fast activity. There were 2 clinicoelectrographic seizures captured with right head turn and some hypermotor activity that were non-lateralizing on EEG with diffuse background suppression at seizure onset.  MRI: I personally reviewed MRI brain with and without contrast done 12/26/2013 which did not show any acute intracranial abnormality, hippocampi symmetric without abnormal signal or enhancement.  He has had an extensive [facility] 2019 with repeat MRI brain 07/2017 showing no clear abnormality, PET scan with questionable decreased uptake on the left, Neuropsych testing suggesting more right-sided dysfunction. He underwent sEEG which was non-lateralizing, broad activity at seizure onset with low voltage fast  activity from left and right, insular origin also suspicious. He had DBS placed last 10/2017 for non-resective stimulation treatment.    PAST MEDICAL HISTORY: Past Medical History:  Diagnosis Date  . History of kidney stones   . Seizures (HCC)    daily    MEDICATIONS: Current Outpatient Medications on File Prior to Visit  Medication Sig Dispense Refill  . cannabidiol (EPIDIOLEX) 100 MG/ML solution Take 3mL BID 180 mL 3  . cloBAZam (ONFI) 10 MG tablet Take 2 tabs in AM, 3 tabs in PM 150 tablet 5  . Lacosamide (VIMPAT) 100 MG TABS Take 1.5 tabs twice a day 90 tablet 5  . lamoTRIgine (LAMICTAL) 100 MG tablet TAKE 2 TABLETS BY MOUTH 3 TIMES DAILY 180 tablet 11  . levETIRAcetam (KEPPRA) 1000 MG tablet Take 2 tablets (2,000 mg total) by mouth 2 (two) times daily. 120 tablet 3  . pregabalin (LYRICA) 225 MG capsule Take 1 capsule (225 mg total) by mouth 3 (three) times daily. 270 capsule 3   No current facility-administered medications on file prior to visit.    ALLERGIES: Allergies  Allergen Reactions  . Penicillins Rash    Has patient had a PCN reaction causing immediate rash, facial/tongue/throat swelling, SOB or lightheadedness with hypotension: No Has patient had a PCN reaction  causing severe rash involving mucus membranes or skin necrosis: No Has patient had a PCN reaction that required hospitalization No Has patient had a PCN reaction occurring within the last 10 years: No If all of the above answers are "NO", then may proceed with Cephalosporin use.     FAMILY HISTORY: Family History  Problem Relation Age of Onset  . Healthy Mother   . Healthy Father     SOCIAL HISTORY: Social History   Socioeconomic History  . Marital status: Single    Spouse name: Not on file  . Number of children: 0  . Years of education: HS  . Highest education level: Not on file  Occupational History  . Not on file  Tobacco Use  . Smoking status: Former Smoker    Packs/day: 0.50    Years:  4.00    Pack years: 2.00    Types: Cigarettes    Quit date: 03/03/1995    Years since quitting: 24.7  . Smokeless tobacco: Never Used  Vaping Use  . Vaping Use: Never used  Substance and Sexual Activity  . Alcohol use: No    Alcohol/week: 0.0 standard drinks  . Drug use: No  . Sexual activity: Not on file  Other Topics Concern  . Not on file  Social History Narrative   Patient is single and lives with his parents.   Patient has a high school education.   Patient is right-handed.   Patient does not have any children.   Patient is on disability.   Patient drinks three sodas daily.   Social Determinants of Health   Financial Resource Strain:   . Difficulty of Paying Living Expenses:   Food Insecurity:   . Worried About Programme researcher, broadcasting/film/video in the Last Year:   . Barista in the Last Year:   Transportation Needs:   . Freight forwarder (Medical):   Marland Kitchen Lack of Transportation (Non-Medical):   Physical Activity:   . Days of Exercise per Week:   . Minutes of Exercise per Session:   Stress:   . Feeling of Stress :   Social Connections:   . Frequency of Communication with Friends and Family:   . Frequency of Social Gatherings with Friends and Family:   . Attends Religious Services:   . Active Member of Clubs or Organizations:   . Attends Banker Meetings:   Marland Kitchen Marital Status:   Intimate Partner Violence:   . Fear of Current or Ex-Partner:   . Emotionally Abused:   Marland Kitchen Physically Abused:   . Sexually Abused:     PHYSICAL EXAM: Vitals:   11/20/19 1024  BP: 123/80  Pulse: 86  SpO2: 95%   General: No acute distress, drowsy Head:  Normocephalic/atraumatic. Right upper lid erythema Skin/Extremities: No rash, no edema Neurological Exam: alert and oriented to person, place, and time. No aphasia or dysarthria. Fund of knowledge is appropriate.  Recent and remote memory are intact.  Attention and concentration are normal.   Cranial nerves: Pupils equal,  round. Visual fields full.  No facial asymmetry.  Motor: Bulk and tone normal, muscle strength 5/5 throughout with no pronator drift.  Finger to nose testing intact.  Gait narrow-based and steady, mild difficulty with tandem walk   IMPRESSION: This is a 44 yo RH man with intractable multifocal epilepsy. Family reports seizures started at age 26. He has had an excellent response to Epidiolex, he was having multiple daily seizures, with addition of Epidiolex 67mL  BID (5mg /kg/day), his last seizure was 2 weeks ago. He is drowsy in the office today, mother reports drowsiness, likely combination of polypharmacy and untreated sleep apnea. Reduce Onfi to 10mg  in AM, 30mg  in PM. We plan to continue to streamline medications as he is now on 6 AEDs. Continue Vimpat 150mg  BID, Levetiracetam 2000mg  BID, Lamotrigine 200mg  TID, and Pregabalin 225mg  TID. He wants to live independently, I discussed safety issues especially with medication adjustment. They will call our office if seizures increase with reduction in Onfi, we will plan to increase Epidiolex to 90mL BID at that point. Continue to monitor LFTs. He does not drive. Discuss eyelid redness with eye doctor/PCP. Follow-up in 3 months, they know to call for any changes.    Thank you for allowing me to participate in his care.  Please do not hesitate to call for any questions or concerns.   , M.D.   CC: Dr. 

## 2019-11-20 NOTE — Patient Instructions (Addendum)
1. Reduce Clobazam 10mg : Take 1 tablet in AM, 3 tablets in PM  2. Continue all your other medications  3. If seizures increase, please let me know and we will increase the Epidiolex  4. Have repeat liver function tests done in September  5. See your PCP for your right eye  6. Follow-up in 3-4 months, call for any changes  Seizure Precautions: 1. If medication has been prescribed for you to prevent seizures, take it exactly as directed.  Do not stop taking the medicine without talking to your doctor first, even if you have not had a seizure in a long time.   2. Avoid activities in which a seizure would cause danger to yourself or to others.  Don't operate dangerous machinery, swim alone, or climb in high or dangerous places, such as on ladders, roofs, or girders.  Do not drive unless your doctor says you may.  3. If you have any warning that you may have a seizure, lay down in a safe place where you can't hurt yourself.    4.  No driving for 6 months from last seizure, as per [facility].   Please refer to the following link on the Epilepsy Foundation of America's website for more information: http://www.epilepsyfoundation.org/answerplace/Social/driving/drivingu.cfm   5.  Maintain good sleep hygiene. Avoid alcohol.  6.  Contact your doctor if you have any problems that may be related to the medicine you are taking.  7.  Call 911 and bring the patient back to the ED if:        A.  The seizure lasts longer than 5 minutes.       B.  The patient doesn't awaken shortly after the seizure  C.  The patient has new problems such as difficulty seeing, speaking or moving  D.  The patient was injured during the seizure  E.  The patient has a temperature over 102 F (39C)  F.  The patient vomited and now is having trouble breathing

## 2019-11-22 ENCOUNTER — Other Ambulatory Visit: Payer: Self-pay

## 2019-11-22 ENCOUNTER — Other Ambulatory Visit: Payer: Self-pay | Admitting: Neurology

## 2019-11-22 DIAGNOSIS — G40211 Localization-related (focal) (partial) symptomatic epilepsy and epileptic syndromes with complex partial seizures, intractable, with status epilepticus: Secondary | ICD-10-CM

## 2019-11-22 MED ORDER — EPIDIOLEX 100 MG/ML PO SOLN: ORAL | 3 refills | Status: DC

## 2019-11-22 NOTE — Addendum Note (Signed)
Addended by: Van Clines on: 11/22/2019 01:24 PM   Modules accepted: Orders

## 2019-11-22 NOTE — Telephone Encounter (Signed)
Patient's mother Steward Drone called in and left a message. She stated they do need a new prescription at the [facility] (?) pharmacy.

## 2019-11-22 NOTE — Addendum Note (Signed)
Addended by: Van Clines on: 11/22/2019 01:26 PM   Modules accepted: Orders

## 2019-11-22 NOTE — Telephone Encounter (Signed)
Patient's mom called and said she did confirm the patient needs a new prescription now for Epidiolix 100 MG. He has no refills left at this point.  [facility], mail order specialty

## 2019-12-03 ENCOUNTER — Telehealth: Payer: Self-pay

## 2019-12-03 ENCOUNTER — Telehealth: Payer: Self-pay | Admitting: Neurology

## 2019-12-03 MED ORDER — ONDANSETRON 4 MG PO TBDP: 4.0000 mg | ORAL_TABLET | Freq: Three times a day (TID) | ORAL | 0 refills | Status: DC | PRN

## 2019-12-03 NOTE — Telephone Encounter (Signed)
Pt.'s Mother is requesting that zofran be sent in for the patient. He is vomiting and now unable to keep his seizure medicine down. Pt has epilepsy

## 2019-12-03 NOTE — Telephone Encounter (Signed)
Patient has been throwing up most of the night and they would like to know if we can call in something for him to help with this   They use the [facility]

## 2019-12-03 NOTE — Telephone Encounter (Signed)
Patient's mom called in to check on the status of the request.

## 2019-12-03 NOTE — Telephone Encounter (Signed)
If zofran doesn't work lets get a video visit. Only thing im unsure of is why he is having vomiting issues and may need to investigate further if doesn't resolve

## 2019-12-03 NOTE — Telephone Encounter (Signed)
She will need to call the PCP for this, thanks

## 2019-12-03 NOTE — Telephone Encounter (Signed)
Left message to return call from last visit.

## 2019-12-03 NOTE — Telephone Encounter (Signed)
You have not seen patient in 2 years. Do you want me to work him in for video visit at end of day?

## 2019-12-03 NOTE — Telephone Encounter (Signed)
See below- I sent zofran in

## 2019-12-04 NOTE — Telephone Encounter (Signed)
Returned patients mothers call and informed her that pt needs to contact PCP. Patients mother stated they have reached out and pt is feeling better.

## 2019-12-05 ENCOUNTER — Telehealth: Payer: Self-pay | Admitting: Neurology

## 2019-12-05 DIAGNOSIS — R4182 Altered mental status, unspecified: Secondary | ICD-10-CM

## 2019-12-05 NOTE — Telephone Encounter (Signed)
Patient's mom called to report the patient woke up with "slurred speech and he can't walk." She explained that these are new symptoms so I attempted to get a clinical staff member on the line without success.   Advised to contact EMS or proceed to the emergency room. Patient's mom stated the wait at the ED is "over 9 hours." Routing call as high priority.

## 2019-12-05 NOTE — Telephone Encounter (Signed)
Called patients mother back and informed her to contact EMS right away. I informed her that patient needs to be seen and evaluated. Mother asked if Dr. Karel Jarvis was in, and I informed her that Dr. Karel Jarvis is not, however, she would advised patient to go to ER right away. Patients mother verbalized understanding and will contact EMS.

## 2019-12-10 NOTE — Telephone Encounter (Signed)
Left a message for pt mother to call the office back to follow up on how Gregg Young is doing,

## 2019-12-10 NOTE — Telephone Encounter (Signed)
Can you pls check in on Monish? I don't see any hospital notes, how is he? Thanks!

## 2019-12-11 NOTE — Telephone Encounter (Signed)
Pt mother called no answer will try again later

## 2019-12-12 NOTE — Telephone Encounter (Signed)
Spoke to pt mother she talk to their PCP pt was given Zofran and it helped him, pt is still sleeping a lot, will fall asleep while talking, sometimes he is disoriented like he is after a seizure but he has not had any seizures. Pt still plans to move out on his own and pt mother is worried

## 2019-12-13 NOTE — Addendum Note (Signed)
Addended by: Dimas Chyle on: 12/13/2019 02:30 PM   Modules accepted: Orders

## 2019-12-13 NOTE — Telephone Encounter (Signed)
Pls let her know I would like to check his liver function again (Pls order LFTs and ammonia). Also, pls let her know that in a week, we will plan to reduce his Onfi again to hopefully help with the drowsiness. Thanks

## 2019-12-13 NOTE — Telephone Encounter (Signed)
Spoke to pt mother informed her that dr Karel Jarvis would like to check his liver function again ). Also, t in a week, we will plan to reduce his Onfi again to hopefully help with the drowsiness

## 2019-12-17 ENCOUNTER — Telehealth: Payer: Self-pay | Admitting: Neurology

## 2019-12-17 NOTE — Telephone Encounter (Signed)
Spoke to pt mother he is not having any double vision or difficulty swallowing. Just some vision changes with the droopy eyelids, he has an appointment in [place] and is also on a waiting list to get in sooner,

## 2019-12-17 NOTE — Telephone Encounter (Signed)
Spoke to pt mother informed her that dr Karel Jarvis Recommend seeing eye doctor first to assess for primary eye condition. She stated earliest appointment was in in Jan, I informed her that I would let Dr Karel Jarvis know ,

## 2019-12-17 NOTE — Telephone Encounter (Signed)
Recommend seeing eye doctor first to assess for primary eye condition. Thanks

## 2019-12-17 NOTE — Telephone Encounter (Signed)
Patient's mom called and said, "My son's eyelids are drooping more now. Is that something Dr. Karel Jarvis can help with or would he need to see an opthalmologist?"

## 2019-12-17 NOTE — Telephone Encounter (Signed)
Is he having any other symptoms with the droopy eyelids, is he having any double vision or difficulty swallowing? If none, she can try calling other eye doctor offices.

## 2019-12-24 DIAGNOSIS — G40219 Localization-related (focal) (partial) symptomatic epilepsy and epileptic syndromes with complex partial seizures, intractable, without status epilepticus: Secondary | ICD-10-CM | POA: Diagnosis not present

## 2019-12-31 ENCOUNTER — Other Ambulatory Visit: Payer: Self-pay

## 2019-12-31 ENCOUNTER — Encounter: Payer: Self-pay | Admitting: Family Medicine

## 2019-12-31 ENCOUNTER — Other Ambulatory Visit: Payer: PPO

## 2019-12-31 ENCOUNTER — Ambulatory Visit (INDEPENDENT_AMBULATORY_CARE_PROVIDER_SITE_OTHER): Payer: PPO

## 2019-12-31 DIAGNOSIS — R4182 Altered mental status, unspecified: Secondary | ICD-10-CM | POA: Diagnosis not present

## 2019-12-31 DIAGNOSIS — Z23 Encounter for immunization: Secondary | ICD-10-CM

## 2020-01-01 LAB — HEPATIC FUNCTION PANEL
ALT: 19 IU/L (ref 0–44)
AST: 14 IU/L (ref 0–40)
Albumin: 4.3 g/dL (ref 4.0–5.0)
Alkaline Phosphatase: 89 IU/L (ref 44–121)
Bilirubin Total: <0.2 mg/dL (ref 0.0–1.2)
Bilirubin, Direct: <0.1 mg/dL (ref 0.00–0.40)
Total Protein: 6.9 g/dL (ref 6.0–8.5)

## 2020-01-01 LAB — AMMONIA: Ammonia: 81 ug/dL (ref 40–200)

## 2020-01-02 ENCOUNTER — Telehealth: Payer: Self-pay

## 2020-01-02 NOTE — Telephone Encounter (Signed)
-----   Message from Van Clines, MD sent at 01/01/2020  4:32 PM EDT ----- Pls let mom know bloodwork is normal, thanks!

## 2020-01-02 NOTE — Telephone Encounter (Signed)
Spoke to pt mother informed her that blood work was normal, she asking me to inform Dr Karel Jarvis that Gregg Young has had multiple seizures this week, no change in sleep no increase stress no missed medication Pt c/o: seizure Missed medications?  No. Sleep deprived?  No. Alcohol intake?  No. Back to their usual baseline self?  Yes.  . If no, advise go to ER Current medications prescribed by Dr. Karel Jarvis: Epidiolex, onfi, vimpat, lamictal, keppra   Also [name] is having Surgery on Oct 20th  To replace deep brain stimulator

## 2020-01-02 NOTE — Telephone Encounter (Signed)
Noted, no changes for now, we can see how he does over the next few weeks if this is a one time thing or becoming a recurrent concern again. Thanks

## 2020-01-02 NOTE — Telephone Encounter (Signed)
Spoke with Steward Drone pt mother informed her no changes for now, we can see how he does over the next few weeks if this is a one time thing or becoming a recurrent concern again

## 2020-01-03 HISTORY — PX: OTHER SURGICAL HISTORY: SHX169

## 2020-01-07 ENCOUNTER — Telehealth: Payer: Self-pay | Admitting: Neurology

## 2020-01-07 ENCOUNTER — Telehealth: Payer: Self-pay

## 2020-01-07 DIAGNOSIS — G40211 Localization-related (focal) (partial) symptomatic epilepsy and epileptic syndromes with complex partial seizures, intractable, with status epilepticus: Secondary | ICD-10-CM

## 2020-01-07 NOTE — Telephone Encounter (Signed)
I would increase the Epidiolex instead to 56mL twice a day, so we can hopefully get off Onfi later on. If she is fine with this, a new Rx will be sent, thanks

## 2020-01-07 NOTE — Telephone Encounter (Signed)
Patient's mother Steward Drone called in stating the patient fell twice this past weekend due to seizures. She would like to know what Dr. Karel Jarvis thinks he should go back to his original Onfi dose of 2 in the morning and 3 at night?

## 2020-01-07 NOTE — Telephone Encounter (Signed)
Spoke with pt mother to increase the Epidiolex instead  to 38mL twice a day, so we can hopefully get off Onfi later on. Verbalized understanding, would like a new script sent in.

## 2020-01-08 ENCOUNTER — Other Ambulatory Visit: Payer: Self-pay

## 2020-01-08 DIAGNOSIS — G40211 Localization-related (focal) (partial) symptomatic epilepsy and epileptic syndromes with complex partial seizures, intractable, with status epilepticus: Secondary | ICD-10-CM

## 2020-01-08 MED ORDER — EPIDIOLEX 100 MG/ML PO SOLN: ORAL | 5 refills | Status: DC

## 2020-01-08 NOTE — Telephone Encounter (Signed)
Rx printed, will need to fax to specialty pharmacy, thanks!

## 2020-01-08 NOTE — Addendum Note (Signed)
Addended by: Van Clines on: 01/08/2020 09:39 AM   Modules accepted: Orders

## 2020-01-13 ENCOUNTER — Other Ambulatory Visit: Payer: Self-pay | Admitting: Neurology

## 2020-01-16 DIAGNOSIS — G40419 Other generalized epilepsy and epileptic syndromes, intractable, without status epilepticus: Secondary | ICD-10-CM | POA: Diagnosis not present

## 2020-01-16 DIAGNOSIS — Z4542 Encounter for adjustment and management of neuropacemaker (brain) (peripheral nerve) (spinal cord): Secondary | ICD-10-CM | POA: Diagnosis not present

## 2020-01-16 DIAGNOSIS — G4733 Obstructive sleep apnea (adult) (pediatric): Secondary | ICD-10-CM | POA: Diagnosis not present

## 2020-01-20 ENCOUNTER — Telehealth: Payer: Self-pay

## 2020-01-20 NOTE — Telephone Encounter (Signed)
LVM asking patient to call the office back to get scheduled for an appointment

## 2020-01-20 NOTE — Telephone Encounter (Signed)
Patient's mother is calling in, asking to schedule an appointment with Dr.Hunter to follow up from surgery and to check on the operative sight. Can we schedule in Same Day next week?

## 2020-01-20 NOTE — Telephone Encounter (Signed)
SDA is fine.  

## 2020-01-21 ENCOUNTER — Other Ambulatory Visit: Payer: Self-pay

## 2020-01-21 ENCOUNTER — Telehealth: Payer: Self-pay | Admitting: Neurology

## 2020-01-21 ENCOUNTER — Encounter (HOSPITAL_COMMUNITY): Payer: Self-pay

## 2020-01-21 ENCOUNTER — Ambulatory Visit (HOSPITAL_COMMUNITY)
Admission: EM | Admit: 2020-01-21 | Discharge: 2020-01-21 | Disposition: A | Discharge status: Home / Self Care | Payer: PPO | Attending: Family Medicine | Admitting: Family Medicine

## 2020-01-21 DIAGNOSIS — S01511A Laceration without foreign body of lip, initial encounter: Secondary | ICD-10-CM

## 2020-01-21 DIAGNOSIS — S0993XA Unspecified injury of face, initial encounter: Secondary | ICD-10-CM | POA: Diagnosis not present

## 2020-01-21 NOTE — Telephone Encounter (Signed)
Patient mother called and states that patient has had a seizure today and fell face first into  a concrete wall. She would like a call back

## 2020-01-21 NOTE — ED Triage Notes (Signed)
Pt presents with 2 lip lacerations after falling outside on pavement today after having a seizure: pt states he has about 1 seizure a day  No LOC or significant pain.

## 2020-01-21 NOTE — Telephone Encounter (Signed)
Pls call and see what she needs, how is he doing, does she feel he needs to go to the ER? thanks

## 2020-01-21 NOTE — Discharge Instructions (Signed)
  Please seek prompt medical care if: You have: A very bad (severe) headache that is not helped by medicine. Trouble walking or weakness in your arms and legs. Clear or bloody fluid coming from your nose or ears. Changes in your seeing (vision). Jerky movements that you cannot control (seizure). You throw up (vomit). Your symptoms get worse. You lose balance. Your speech is slurred. You pass out. You are sleepier and have trouble staying awake. The black centers of your eyes (pupils) change in size.  These symptoms may be an emergency. Do not wait to see if the symptoms will go away. Get medical help right away. Call your local emergency services. Do not drive yourself to the hospital.  

## 2020-01-21 NOTE — Telephone Encounter (Signed)
She wanted you to know about what had happened he is doing fine, alert and talking, thinks he may of broken his nose, he had surgery on Thursday at [facility] the site looks good. Just an FYI doesn't think he needs to go to the ER

## 2020-01-22 NOTE — ED Provider Notes (Signed)
[facility]   [id number] 01/21/20 Arrival Time: 1702  ASSESSMENT & PLAN:  1. Facial injury, initial encounter   2. Lip laceration, initial encounter     Discussed suturing lip lac vs letting it heal on its own. Prefers to not suture at this time. Normal neurologic exam. No suspicion for ICH, SAH, or skull fracture. No indication for neurodiagnostic imaging at this time.   Discharge Instructions     Please seek prompt medical care if:  You have: ? A very bad (severe) headache that is not helped by medicine. ? Trouble walking or weakness in your arms and legs. ? Clear or bloody fluid coming from your nose or ears. ? Changes in your seeing (vision). ? Jerky movements that you cannot control (seizure).  You throw up (vomit).  Your symptoms get worse.  You lose balance.  Your speech is slurred.  You pass out.  You are sleepier and have trouble staying awake.  The black centers of your eyes (pupils) change in size.  These symptoms may be an emergency. Do not wait to see if the symptoms will go away. Get medical help right away. Call your local emergency services. Do not drive yourself to the hospital.    Will use OTC analgesics as needed for discomfort.   Reviewed expectations re: course of current medical issues. Questions answered. Outlined signs and symptoms indicating need for more acute intervention. Patient verbalized understanding. After Visit Summary given.  SUBJECTIVE: History from: patient. Patient is able to give a clear and coherent history.   Gregg Young is a 44 y.o. male who has a long h/o daily seizures. Reports seizure today; tripped and fell onto his face with resulting abrasions and upper inner lip laceration; bleeding controlled. No tooth pain. No specific facial pain. No visual changes.  Denies: confusion, difficulty concentrating, dizziness, headache, nausea, ringing in ears, sensitivity to light and noise, slurred speech and  vomiting. History of head injury or concussion: no.  OBJECTIVE:  Vitals:   01/21/20 1802  BP: 108/63  Pulse: 74  Resp: 17  Temp: 98.6 F (37 C)  TempSrc: Oral  SpO2: 93%     GCS: 15 General appearance: alert; no distress HEENT: normocephalic; atraumatic; conjunctivae normal; TMs normal; oral mucosa normal; upper lip with approx 1 cm inner laceration of which edges are well-approximated at rest; no active bleeding; teeth intact Neck: supple with FROM; no midline cervical tenderness or deformity; no anterior mass or crepitus; trachea midline Back: no midline tenderness Extremities: moves all extremities normally; no edema; symmetrical with no gross deformities Skin: warm and dry; facial abrasions over forehead and upper nose Neurologic: speech is fluent and clear without dysarthria or aphasia; normal gait Psychological: alert and cooperative; normal mood and affect   Allergies  Allergen Reactions  . Penicillins Rash    Has patient had a PCN reaction causing immediate rash, facial/tongue/throat swelling, SOB or lightheadedness with hypotension: No Has patient had a PCN reaction causing severe rash involving mucus membranes or skin necrosis: No Has patient had a PCN reaction that required hospitalization No Has patient had a PCN reaction occurring within the last 10 years: No If all of the above answers are "NO", then may proceed with Cephalosporin use.    Past Medical History:  Diagnosis Date  . History of kidney stones   . Seizures (HCC)    daily   Past Surgical History:  Procedure Laterality Date  . HERNIA REPAIR     baby  .  VAGUS NERVE STIMULATOR INSERTION Left 03/10/2015   Procedure: LEFT SIDED PLACEMENT VAGAL NERVE STIMULATOR IMPLANT;  Surgeon: Lisbeth Renshaw, MD;  Location: [facility];  Service: Neurosurgery;  Laterality: Left;   Family History  Problem Relation Age of Onset  . Healthy Mother   . Healthy Father            Mardella Layman,  MD 01/22/20 [phone]

## 2020-01-31 NOTE — Patient Instructions (Addendum)
Health Maintenance Due  Topic Date Due  . Hepatitis C Screening - consider future labs Never done  . COVID-19 Vaccine (1) Will call us back with the dates. He is getting his booster today. Never done  . HIV Screening - consider future labs Never done   Great to see you today. Glad the incision is looking good and lip healing well

## 2020-01-31 NOTE — Progress Notes (Signed)
Phone [phone] In person visit   Subjective:   Gregg Young is a 44 y.o. year old very pleasant male patient who presents for/with See problem oriented charting Chief Complaint  Patient presents with  . Surgery Follow up    This visit occurred during the SARS-CoV-2 public health emergency.  Safety protocols were in place, including screening questions prior to the visit, additional usage of staff PPE, and extensive cleaning of exam room while observing appropriate contact time as indicated for disinfecting solutions.   Past Medical History-  Patient Active Problem List   Diagnosis Date Noted  . 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 Obstructive sleep apnea 05/08/2017    Priority: Medium  . Excessive daytime sleepiness 02/28/2017  . Dizziness and giddiness 07/08/2014  . 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 Partial symptomatic epilepsy (HCC) 07/13/2012    Medications- reviewed and updated Current Outpatient Medications  Medication Sig Dispense Refill  . cannabidiol (EPIDIOLEX) 100 MG/ML solution Take 21mL BID 372 mL 5  . cloBAZam (ONFI) 10 MG tablet Take 1 tab in AM, 3 tabs in PM (Patient taking differently: Take 1 tab in AM, 2 tabs in PM) 120 tablet 5  . Lacosamide (VIMPAT) 100 MG TABS Take 1.5 tabs twice a day 90 tablet 5  . lamoTRIgine (LAMICTAL) 100 MG tablet TAKE 2 TABLETS BY MOUTH 3 TIMES DAILY 180 tablet 11  . levETIRAcetam (KEPPRA) 1000 MG tablet TAKE 2 TABLETS BY MOUTH 2 TIMES DAILY 120 tablet 1  . ondansetron (ZOFRAN ODT) 4 MG disintegrating tablet Take 1 tablet (4 mg total) by mouth every 8 (eight) hours as needed for nausea or vomiting. 20 tablet 0  . pregabalin (LYRICA) 225 MG capsule Take 1 capsule (225 mg total) by mouth 3 (three) times daily. 270 capsule 3   No current facility-administered medications for this visit.     Objective:  BP  126/72   Pulse 68   Temp 98.3 F (36.8 C) (Temporal)   Resp 18   Ht 5\' 6"  (1.676 m)   Wt 258 lb 9.6 oz (117.3 kg)   SpO2 95%   BMI 41.74 kg/m  Gen: NAD, resting comfortably CV: RRR no murmurs rubs or gallops Lungs: CTAB no crackles, wheeze, rhonchi Abdomen: soft/nontender/nondistended/normal bowel sounds. No rebound or guarding.  Ext: no edema Skin: warm, dry, right chest former incision healing well after removal of steri strips     Assessment and Plan  # Surgery Follow Up-deep brain stimulator battery replacement #Seizure history S: Fortunately patient had been doing much better with addition of cannabidiol to his regimen. Also on clobasam 10 mg- 1 in AM, 2 in PM, vimpat 150mg  BID, lamictal 200 mg TID, keppra 2000mg  BID, as well as lyrica 225 mg TID. Recently back to 2-3x a week so not as encouraging but at least down from daily.   Did have recent seizure while walking and fell and [place] related to the seizure. Denies headaches with this trauma A/P: Seizures with improved control but still difficult position with seizures 2-3x a week life limiting. Continue current medicines and [facility] follow up. Removed steri strips from right chest from recent battery replacement- apperas to be healing well  Also examined lip and seems to be healing well- continue to monitor  # covid 19 booster- planned for today. We will get dates logged afterwards  #labwork-  cbc and cmp reassuring in February. Will hold off for now. Offered cholesterol- wants to hold off for now  Recommended follow up: as needed Future Appointments  Date Time Provider Department Center  02/20/2020 12:00 PM Van Clines, MD [facility] None   Lab/Order associations:   ICD-10-CM   1. 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  Tana Conch, MD

## 2020-02-05 ENCOUNTER — Ambulatory Visit (INDEPENDENT_AMBULATORY_CARE_PROVIDER_SITE_OTHER): Payer: PPO | Admitting: Family Medicine

## 2020-02-05 ENCOUNTER — Encounter: Payer: Self-pay | Admitting: Family Medicine

## 2020-02-05 ENCOUNTER — Telehealth: Payer: Self-pay | Admitting: Neurology

## 2020-02-05 ENCOUNTER — Other Ambulatory Visit: Payer: Self-pay

## 2020-02-05 VITALS — BP 126/72 | HR 68 | Temp 98.3°F | Resp 18 | Ht 66.0 in | Wt 258.6 lb

## 2020-02-05 DIAGNOSIS — G40219 Localization-related (focal) (partial) symptomatic epilepsy and epileptic syndromes with complex partial seizures, intractable, without status epilepticus: Secondary | ICD-10-CM | POA: Diagnosis not present

## 2020-02-05 NOTE — Telephone Encounter (Signed)
Patient's mother called in and left a message returning call

## 2020-02-05 NOTE — Telephone Encounter (Signed)
Pt mother states that she needs DR Karel Jarvis to do a letter for her son. She states that the letter needs to states that he is unable to attend a event

## 2020-02-05 NOTE — Telephone Encounter (Signed)
They [place] to [place] its at the [place] in [place] they would like a letter stating that with his seizures its not good for him to travel that far, so they can get their money back. Show is nov 19th

## 2020-02-05 NOTE — Telephone Encounter (Signed)
Left a message for pt mother to call back so we can find out what type of event? And what the letter needs to say

## 2020-02-10 ENCOUNTER — Other Ambulatory Visit: Payer: Self-pay | Admitting: Neurology

## 2020-02-10 ENCOUNTER — Encounter: Payer: Self-pay | Admitting: Neurology

## 2020-02-10 NOTE — Telephone Encounter (Signed)
Patient's mom called to see if the letter is ready for pick up yet.

## 2020-02-10 NOTE — Telephone Encounter (Signed)
Done, thanks

## 2020-02-10 NOTE — Telephone Encounter (Signed)
Spoke to pt mother informed her that letter they asked for is ready she will come by tomorrow to pick up the letter

## 2020-02-20 ENCOUNTER — Encounter: Payer: Self-pay | Admitting: Neurology

## 2020-02-20 ENCOUNTER — Other Ambulatory Visit: Payer: Self-pay

## 2020-02-20 ENCOUNTER — Ambulatory Visit (INDEPENDENT_AMBULATORY_CARE_PROVIDER_SITE_OTHER): Payer: PPO | Admitting: Neurology

## 2020-02-20 VITALS — BP 132/82 | HR 88 | Ht 66.0 in | Wt 261.8 lb

## 2020-02-20 DIAGNOSIS — H02401 Unspecified ptosis of right eyelid: Secondary | ICD-10-CM | POA: Diagnosis not present

## 2020-02-20 DIAGNOSIS — G40211 Localization-related (focal) (partial) symptomatic epilepsy and epileptic syndromes with complex partial seizures, intractable, with status epilepticus: Secondary | ICD-10-CM

## 2020-02-20 MED ORDER — CLOBAZAM 10 MG PO TABS: ORAL_TABLET | ORAL | 3 refills | Status: DC

## 2020-02-20 MED ORDER — EPIDIOLEX 100 MG/ML PO SOLN: ORAL | 5 refills | Status: DC

## 2020-02-20 NOTE — Patient Instructions (Signed)
1. Bloodwork for CBC, CMP, myasthenia panel  2. Increase Epidiolex to 9 mL twice a day  3. Reduce Onfi 10mg : Take 2 tabs every nigth  4. Continue all your other medications  5. Follow-up in 3 months, call for any changes

## 2020-02-20 NOTE — Progress Notes (Signed)
NEUROLOGY FOLLOW UP OFFICE NOTE  Gregg Young [id number] 1975/10/31  HISTORY OF PRESENT ILLNESS: I had the pleasure of seeing Gregg Young in follow-up in the neurology clinic on 02/24/2020.  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 He is again accompanied by his mother who helps supplement the history today.  Records and images were personally reviewed where available.  He had an initial good response to addition of Epidiolex, he was previously having 2-10 seizures daily, then went seizure-free for almost 2 months. He was drowsy on his multiple AEDs and Clobazam dose has been slowly weaned down to 10mg  in AM, 20mg  in PM. He is also taking Vimpat 150mg  BID, Lamotrigine 200mg  TID, Pregabalin 225mg  TID, Levetiracetam 2000mg  BID. He is s/p VNS and DBS placement and follows up at [facility] for the DBS.   His mother called on 9/30 to report multiple seizures in a week. On 10/5, his mother called to report two falls due to seizures. Epidiolex dose increased to 47mL BID. He had a fall from seizure [place] and fell face first onto concrete. He has had one more fall since then. His mother reports 4 seizures already this morning. They both report he does not do well waking up early in the morning. His mother had also previously called reporting droopy eyelids, worse on the right. He denies any diplopia, difficulty swallowing, weakness in extremities. His mother has a history of similar symptoms requiring eyelid surgery.     Seizure History: This is a 44 yo RH man with a history of seizures since age 44. He has no recollection of events, no prior warning, witnessed by his mother to have a generalized convulsion lasting 90 to 120 seconds. He was brought to [facility] then had another convulsion 2 days later. They recall trying different medications, Depakote caused liver dysfunction, he has failed Dilantin and Zonegran. He has been on Keppra, Lamictal, Lyrica, and  most recently Vimpat. He had an EMU admission at [facility], records unavailable for review, per notes he stayed for 36 hours and had generalized seizures, multiple foci. They report two admissions for status epilepticus, one in September 2002 in the setting of weaning off Depakote, and another in January 2003. Records unavailable for review. His last GTC was around 5 years ago. He continued to have "petit mals" several times a day where his eyes would roll back, hands would shake for 30-45 seconds, if standing he would fall and had required sutures and staples in the past. He had been having the "petit mals" several times daily until 3 weeks ago when he started having a different type of episode and the petit mals "completely stopped." He was brought to the ER on 12/07/13 when his parents awoke to him dry having and speaking gibberish. He would be unable to speak, control his limbs, and cannot stand up without assistance. He can hear people around him but his jaw feels tight. The episodes can last for several hours. He has had 4 episodes in the past 3 weeks. The patient reports that they have been occurring only during the weekends, however his parents remind him he had one on Wednesday. He went to his neurologist's office on 09/10 where he had an episode while walking to the exam room where he became dizzy and fell. His father was able to catch him, and they reported his speech became affected. He was noted to be speaking in a whisper throughout  the visit. He does note that he becomes more dizzy after taking his medications. Lamictal level was 17.8, Vimpat level 7.3.   The patient lives with his parents and brother. He is on disability and mostly stays at home playing video games. He graduated high school, no special ed classes. He endorses olfactory hallucinations but cannot describe it except saying they are the same all the time.   Epilepsy Risk Factors: He had a skull fracture on the right side  after a fall at 45 months of age. Otherwise he had a normal birth and early development. There is no history of febrile convulsions, CNS infections such as meningitis/encephalitis, neurosurgical procedures, or family history of seizures.  Prior AEDs: Depakote, Dilantin, Zonegran  EEGs: 48-hour EEG (03/17/14 to 03/19/14) abnormal with multifocal discharges and right temporal slowing. Prior records note "generalized seizures, multiple foci" He had a 72-hour EEG (09/26/16 to 09/29/16) which was abnormal with bursts and runs of focal slowing over the right temporal region, bursts and runs of diffuse rhythmic slowing with intermixed generalized spikes lasting up to 120 seconds without clinical correlate, multifocal epileptiform discharges seen over the right temporal, left frontal regions, as well as generalized spikes and polyspikes. During sleep, there were bursts of generalized fast activity. There were 2 clinicoelectrographic seizures captured with right head turn and some hypermotor activity that were non-lateralizing on EEG with diffuse background suppression at seizure onset.  MRI: I personally reviewed MRI brain with and without contrast done 12/26/2013 which did not show any acute intracranial abnormality, hippocampi symmetric without abnormal signal or enhancement.  He has had an extensive evaluation at [facility] in 2019 with repeat MRI brain 07/2017 showing no clear abnormality, PET scan with questionable decreased uptake on the left, Neuropsych testing suggesting more right-sided dysfunction. He underwent sEEG which was non-lateralizing, broad activity at seizure onset with low voltage fast activity from left and right, insular origin also suspicious. He had DBS placed last 10/2017 for non-resective stimulation treatment.    PAST MEDICAL HISTORY: Past Medical History:  Diagnosis Date  . History of kidney stones   . Seizures (HCC)    daily    MEDICATIONS: Current Outpatient Medications on File  Prior to Visit  Medication Sig Dispense Refill  . cannabidiol (EPIDIOLEX) 100 MG/ML solution Take 68mL BID 372 mL 5  . cloBAZam (ONFI) 10 MG tablet Take 1 tab in AM, 3 tabs in PM (Patient taking differently: Take 1 tab in AM, 2 tabs in PM) 120 tablet 5  . Lacosamide (VIMPAT) 100 MG TABS TAKE ONE AND A HALF TABLETS BY MOUTH 2 TIMES DAILY 90 tablet 5  . lamoTRIgine (LAMICTAL) 100 MG tablet TAKE 2 TABLETS BY MOUTH 3 TIMES DAILY 180 tablet 11  . levETIRAcetam (KEPPRA) 1000 MG tablet TAKE 2 TABLETS BY MOUTH 2 TIMES DAILY 120 tablet 1  . ondansetron (ZOFRAN ODT) 4 MG disintegrating tablet Take 1 tablet (4 mg total) by mouth every 8 (eight) hours as needed for nausea or vomiting. 20 tablet 0  . pregabalin (LYRICA) 225 MG capsule Take 1 capsule (225 mg total) by mouth 3 (three) times daily. 270 capsule 3   No current facility-administered medications on file prior to visit.    ALLERGIES: Allergies  Allergen Reactions  . Penicillins Rash    Has patient had a PCN reaction causing immediate rash, facial/tongue/throat swelling, SOB or lightheadedness with hypotension: No Has patient had a PCN reaction causing severe rash involving mucus membranes or skin necrosis: No Has patient had a  PCN reaction that required hospitalization No Has patient had a PCN reaction occurring within the last 10 years: No If all of the above answers are "NO", then may proceed with Cephalosporin use.     FAMILY HISTORY: Family History  Problem Relation Age of Onset  . Healthy Mother   . Healthy Father     SOCIAL HISTORY: Social History   Socioeconomic History  . Marital status: Single    Spouse name: Not on file  . Number of children: 0  . Years of education: HS  . Highest education level: Not on file  Occupational History  . Not on file  Tobacco Use  . Smoking status: Former Smoker    Packs/day: 0.50    Years: 4.00    Pack years: 2.00    Types: Cigarettes    Quit date: 03/03/1995    Years since  quitting: 24.9  . Smokeless tobacco: Never Used  Vaping Use  . Vaping Use: Never used  Substance and Sexual Activity  . Alcohol use: No    Alcohol/week: 0.0 standard drinks  . Drug use: No  . Sexual activity: Not on file  Other Topics Concern  . Not on file  Social History Narrative   Patient is single and lives with his parents.   Patient has a high school education.   Patient is right-handed.   Patient does not have any children.   Patient is on disability.   Patient drinks three sodas daily.   Lives in one story home   Social Determinants of Health   Financial Resource Strain:   . Difficulty of Paying Living Expenses: Not on file  Food Insecurity:   . Worried About Programme researcher, broadcasting/film/videounning Out of Food in the Last Year: Not on file  . Ran Out of Food in the Last Year: Not on file  Transportation Needs:   . Lack of Transportation (Medical): Not on file  . Lack of Transportation (Non-Medical): Not on file  Physical Activity:   . Days of Exercise per Week: Not on file  . Minutes of Exercise per Session: Not on file  Stress:   . Feeling of Stress : Not on file  Social Connections:   . Frequency of Communication with Friends and Family: Not on file  . Frequency of Social Gatherings with Friends and Family: Not on file  . Attends Religious Services: Not on file  . Active Member of Clubs or Organizations: Not on file  . Attends BankerClub or Organization Meetings: Not on file  . Marital Status: Not on file  Intimate Partner Violence:   . Fear of Current or Ex-Partner: Not on file  . Emotionally Abused: Not on file  . Physically Abused: Not on file  . Sexually Abused: Not on file     PHYSICAL EXAM: Vitals:   02/20/20 1208  BP: 132/82  Pulse: 88  SpO2: 95%   General: No acute distress Head:  Normocephalic/atraumatic Skin/Extremities: No rash, no edema Neurological Exam: alert and oriented to person, place, and time. No aphasia or dysarthria. Fund of knowledge is appropriate.  Recent and  remote memory are intact.  Attention and concentration are normal.   Cranial nerves: Pupils equal, round. Extraocular movements intact with no nystagmus. Right ptosis, no fatigable ptosis on prolonged upgaze. Visual fields full.  No facial asymmetry.  Motor: Bulk and tone normal, muscle strength 5/5 throughout with no pronator drift.   Finger to nose testing intact.  Gait narrow-based and steady, able to tandem walk adequately.  Romberg negative.  VNS Therapy Management: Unit Information Implant Date: 03/10/15 Serial Number: [id number] Generator Number: 106 Parameters Output Current (mA): 2 Signal Frequency (Hz): 20 Pulse Width (usec): 250 Signal ON Time (sec): 30 Signal OFF Time (min): 5 Magnet Output Current (mA): 2.25 Magnet ON Time (sec): 60 Magnet Pulse Width (usec): 500 AutoStim Output Current (mA): 2.25 AutoStim Pulse Width (usec): 250 AutoStim ON Time (sec): 60 Tachycardia Detection : On Heartbeat Detection Sensitivity: 3 Perform Verify Heartbeat Detection: yes Threshold for AutoStim (%): 20% Diagnostics Output Current: ok Current Delievered (mA): 2 Lead Impedance: OK Impedence Value (Ohms): 2266 Battery Status Indicator (color): Green (25-50%)    IMPRESSION: This is a 44 yo RH man with intractable multifocal epilepsy. Family reports seizures started at age 5. He had an initial excellent response to initiation of Epidiolex, having multiple daily seizures to going up to 2 months seizure-free. Unfortunately, seizures have recurred with several associated falls. Increase Epidiolex to 63mL BID. He is drowsy with combination of Clobazam, reduce to 20mg  qhs. Continue Vimpat 150mg  BID, Levetiracetam 2000mg  BID, Lamotrigine 200mg  TID, and Pregabalin 225mg  TID. VNS interrogated today, no changes made. Continue follow-up at [facility] for DBS. His mother has been noticing right ptosis, myasthenia panel will be ordered, follow-up with eye doctor as scheduled.Safety labs will also be done.  He does  not drive. Follow-up in 3 months, they know to call for any changes.   Thank you for allowing me to participate in his care.  Please do not hesitate to call for any questions or concerns.   , M.D.   CC: Dr. 

## 2020-02-24 ENCOUNTER — Telehealth: Payer: Self-pay | Admitting: Neurology

## 2020-02-24 NOTE — Telephone Encounter (Signed)
Patient's mom called and said she is bringing by a letter for Dr. Karel Jarvis to sign for the patient's landlord. She needs this signed by tomorrow and she said she previously discussed this matter with Dr. Karel Jarvis. FYI only.

## 2020-02-25 ENCOUNTER — Other Ambulatory Visit: Payer: Self-pay

## 2020-02-25 ENCOUNTER — Encounter: Payer: Self-pay | Admitting: Neurology

## 2020-02-25 ENCOUNTER — Telehealth (INDEPENDENT_AMBULATORY_CARE_PROVIDER_SITE_OTHER): Payer: PPO | Admitting: Family Medicine

## 2020-02-25 ENCOUNTER — Telehealth: Payer: Self-pay | Admitting: Neurology

## 2020-02-25 DIAGNOSIS — R059 Cough, unspecified: Secondary | ICD-10-CM | POA: Diagnosis not present

## 2020-02-25 MED ORDER — DOXYCYCLINE HYCLATE 100 MG PO TABS: 100.0000 mg | ORAL_TABLET | Freq: Two times a day (BID) | ORAL | 0 refills | Status: DC

## 2020-02-25 MED ORDER — BENZONATATE 100 MG PO CAPS: 100.0000 mg | ORAL_CAPSULE | Freq: Three times a day (TID) | ORAL | 0 refills | Status: DC | PRN

## 2020-02-25 NOTE — Telephone Encounter (Signed)
Pt mother is calling to check the status of the letter that she needs Dr Karel Jarvis to do

## 2020-02-25 NOTE — Progress Notes (Signed)
Virtual Visit via Telephone Note  I connected with Gregg Young on 02/25/20 at  5:40 PM EST by telephone and verified that I am speaking with the correct person using two identifiers.   I discussed the limitations, risks, security and privacy concerns of performing an evaluation and management service by telephone and the availability of in person appointments. I also discussed with the patient that there may be a patient responsible charge related to this service. The patient expressed understanding and agreed to proceed.  Location patient: home, [place] Location provider: work or home office Participants present for the call: patient, provider Patient did not have a visit with me in the prior 7 days to address this/these issue(s).   History of Present Illness:  Acute telemedicine visit for cough and nasal congestion: -Onset: 2-3 weeks ago -Symptoms include: congestion, cough, now with thick discolored mucus -Denies:fevers, SOB, CP, NVD, inability to get out of/eat/drink -Has tried: nothing -Pertinent past medical history:see chart, denies hx of lung disease -Pertinent medication allergies:penicillins -COVID-19 vaccine status:  Fully vaccinated and had flu shot   Observations/Objective: Patient sounds cheerful and well on the phone. I do not appreciate any SOB. Speech and thought processing are grossly intact. Patient reported vitals:  Assessment and Plan:  Cough  -we discussed possible serious and likely etiologies, options for evaluation and workup, limitations of telemedicine visit vs in person visit, treatment, treatment risks and precautions. Pt prefers to treat via telemedicine empirically rather than in person at this moment.  Given duration of symptoms he opted for treatment with doxycycline 100 mg twice daily for 7 days for possible sinusitis or bacterial respiratory illness.  Tessalon Rx provided for cough.  On a number of medications for his seizures, advised he check on  interactions with the pharmacist.  Did do an interaction check in our system as well. Work/School slipped offered declined Scheduled follow up with PCP offered: Agrees to follow-up if needed. Advised to seek prompt in person care if worsening, new symptoms arise, or if is not improving with treatment. Advised of options for inperson care in case PCP office not available. Did let the patient know that I only do telemedicine shifts for [place] on Tuesdays and Thursdays and advised a follow up visit with PCP or at an [facility] if has further questions or concerns.   Follow Up Instructions:  I did not refer this patient for an OV with me in the next 24 hours for this/these issue(s).  I discussed the assessment and treatment plan with the patient. The patient was provided an opportunity to ask questions and all were answered. The patient agreed with the plan and demonstrated an understanding of the instructions.   I spent  18 minutes on this encounter.   Terressa Koyanagi, DO

## 2020-02-25 NOTE — Patient Instructions (Signed)
-  I sent the medication(s) we discussed to your pharmacy: Meds ordered this encounter  Medications   doxycycline (VIBRA-TABS) 100 MG tablet    Sig: Take 1 tablet (100 mg total) by mouth 2 (two) times daily.    Dispense:  14 tablet    Refill:  0   benzonatate (TESSALON PERLES) 100 MG capsule    Sig: Take 1 capsule (100 mg total) by mouth 3 (three) times daily as needed.    Dispense:  20 capsule    Refill:  0     I hope you are feeling better soon!  Seek in person care promptly if your symptoms worsen, new concerns arise or you are not improving with treatment.  It was nice to meet you today. I help [place] out with telemedicine visits on Tuesdays and Thursdays and am available for visits on those days. If you have any concerns or questions following this visit please schedule a follow up visit with your Primary Care doctor or seek care at a local urgent care clinic to avoid delays in care.   

## 2020-02-25 NOTE — Telephone Encounter (Signed)
Pt mother called no answer left a voice mail letting her know that letter was ready and at the front for her to pick up

## 2020-02-25 NOTE — Telephone Encounter (Signed)
Letter written. Thanks.

## 2020-03-02 ENCOUNTER — Telehealth: Payer: Self-pay | Admitting: Neurology

## 2020-03-02 ENCOUNTER — Other Ambulatory Visit (INDEPENDENT_AMBULATORY_CARE_PROVIDER_SITE_OTHER): Payer: PPO

## 2020-03-02 ENCOUNTER — Other Ambulatory Visit: Payer: Self-pay

## 2020-03-02 DIAGNOSIS — H02401 Unspecified ptosis of right eyelid: Secondary | ICD-10-CM | POA: Diagnosis not present

## 2020-03-02 DIAGNOSIS — G40211 Localization-related (focal) (partial) symptomatic epilepsy and epileptic syndromes with complex partial seizures, intractable, with status epilepticus: Secondary | ICD-10-CM | POA: Diagnosis not present

## 2020-03-02 LAB — CBC
HCT: 42 % (ref 39.0–52.0)
Hemoglobin: 14.5 g/dL (ref 13.0–17.0)
MCHC: 34.4 g/dL (ref 30.0–36.0)
MCV: 93.1 fl (ref 78.0–100.0)
Platelets: 331 10*3/uL (ref 150.0–400.0)
RBC: 4.51 Mil/uL (ref 4.22–5.81)
RDW: 13.3 % (ref 11.5–15.5)
WBC: 5.7 10*3/uL (ref 4.0–10.5)

## 2020-03-02 LAB — COMPREHENSIVE METABOLIC PANEL
ALT: 27 U/L (ref 0–53)
AST: 16 U/L (ref 0–37)
Albumin: 4.4 g/dL (ref 3.5–5.2)
Alkaline Phosphatase: 75 U/L (ref 39–117)
BUN: 13 mg/dL (ref 6–23)
CO2: 29 mEq/L (ref 19–32)
Calcium: 9 mg/dL (ref 8.4–10.5)
Chloride: 105 mEq/L (ref 96–112)
Creatinine, Ser: 0.89 mg/dL (ref 0.40–1.50)
GFR: 103.36 mL/min (ref >60.00)
Glucose, Bld: 125 mg/dL — ABNORMAL HIGH (ref 70–99)
Potassium: 3.9 mEq/L (ref 3.5–5.1)
Sodium: 141 mEq/L (ref 135–145)
Total Bilirubin: 0.7 mg/dL (ref 0.2–1.2)
Total Protein: 7.3 g/dL (ref 6.0–8.3)

## 2020-03-02 NOTE — Telephone Encounter (Signed)
Pt is coming today for some blood work they would like to know if we can put in a order for the thyroid to be checked as well

## 2020-03-02 NOTE — Telephone Encounter (Signed)
Pt mother called and informed that Dr Karel Jarvis is out of the office they need to contact pt PCP to get that blood work ordered

## 2020-03-06 LAB — MYASTHENIA GRAVIS PANEL 1
A CHR BINDING ABS: <0.3 nmol/L
STRIATED MUSCLE AB SCREEN: NEGATIVE

## 2020-03-10 ENCOUNTER — Telehealth: Payer: Self-pay

## 2020-03-10 NOTE — Telephone Encounter (Signed)
-----   Message from Van Clines, MD sent at 03/09/2020  6:19 PM EST ----- Pls let mother know bloodwork was normal. thanks

## 2020-03-10 NOTE — Telephone Encounter (Signed)
Pt mother called no answer left a voice mail to call the office back  

## 2020-03-11 ENCOUNTER — Telehealth: Payer: Self-pay

## 2020-03-11 DIAGNOSIS — H02401 Unspecified ptosis of right eyelid: Secondary | ICD-10-CM

## 2020-03-11 NOTE — Telephone Encounter (Signed)
-----   Message from Karen M Aquino, MD sent at 03/09/2020  6:19 PM EST ----- Pls let mother know bloodwork was normal. thanks 

## 2020-03-11 NOTE — Telephone Encounter (Signed)
Spoke with pt mother informed her that bloodwork was normal. Pt mother wanted Dr Karel Jarvis to know that [place] now can not open his right eye. Asking what is the next step?

## 2020-03-12 NOTE — Telephone Encounter (Addendum)
Spoke with pt father we are going to order CTA head with and without contrast rule out aneurysm. Pls also have her call eye doctor and see if they can see him earlier. Pt father is going to call the eye doctor to see if they can get a sooner appointment. CTA head order placed in Epic

## 2020-03-12 NOTE — Addendum Note (Signed)
Addended by: Dimas Chyle on: 03/12/2020 08:42 AM   Modules accepted: Orders

## 2020-03-12 NOTE — Telephone Encounter (Signed)
Pls order CTA head with and without contrast for dx: ptosis, rule out aneurysm. Pls also have her call eye doctor and see if they can see him earlier. Thanks

## 2020-03-16 ENCOUNTER — Other Ambulatory Visit: Payer: Self-pay | Admitting: Neurology

## 2020-03-17 ENCOUNTER — Telehealth: Payer: Self-pay | Admitting: Neurology

## 2020-03-17 NOTE — Telephone Encounter (Signed)
Patient's mom called and said, "His upcoming CT scan is not until 03/30/20. Should I try to get this moved up up to sooner? Should I be concerned?"

## 2020-03-18 NOTE — Telephone Encounter (Signed)
Is this okay or move up appt.

## 2020-03-18 NOTE — Telephone Encounter (Signed)
Left a voice mail per DPR If he cannot open his eye at all, Dr Karel Jarvis would have her move it up sooner

## 2020-03-18 NOTE — Telephone Encounter (Signed)
If he cannot open his eye at all, I would have her move it up sooner. Thanks

## 2020-03-19 ENCOUNTER — Telehealth: Payer: Self-pay | Admitting: Neurology

## 2020-03-19 NOTE — Telephone Encounter (Signed)
Patient's mother called in and said they were unable to get his CT scan moved up here, so she called [facility] and was able to schedule it for 03/24/20 with [facility].

## 2020-03-26 ENCOUNTER — Telehealth: Payer: Self-pay | Admitting: Neurology

## 2020-03-26 DIAGNOSIS — H02409 Unspecified ptosis of unspecified eyelid: Secondary | ICD-10-CM | POA: Diagnosis not present

## 2020-03-26 NOTE — Telephone Encounter (Signed)
Patient's mom called to let Dr. Karel Jarvis know the patient had his imaging at [facility] today.

## 2020-03-30 ENCOUNTER — Other Ambulatory Visit: Payer: PPO

## 2020-03-30 NOTE — Telephone Encounter (Signed)
Spoke with pt mother Mrs Pare, Dr Karel Jarvis reviewed the report, it looks okay, nothing to explain his symptoms, brain looks fine, blood vessels also look okay, nothing compressing on the nerves that we can see. At this point, pls have him call eye doctor. She can try applying ice on the eyelid and see if this helps open up

## 2020-03-30 NOTE — Telephone Encounter (Signed)
Pls let mother know I reviewed the report, it looks okay, nothing to explain his symptoms, brain looks fine, blood vessels also look okay, nothing compressing on the nerves that we can see. At this point, pls have him call eye doctor. She can try applying ice on the eyelid and see if this helps open up. Thanks

## 2020-03-30 NOTE — Telephone Encounter (Signed)
Pt mother called no answer left a voice mail to call the office back  

## 2020-04-06 ENCOUNTER — Telehealth: Payer: Self-pay | Admitting: Family Medicine

## 2020-04-06 NOTE — Telephone Encounter (Signed)
Left message for patient to call back and schedule Medicare Annual Wellness Visit (AWV) either virtually OR in office.   No hx; please schedule at anytime with [facility]Nurse Health Advisor at [facility].  This should be a 45 minute visit.   

## 2020-04-08 ENCOUNTER — Telehealth: Payer: Self-pay | Admitting: Neurology

## 2020-04-08 NOTE — Telephone Encounter (Signed)
Patient's mother called wanting to schedule an appointment today with Dr Karel Jarvis. Let her know that Dr Karel Jarvis is booked up and doesn't have anything prior to his next scheduled appt. She requested a message be sent to Dr Karel Jarvis to let her know that patient had another fall yesterday and is having extreme dizziness. She believes that Dr Karel Jarvis will want a CT scan but patient told her that he won't agree to that until he sees Dr Karel Jarvis first. Please call.

## 2020-04-08 NOTE — Telephone Encounter (Signed)
Spoke with pt mother is requesting that Gregg Young be seen today she was informed that Dr Karel Jarvis has a full schedule she asked if another MD could see him she was told no because they do not know his care. She asked why will nobody help her? Pt mother advised that if he is having a change in how is normally is he needs to go to the ER she stated that Pt can not sit in the ER for 8 hours. She was informed to tell them that he had, had a fall and was having and ALOC and that he would probably not have to wait that long with him having a change. Pt mother stated that at lunch she was going to leave work and go home to check on him.

## 2020-04-08 NOTE — Telephone Encounter (Signed)
Spoke to mother. Gregg Young had another big fall yesterday, and he is still dizzy. The wait for different urgent care is too long, he has a hole in his chin that she put steri-strips. Spinning when he moves, when he is still he is fine. No nausea, vomiting, focal weakness. They have to help him with walking. Discussed that this is most likely vertigo from hitting head, can do vestibular therapy. Mother will check how he is doing when she gets home.  He still has difficulty opening his eyelid, then other eye vision is not good, he is squinting. Eye appt this month. He moved out Dec 5, he fell at his apt yesterday and mother brought him home. He had not eaten and did not take his mid-day medicine. Discussed that at this point he should stay home with parents, they will have discussion tonight. Discussed that if symptoms change, go to ER, mother expressed understanding.

## 2020-04-08 NOTE — Telephone Encounter (Signed)
Patient's mom called and left a message requesting a call back from a nurse.

## 2020-04-22 DIAGNOSIS — H02831 Dermatochalasis of right upper eyelid: Secondary | ICD-10-CM | POA: Diagnosis not present

## 2020-04-22 DIAGNOSIS — H02834 Dermatochalasis of left upper eyelid: Secondary | ICD-10-CM | POA: Diagnosis not present

## 2020-04-22 DIAGNOSIS — H02401 Unspecified ptosis of right eyelid: Secondary | ICD-10-CM | POA: Diagnosis not present

## 2020-04-22 DIAGNOSIS — H02102 Unspecified ectropion of right lower eyelid: Secondary | ICD-10-CM | POA: Diagnosis not present

## 2020-05-13 ENCOUNTER — Encounter (HOSPITAL_COMMUNITY): Payer: Self-pay

## 2020-05-13 ENCOUNTER — Encounter (HOSPITAL_COMMUNITY): Payer: Self-pay | Admitting: *Deleted

## 2020-05-13 ENCOUNTER — Other Ambulatory Visit: Payer: Self-pay

## 2020-05-13 ENCOUNTER — Emergency Department (HOSPITAL_COMMUNITY)
Admission: EM | Admit: 2020-05-13 | Discharge: 2020-05-13 | Disposition: A | Discharge status: Home / Self Care | Payer: PPO | Attending: Emergency Medicine | Admitting: Emergency Medicine

## 2020-05-13 ENCOUNTER — Ambulatory Visit (HOSPITAL_COMMUNITY)
Admission: EM | Admit: 2020-05-13 | Discharge: 2020-05-13 | Disposition: A | Discharge status: Home / Self Care | Payer: PPO | Attending: Emergency Medicine | Admitting: Emergency Medicine

## 2020-05-13 DIAGNOSIS — S0191XA Laceration without foreign body of unspecified part of head, initial encounter: Secondary | ICD-10-CM | POA: Diagnosis not present

## 2020-05-13 DIAGNOSIS — W228XXA Striking against or struck by other objects, initial encounter: Secondary | ICD-10-CM | POA: Diagnosis not present

## 2020-05-13 DIAGNOSIS — S0990XA Unspecified injury of head, initial encounter: Secondary | ICD-10-CM | POA: Diagnosis present

## 2020-05-13 DIAGNOSIS — G40909 Epilepsy, unspecified, not intractable, without status epilepticus: Secondary | ICD-10-CM

## 2020-05-13 DIAGNOSIS — S0101XA Laceration without foreign body of scalp, initial encounter: Secondary | ICD-10-CM

## 2020-05-13 DIAGNOSIS — R569 Unspecified convulsions: Secondary | ICD-10-CM | POA: Insufficient documentation

## 2020-05-13 DIAGNOSIS — Z5321 Procedure and treatment not carried out due to patient leaving prior to being seen by health care provider: Secondary | ICD-10-CM | POA: Insufficient documentation

## 2020-05-13 NOTE — ED Triage Notes (Signed)
Pt reports that he has a seizure tonight and hit his head on the doorway, laceration to top of head, bleeding controlled, last tetanus unknown.

## 2020-05-13 NOTE — Discharge Instructions (Signed)
Due to your head injury and seizures it is recommended that you follow up with your neurologist, and the Emergency Department for any worsening of symptoms or concerns- increased headache, vision changes, confusion, weakness, vomiting, dizziness, or otherwise worsening.  Return staples in approximately 7 days.  Cleanse wound daily with soap and water. Avoid touching to prevent infection.  Return for any concerns of infection- redness, swelling, pain, or pus drainage.

## 2020-05-13 NOTE — ED Provider Notes (Signed)
[facility]    CSN: [id number] Arrival date & time: 05/13/20  5916      History   Chief Complaint Chief Complaint  Patient presents with  . Fall  . Head Injury  . Loss of Consciousness  . Laceration  . Seizures    HPI Gregg Young is a 45 y.o. male.   GORMAN SAFI presents with his mother with complaints of scalp laceration s/p seizure fall and head injury this morning around 0500. Long standing history of seizures, unfortunately, with deep brain stimulator in place. Anywhere from 2-10 seizures every day. His mother endorses medication compliance. He was up to take his morning medication this morning when he seized, fell, and struck his head on a door frame. His mother took him to the ER this morning for wound management but left due to wait time. No other new neurological symptoms. No history of DM. No new headache. No dizziness, he has no aura's prior to onset of seizure. No other mental status changes per patient and mother. No nausea or vomiting. No new fevers, or URI symptoms. No other new complaints. He has two neurologists he follows with, has appointment next week and mother plans to call today as well.     ROS per HPI, negative if not otherwise mentioned.      Past Medical History:  Diagnosis Date  . History of kidney stones   . Seizures (HCC)    daily    Patient Active Problem List   Diagnosis Date Noted  . 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 Obstructive sleep apnea 05/08/2017  . Excessive daytime sleepiness 02/28/2017  . Dizziness and giddiness 07/08/2014  . 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 Partial symptomatic epilepsy (HCC) 07/13/2012    Past Surgical History:  Procedure Laterality Date  . HERNIA REPAIR     baby  . stimulator battery replacement  01/2020   @[facility]   . VAGUS NERVE STIMULATOR INSERTION Left 03/10/2015   Procedure:  LEFT SIDED PLACEMENT VAGAL NERVE STIMULATOR IMPLANT;  Surgeon: 14/09/2014, MD;  Location: [facility];  Service: Neurosurgery;  Laterality: Left;       Home Medications    Prior to Admission medications   Medication Sig Start Date End Date Taking? Authorizing Provider  cannabidiol (EPIDIOLEX) 100 MG/ML solution Take 60mL BID 02/20/20  Yes 02/22/20, MD  cloBAZam (ONFI) 10 MG tablet Take 2 tabs at night 02/20/20  Yes 02/22/20, MD  Lacosamide (VIMPAT) 100 MG TABS TAKE ONE AND A HALF TABLETS BY MOUTH 2 TIMES DAILY 02/10/20  Yes 13/8/21, MD  lamoTRIgine (LAMICTAL) 100 MG tablet TAKE 2 TABLETS BY MOUTH 3 TIMES DAILY 08/16/19  Yes 08/18/19, MD  levETIRAcetam (KEPPRA) 1000 MG tablet TAKE 2 TABLETS BY MOUTH 2 TIMES DAILY 03/16/20  Yes 03/18/20, MD  pregabalin (LYRICA) 225 MG capsule TAKE 1 CAPSULE BY MOUTH 3 TIMES DAILY 03/16/20  Yes 03/18/20, MD  benzonatate (TESSALON PERLES) 100 MG capsule Take 1 capsule (100 mg total) by mouth 3 (three) times daily as needed. 02/25/20   02/27/20, DO  doxycycline (VIBRA-TABS) 100 MG tablet Take 1 tablet (100 mg total) by mouth 2 (two) times daily. 02/25/20   02/27/20, DO  ondansetron (ZOFRAN ODT) 4 MG disintegrating tablet Take 1 tablet (4 mg total) by mouth every 8 (eight) hours as needed for nausea  or vomiting. 12/03/19   Shelva Majestic, MD    Family History Family History  Problem Relation Age of Onset  . Healthy Mother   . Healthy Father     Social History Social History   Tobacco Use  . Smoking status: Former Smoker    Packs/day: 0.50    Years: 4.00    Pack years: 2.00    Types: Cigarettes    Quit date: 03/03/1995    Years since quitting: 25.2  . Smokeless tobacco: Never Used  Vaping Use  . Vaping Use: Never used  Substance Use Topics  . Alcohol use: No    Alcohol/week: 0.0 standard drinks  . Drug use: No     Allergies   Penicillins   Review of Systems Review of  Systems   Physical Exam Triage Vital Signs ED Triage Vitals  Enc Vitals Group     BP 05/13/20 0846 134/75     Pulse Rate 05/13/20 0846 79     Resp 05/13/20 0846 20     Temp 05/13/20 0846 98.3 F (36.8 C)     Temp Source 05/13/20 0846 Oral     SpO2 05/13/20 0848 96 %     Weight --      Height --      Head Circumference --      Peak Flow --      Pain Score 05/13/20 0847 6     Pain Loc --      Pain Edu? --      Excl. in GC? --    No data found.  Updated Vital Signs BP 134/75 (BP Location: Right Arm)   Pulse 79   Temp 98.3 F (36.8 C) (Oral)   Resp 20   SpO2 96%   Visual Acuity Right Eye Distance:   Left Eye Distance:   Bilateral Distance:    Right Eye Near:   Left Eye Near:    Bilateral Near:     Physical Exam Constitutional:      Appearance: He is well-developed.  HENT:     Head: Laceration present.      Comments: Approximately 1 cm laceration to anterior scalp, approximately 66mm in depth and well approximated, minimal bleeding. Minimal surrounding tenderness; no hematoma     Mouth/Throat:     Mouth: Mucous membranes are moist.  Eyes:     Extraocular Movements: Extraocular movements intact.  Cardiovascular:     Rate and Rhythm: Normal rate.  Pulmonary:     Effort: Pulmonary effort is normal.  Musculoskeletal:        General: Normal range of motion.  Skin:    General: Skin is warm and dry.  Neurological:     Mental Status: He is alert and oriented to person, place, and time. Mental status is at baseline.     Comments: Patient became clammy and brief loss of awareness noted following staple removal, lasting seconds only, mother states this is what seizure looks like. Alert and oriented only seconds later   Psychiatric:        Mood and Affect: Mood normal.      UC Treatments / Results  Labs (all labs ordered are listed, but only abnormal results are displayed) Labs Reviewed - No data to display  EKG   Radiology No results  found.  Procedures Laceration Repair  Date/Time: 05/13/2020 9:21 AM Performed by: Georgetta Haber, NP Authorized by: Georgetta Haber, NP   Consent:    Consent obtained:  Verbal  Consent given by:  Patient and parent   Risks, benefits, and alternatives were discussed: yes     Risks discussed:  Infection, need for additional repair, poor cosmetic result and pain   Alternatives discussed:  Observation, referral and no treatment Universal protocol:    Patient identity confirmed:  Verbally with patient Anesthesia:    Anesthesia method:  None Laceration details:    Location:  Scalp   Scalp location:  Frontal (right anterior frontal )   Length (cm):  1   Depth (mm):  2 Exploration:    Wound exploration: wound explored through full range of motion     Wound extent: no foreign bodies/material noted   Treatment:    Area cleansed with:  Povidone-iodine and Shur-Clens Skin repair:    Repair method:  Staples   Number of staples:  2 Approximation:    Approximation:  Close Repair type:    Repair type:  Simple Post-procedure details:    Dressing:  Non-adherent dressing   Procedure completion:  Tolerated   (including critical care time)  Medications Ordered in UC Medications - No data to display  Initial Impression / Assessment and Plan / UC Course  I have reviewed the triage vital signs and the nursing notes.  Pertinent labs & imaging results that were available during my care of the patient were reviewed by me and considered in my medical decision making (see chart for details).     Two staples placed to scalp. Long standing history of seizures, with daily seizures, sometimes even up to 10 a day, following with neurology. Patient and mother provided with limitations of neurological evaluation here in urgent care, and they decline er evaluation or further evaluation today, requesting wound care only. Risks and return precautions provided in discussion of head injury related to  fall. Mother plans to call neurologist today.  Final Clinical Impressions(s) / UC Diagnoses   Final diagnoses:  Laceration of scalp, initial encounter  Seizure disorder [facility])     Discharge Instructions     Due to your head injury and seizures it is recommended that you follow up with your neurologist, and the Emergency Department for any worsening of symptoms or concerns- increased headache, vision changes, confusion, weakness, vomiting, dizziness, or otherwise worsening.  Return staples in approximately 7 days.  Cleanse wound daily with soap and water. Avoid touching to prevent infection.  Return for any concerns of infection- redness, swelling, pain, or pus drainage.    ED Prescriptions    None     PDMP not reviewed this encounter.   Georgetta Haber, NP 05/13/20 [phone]

## 2020-05-13 NOTE — ED Triage Notes (Signed)
Pt Family member reports Pt had a seizure and fell at 0500 this AM Pt was in the ED waiting room and CG left to come to UC because the wait was to long. Pt observed with Lac to anterior scalp . Pt is A/O x4  And ambulatory with out assistance to room . Pt's CG reports Pt passed out when he fell.

## 2020-05-14 ENCOUNTER — Telehealth: Payer: Self-pay | Admitting: Neurology

## 2020-05-14 DIAGNOSIS — G40211 Localization-related (focal) (partial) symptomatic epilepsy and epileptic syndromes with complex partial seizures, intractable, with status epilepticus: Secondary | ICD-10-CM

## 2020-05-14 NOTE — Telephone Encounter (Signed)
Patient's mother called in to make Dr. Karel Jarvis aware that the patient has had 2 falls recently. One yesterday and had to get staples in the ED and he also fell today and busted his lip. He now only takes the Onfi in the evenings.

## 2020-05-15 MED ORDER — CLOBAZAM 10 MG PO TABS: ORAL_TABLET | ORAL | 3 refills | Status: DC

## 2020-05-15 NOTE — Telephone Encounter (Signed)
Pls have mother increase the Onfi 10mg : 3 tabs qhs, we will increase his dose again but give all at night so hopefully will not cause the daytime drowsiness as much. Also, pls have her ask his eye doctor to send records to our office so we have it for his visit next week. Thanks

## 2020-05-15 NOTE — Telephone Encounter (Signed)
Spoke with pt mother Dr Karel Jarvis would like to increase the Onfi 10mg : 3 tabs qhs, we will increase his dose again but give all at night so hopefully will not cause the daytime drowsiness as much. They did not get to go the eye Dr because had a seizure and a  fall. Pt mother stated they did not need a refill they have medication on hand, but informed one was called in just incase,

## 2020-05-22 ENCOUNTER — Encounter: Payer: Self-pay | Admitting: Neurology

## 2020-05-22 ENCOUNTER — Other Ambulatory Visit: Payer: Self-pay

## 2020-05-22 ENCOUNTER — Ambulatory Visit (INDEPENDENT_AMBULATORY_CARE_PROVIDER_SITE_OTHER): Payer: PPO | Admitting: Neurology

## 2020-05-22 VITALS — BP 120/81 | HR 83 | Ht 66.0 in | Wt 262.8 lb

## 2020-05-22 DIAGNOSIS — G40211 Localization-related (focal) (partial) symptomatic epilepsy and epileptic syndromes with complex partial seizures, intractable, with status epilepticus: Secondary | ICD-10-CM

## 2020-05-22 DIAGNOSIS — H02401 Unspecified ptosis of right eyelid: Secondary | ICD-10-CM

## 2020-05-22 NOTE — Progress Notes (Signed)
NEUROLOGY FOLLOW UP OFFICE NOTE  Gregg FieldRussell A Young [id number] 15-Jul-1975  HISTORY OF PRESENT ILLNESS: I had the pleasure of seeing Gregg Young in follow-up in the neurology clinic on 05/22/2020.  The patient was last seen 3 months ago for intractable multifocal epilepsy s/p VNS placement, s/p DBS placement, on 6 AEDs with continued seizures. Epidiolex was increased to 9mL BID on last visit, clobazam dose reduced due to sedation. However he started having an increase in seizures with associated falls and injuries. He was in the ER on 05/13/20 for scalp laceration after fall from seizure, staples placed. Clobazam dose increased back to 30mg  qhs. His mother reports a fall from a seizure while at home 30 minutes ago. He is also on Vimpat 150mg  BID, Lamotrigine 200mg  TID, Pregabalin 225mg  TID, Levetiracetam 2000mg  BID. He is s/p VNS and DBS placement and [facility]. He has had a significant increase in falls due to seizure over the past few months. He has seen his ophthalmologist and will be seeing an eye surgeon for his right eyelid on 06/11/20. He denies any headaches, dizziness. He has severe sleep apnea but refuses to wear a CPAP, he gets around 6 hours of sleep, drowsy during the day.    Seizure History: This is a 45 yo RH man with a history of seizures since age 45. He has no recollection of events, no prior warning, witnessed by his mother to have a generalized convulsion lasting 90 to 120 seconds. [facility] then had another convulsion 2 days later. They recall trying different medications, Depakote caused liver dysfunction, he has failed Dilantin and Zonegran. He has been on Keppra, Lamictal, Lyrica, and most recently Vimpat. [facility] had [facility], records unavailable for review, per notes he stayed for 36 hours and had generalized seizures, multiple foci. They report two admissions for status epilepticus, one in September 2002 in the setting of weaning  off Depakote, and another in January 2003. Records unavailable for review. His last GTC was around 5 years ago. He continued to have "petit mals" several times a day where his eyes would roll back, hands would shake for 30-45 seconds, if standing he would fall and had required sutures and staples in the past. He had been having the "petit mals" several times daily until 3 weeks ago when he started having a different type of episode and the petit mals "completely stopped." He was brought to the ER on 12/07/13 when his parents awoke to him dry having and speaking gibberish. He would be unable to speak, control his limbs, and cannot stand up without assistance. He can hear people around him but his jaw feels tight. The episodes can last for several hours. He has had 4 episodes in the past 3 weeks. The patient reports that they have been occurring only during the weekends, however his parents remind him he had one on Wednesday. He went to his neurologist's office on 09/10 where he had an episode while walking to the exam room where he became dizzy and fell. His father was able to catch him, and they reported his speech became affected. He was noted to be speaking in a whisper throughout the visit. He does note that he becomes more dizzy after taking his medications. Lamictal level was 17.8, Vimpat level 7.3.   The patient lives with his parents and brother. He is on disability and mostly stays at home playing video games. He graduated  high school, no special ed classes. He endorses olfactory hallucinations but cannot describe it except saying they are the same all the time.   Epilepsy Risk Factors: He had a skull fracture on the right side after a fall at 21 months of age. Otherwise he had a normal birth and early development. There is no history of febrile convulsions, CNS infections such as meningitis/encephalitis, neurosurgical procedures, or family history of seizures.  Prior AEDs: Depakote, Dilantin,  Zonegran  EEGs: 48-hour EEG (03/17/14 to 03/19/14) abnormal with multifocal discharges and right temporal slowing. Prior records note "generalized seizures, multiple foci" He had a 72-hour EEG (09/26/16 to 09/29/16) which was abnormal with bursts and runs of focal slowing over the right temporal region, bursts and runs of diffuse rhythmic slowing with intermixed generalized spikes lasting up to 120 seconds without clinical correlate, multifocal epileptiform discharges seen over the right temporal, left frontal regions, as well as generalized spikes and polyspikes. During sleep, there were bursts of generalized fast activity. There were 2 clinicoelectrographic seizures captured with right head turn and some hypermotor activity that were non-lateralizing on EEG with diffuse background suppression at seizure onset.  MRI: I personally reviewed MRI brain with and without contrast done 12/26/2013 which did not show any acute intracranial abnormality, hippocampi symmetric without abnormal signal or enhancement.  He has had an extensive evaluation at [facility] in 2019 with repeat MRI brain 07/2017 showing no clear abnormality, PET scan with questionable decreased uptake on the left, Neuropsych testing suggesting more right-sided dysfunction. He underwent sEEG which was non-lateralizing, broad activity at seizure onset with low voltage fast activity from left and right, insular origin also suspicious. He had DBS placed last 10/2017 for non-resective stimulation treatment.     PAST MEDICAL HISTORY: Past Medical History:  Diagnosis Date  . History of kidney stones   . Seizures (HCC)    daily    MEDICATIONS: Current Outpatient Medications on File Prior to Visit  Medication Sig Dispense Refill  . cannabidiol (EPIDIOLEX) 100 MG/ML solution Take 64mL BID 540 mL 5  . cloBAZam (ONFI) 10 MG tablet Take 3 tabs at night 270 tablet 3  . Lacosamide (VIMPAT) 100 MG TABS TAKE ONE AND A HALF TABLETS BY MOUTH 2 TIMES DAILY 90  tablet 5  . lamoTRIgine (LAMICTAL) 100 MG tablet TAKE 2 TABLETS BY MOUTH 3 TIMES DAILY 180 tablet 11  . levETIRAcetam (KEPPRA) 1000 MG tablet TAKE 2 TABLETS BY MOUTH 2 TIMES DAILY 120 tablet 3  . pregabalin (LYRICA) 225 MG capsule TAKE 1 CAPSULE BY MOUTH 3 TIMES DAILY 90 capsule 3   No current facility-administered medications on file prior to visit.    ALLERGIES: Allergies  Allergen Reactions  . Penicillins Rash    Has patient had a PCN reaction causing immediate rash, facial/tongue/throat swelling, SOB or lightheadedness with hypotension: No Has patient had a PCN reaction causing severe rash involving mucus membranes or skin necrosis: No Has patient had a PCN reaction that required hospitalization No Has patient had a PCN reaction occurring within the last 10 years: No If all of the above answers are "NO", then may proceed with Cephalosporin use.     FAMILY HISTORY: Family History  Problem Relation Age of Onset  . Healthy Mother   . Healthy Father     SOCIAL HISTORY: Social History   Socioeconomic History  . Marital status: Single    Spouse name: Not on file  . Number of children: 0  . Years of education: HS  .  Highest education level: Not on file  Occupational History  . Not on file  Tobacco Use  . Smoking status: Former Smoker    Packs/day: 0.50    Years: 4.00    Pack years: 2.00    Types: Cigarettes    Quit date: 03/03/1995    Years since quitting: 25.2  . Smokeless tobacco: Never Used  Vaping Use  . Vaping Use: Never used  Substance and Sexual Activity  . Alcohol use: No    Alcohol/week: 0.0 standard drinks  . Drug use: No  . Sexual activity: Not on file  Other Topics Concern  . Not on file  Social History Narrative   Patient is single and lives with his parents.   Patient has a high school education.   Patient is right-handed.   Patient does not have any children.   Patient is on disability.   Patient drinks three sodas daily.   Lives in one story  home   Social Determinants of Health   Financial Resource Strain: Not on file  Food Insecurity: Not on file  Transportation Needs: Not on file  Physical Activity: Not on file  Stress: Not on file  Social Connections: Not on file  Intimate Partner Violence: Not on file     PHYSICAL EXAM: Vitals:   05/22/20 1401  BP: 120/81  Pulse: 83  SpO2: 95%   General: No acute distress Head:  Normocephalic, staples on frontal scalp Skin/Extremities: No rash, no edema Neurological Exam: alert and oriented to person, place, and time. No aphasia or dysarthria. Fund of knowledge is appropriate.  Recent and remote memory are intact.  Attention and concentration are normal.   Cranial nerves: Pupils equal, round. Extraocular movements intact with no nystagmus. Initially right lid was closed, patient is able to open eyelid during testing with no fatigable ptosis noted. Visual fields full.  Motor: Bulk and tone normal, muscle strength 5/5 throughout with no pronator drift.   Finger to nose testing intact.  Gait wide-based, no ataxia  VNS Therapy Management: Parameters Output Current (mA): 2 Signal Frequency (Hz): 20 Pulse Width (usec): 250 Signal ON Time (sec): 30 Signal OFF Time (min): 3 Magnet Output Current (mA): 2.25 Magnet ON Time (sec): 60 Magnet Pulse Width (usec): 500 AutoStim Output Current (mA): 2.25 AutoStim Pulse Width (usec): 500 AutoStim ON Time (sec): 60 Tachycardia Detection : On Heartbeat Detection Sensitivity: 3 Perform Verify Heartbeat Detection: yes Threshold for AutoStim (%): 20 Diagnostics Current Delievered (mA): 2 Impedence Value (Ohms): 2320 Battery Status Indicator (color): Green (25-50%)   IMPRESSION: This is a 45 yo RH man with intractable multifocal epilepsy. Family reports seizures started at age 34. He had an initial excellent response to initiation of Epidiolex, having multiple daily seizures to going up to 2 months seizure-free. He was having drowsiness due  to polypharmacy, however with reduction in clobazam dose, he started having an increase in seizures with significant falls. Clobazam increased to 30mg  qhs last week, continue on current dose for now, mother will update in a month, we may plan to increase again if needed. Continue Epidiolex 19mL BID, Vimpat 150mg  BID, Levetiracetam 2000mg  BID, Lamotrigine 200mg  TID, and Pregabalin 225mg  TID. Uncontrolled sleep apnea again discussed as contributing to daytime drowsiness. His VNS was interrogated today, off time reduced to 3 minutes. Continue follow-up at [facility] for DBS. Right lid ptosis again noted today, myasthenia panel negative, CTA no evidence of aneurysm. He has an appointment with an eye surgeon next month. We discussed wearing  a helmet due to frequent falls. He does not drive. Follow-up in 3 months, they know to call for any changes.   Thank you for allowing me to participate in his care.  Please do not hesitate to call for any questions or concerns.   Patrcia Dolly, M.D.   CC: Dr. Durene Cal, Dr. Laveda Norman

## 2020-05-22 NOTE — Patient Instructions (Signed)
1. Swipe your magnet twice a day  2. Continue all your current medications. Update me in a month on how you are feeling, we will plan to increase Onfi if needed  3. Will look into helmet  4. Follow-up in 3 months, call for any changes

## 2020-05-22 NOTE — Progress Notes (Signed)
Eye dr March 10th

## 2020-06-01 ENCOUNTER — Telehealth: Payer: Self-pay | Admitting: Neurology

## 2020-06-01 ENCOUNTER — Encounter: Payer: Self-pay | Admitting: Neurology

## 2020-06-01 NOTE — Telephone Encounter (Signed)
Patient returned call to [name]. 

## 2020-06-01 NOTE — Telephone Encounter (Signed)
LMOVM to call back so we can discuss what the letter is for and what needs to be in the letter

## 2020-06-01 NOTE — Telephone Encounter (Signed)
Patient needs a letter for a basketball game. She wanted to give the details when someone calls her back.

## 2020-06-01 NOTE — Telephone Encounter (Signed)
Done, thanks

## 2020-06-01 NOTE — Telephone Encounter (Signed)
Pt mother needs to have a letter stating that she needed to have a letter stating that the pt unable to go due his falls. Pt has to climb steps so her and the father feel that right now that is not safe for the pt   Per Pt mother DR.Aqunio has written a letter in the past for the patient.

## 2020-06-01 NOTE — Telephone Encounter (Signed)
Letter at the front, Pt mother advised. Will come by in the morning to pick up letter.

## 2020-06-11 ENCOUNTER — Telehealth: Payer: Self-pay | Admitting: Neurology

## 2020-06-11 DIAGNOSIS — H02423 Myogenic ptosis of bilateral eyelids: Secondary | ICD-10-CM | POA: Diagnosis not present

## 2020-06-11 NOTE — Telephone Encounter (Signed)
Patient's mother called in stating the patient is going to have eye surgery and needs a letter from Dr. Karel Jarvis stating it is alright to put him under anesthesia. She will pick up the letter in the office once it is complete.

## 2020-06-18 NOTE — Telephone Encounter (Signed)
Patient's mom called to see if the letter is ready yet.

## 2020-06-19 ENCOUNTER — Encounter: Payer: Self-pay | Admitting: Neurology

## 2020-06-19 NOTE — Telephone Encounter (Signed)
Spoke with pt father informed him that letter will be at the front for them to pickup

## 2020-06-19 NOTE — Telephone Encounter (Signed)
Done, thanks

## 2020-06-29 ENCOUNTER — Other Ambulatory Visit: Payer: Self-pay | Admitting: Neurology

## 2020-07-06 ENCOUNTER — Other Ambulatory Visit: Payer: Self-pay | Admitting: Neurology

## 2020-07-09 DIAGNOSIS — H02423 Myogenic ptosis of bilateral eyelids: Secondary | ICD-10-CM | POA: Diagnosis not present

## 2020-07-09 DIAGNOSIS — G40909 Epilepsy, unspecified, not intractable, without status epilepticus: Secondary | ICD-10-CM | POA: Diagnosis not present

## 2020-07-17 DIAGNOSIS — Z79899 Other long term (current) drug therapy: Secondary | ICD-10-CM | POA: Diagnosis not present

## 2020-07-17 DIAGNOSIS — Z9682 Presence of neurostimulator: Secondary | ICD-10-CM | POA: Diagnosis not present

## 2020-07-17 DIAGNOSIS — G40409 Other generalized epilepsy and epileptic syndromes, not intractable, without status epilepticus: Secondary | ICD-10-CM | POA: Diagnosis not present

## 2020-07-17 DIAGNOSIS — Z880 Allergy status to penicillin: Secondary | ICD-10-CM | POA: Diagnosis not present

## 2020-07-17 DIAGNOSIS — H02423 Myogenic ptosis of bilateral eyelids: Secondary | ICD-10-CM | POA: Diagnosis not present

## 2020-08-11 NOTE — Progress Notes (Signed)
[facility] - Rx #: [id number]  Available with out authorization,

## 2020-08-18 ENCOUNTER — Other Ambulatory Visit: Payer: Self-pay | Admitting: Neurology

## 2020-08-20 ENCOUNTER — Encounter: Payer: Self-pay | Admitting: Neurology

## 2020-08-20 ENCOUNTER — Ambulatory Visit (INDEPENDENT_AMBULATORY_CARE_PROVIDER_SITE_OTHER): Payer: PPO | Admitting: Neurology

## 2020-08-20 ENCOUNTER — Other Ambulatory Visit: Payer: Self-pay

## 2020-08-20 VITALS — BP 138/82 | HR 87 | Ht 66.0 in | Wt 263.8 lb

## 2020-08-20 DIAGNOSIS — G40211 Localization-related (focal) (partial) symptomatic epilepsy and epileptic syndromes with complex partial seizures, intractable, with status epilepticus: Secondary | ICD-10-CM

## 2020-08-20 NOTE — Progress Notes (Signed)
NEUROLOGY FOLLOW UP OFFICE NOTE  Gregg Young [id number] 1975/07/26  HISTORY OF PRESENT ILLNESS: I had the pleasure of seeing Gregg Young in follow-up in the neurology clinic on 08/20/2020.  The patient was last seen 3 months ago for intractable multifocal epilepsy s/p VNS placement, s/p DBS placement, on 6 AEDs with continued seizures. He was having an increase in seizures with falls with reduction in clobazam, dose increased back to 30mg  qhs on last visit. He is also on Epidiolex 32mL BID, Vimpat 150mg  BID, Levetiracetam 2000mg  BID, Lamotrigine 200mg  TID, and Pregabalin 225mg  TID. His VNS settings were changed on last visit, with off time reduced to 3 minutes. Since his last visit, he reports doing better. There have been no further falls since increasing clobazam dose 3 months ago. He had a small seizure last week. He is going to bed earlier and getting up earlier. He denies any headaches, dizziness. His mother reminds him he was dizzy at her house yesterday, she reports he is occasionally dizzy, which is also improved compared to prior. The right ptosis is better after eyelid surgery, he reports it is still drooping, worse in the mornings then open in the evenings. No diplopia, difficulty swallowing, muscle weakness.    Seizure History: This is a 45 yo RH man with a history of seizures since age 32. He has no recollection of events, no prior warning, witnessed by his mother to have a generalized convulsion lasting 90 to 120 seconds. He was brought to [facility] then had another convulsion 2 days later. They recall trying different medications, Depakote caused liver dysfunction, he has failed Dilantin and Zonegran. He has been on Keppra, Lamictal, Lyrica, and most recently Vimpat. He had an EMU admission at [facility], records unavailable for review, per notes he stayed for 36 hours and had generalized seizures, multiple foci. They report two admissions for status epilepticus, one in September 2002 in  the setting of weaning off Depakote, and another in January 2003. Records unavailable for review. His last GTC was around 5 years ago. He continued to have "petit mals" several times a day where his eyes would roll back, hands would shake for 30-45 seconds, if standing he would fall and had required sutures and staples in the past. He had been having the "petit mals" several times daily until 3 weeks ago when he started having a different type of episode and the petit mals "completely stopped." He was brought to the ER on 12/07/13 when his parents awoke to him dry having and speaking gibberish. He would be unable to speak, control his limbs, and cannot stand up without assistance. He can hear people around him but his jaw feels tight. The episodes can last for several hours. He has had 4 episodes in the past 3 weeks. The patient reports that they have been occurring only during the weekends, however his parents remind him he had one on Wednesday. He went to his neurologist's office on 09/10 where he had an episode while walking to the exam room where he became dizzy and fell. His father was able to catch him, and they reported his speech became affected. He was noted to be speaking in a whisper throughout the visit. He does note that he becomes more dizzy after taking his medications. Lamictal level was 17.8, Vimpat level 7.3.   The patient lives with his parents and brother. He is on disability and mostly stays at home playing video games. He graduated high school, no special  ed classes. He endorses olfactory hallucinations but cannot describe it except saying they are the same all the time.   Epilepsy Risk Factors: He had a skull fracture on the right side after a fall at 63 months of age. Otherwise he had a normal birth and early development. There is no history of febrile convulsions, CNS infections such as meningitis/encephalitis, neurosurgical procedures, or family history of seizures.  Prior  AEDs: Depakote, Dilantin, Zonegran  EEGs: 48-hour EEG (03/17/14 to 03/19/14) abnormal with multifocal discharges and right temporal slowing. Prior records note "generalized seizures, multiple foci" He had a 72-hour EEG (09/26/16 to 09/29/16) which was abnormal with bursts and runs of focal slowing over the right temporal region, bursts and runs of diffuse rhythmic slowing with intermixed generalized spikes lasting up to 120 seconds without clinical correlate, multifocal epileptiform discharges seen over the right temporal, left frontal regions, as well as generalized spikes and polyspikes. During sleep, there were bursts of generalized fast activity. There were 2 clinicoelectrographic seizures captured with right head turn and some hypermotor activity that were non-lateralizing on EEG with diffuse background suppression at seizure onset.  MRI: I personally reviewed MRI brain with and without contrast done 12/26/2013 which did not show any acute intracranial abnormality, hippocampi symmetric without abnormal signal or enhancement.  He has had an extensive evaluation at [facility] in 2019 with repeat MRI brain 07/2017 showing no clear abnormality, PET scan with questionable decreased uptake on the left, Neuropsych testing suggesting more right-sided dysfunction. He underwent sEEG which was non-lateralizing, broad activity at seizure onset with low voltage fast activity from left and right, insular origin also suspicious. He had DBS placed last 10/2017 for non-resective stimulation treatment.    PAST MEDICAL HISTORY: Past Medical History:  Diagnosis Date  . History of kidney stones   . Seizures (HCC)    daily    MEDICATIONS: Current Outpatient Medications on File Prior to Visit  Medication Sig Dispense Refill  . pregabalin (LYRICA) 225 MG capsule TAKE 1 CAPSULE BY MOUTH 3 TIMES DAILY 270 capsule 3  . cannabidiol (EPIDIOLEX) 100 MG/ML solution Take 60mL BID 540 mL 5  . cloBAZam (ONFI) 10 MG tablet Take 3  tabs at night 270 tablet 3  . lamoTRIgine (LAMICTAL) 100 MG tablet TAKE 2 TABLETS BY MOUTH 3 TIMES DAILY 180 tablet 11  . levETIRAcetam (KEPPRA) 1000 MG tablet TAKE 2 TABLETS BY MOUTH 2 TIMES DAILY 120 tablet 11  . VIMPAT 100 MG TABS TAKE ONE AND A HALF TABLETS BY MOUTH 2 TIMES DAILY 90 tablet 5   No current facility-administered medications on file prior to visit.    ALLERGIES: Allergies  Allergen Reactions  . Penicillins Rash    Has patient had a PCN reaction causing immediate rash, facial/tongue/throat swelling, SOB or lightheadedness with hypotension: No Has patient had a PCN reaction causing severe rash involving mucus membranes or skin necrosis: No Has patient had a PCN reaction that required hospitalization No Has patient had a PCN reaction occurring within the last 10 years: No If all of the above answers are "NO", then may proceed with Cephalosporin use.     FAMILY HISTORY: Family History  Problem Relation Age of Onset  . Healthy Mother   . Healthy Father     SOCIAL HISTORY: Social History   Socioeconomic History  . Marital status: Single    Spouse name: Not on file  . Number of children: 0  . Years of education: HS  . Highest education level: Not on  file  Occupational History  . Not on file  Tobacco Use  . Smoking status: Former Smoker    Packs/day: 0.50    Years: 4.00    Pack years: 2.00    Types: Cigarettes    Quit date: 03/03/1995    Years since quitting: 25.4  . Smokeless tobacco: Never Used  Vaping Use  . Vaping Use: Never used  Substance and Sexual Activity  . Alcohol use: No    Alcohol/week: 0.0 standard drinks  . Drug use: No  . Sexual activity: Not on file  Other Topics Concern  . Not on file  Social History Narrative   Patient is single and lives by his self  Single story just steps to get on front porch dose not use front steps.   Patient has a high school education.   Patient is right-handed.   Patient does not have any children.    Patient is on disability.   Patient drinks three sodas daily.   Social Determinants of Health   Financial Resource Strain: Not on file  Food Insecurity: Not on file  Transportation Needs: Not on file  Physical Activity: Not on file  Stress: Not on file  Social Connections: Not on file  Intimate Partner Violence: Not on file     PHYSICAL EXAM: Vitals:   08/20/20 1216  BP: 138/82  Pulse: 87  SpO2: 93%   General: No acute distress Head:  Normocephalic/atraumatic Skin/Extremities: No rash, no edema Neurological Exam: alert and awake. No aphasia or dysarthria. Fund of knowledge is appropriate.  Attention and concentration are normal.   Cranial nerves: Pupils equal, round. Right ptosis improved from prior, no fatigable ptosis. Extraocular movements intact with no nystagmus. Visual fields full.  No facial asymmetry.  Motor: Bulk and tone normal, muscle strength 5/5 throughout with no pronator drift.   Finger to nose testing intact.  Gait narrow-based and steady, no ataxia.   IMPRESSION: This is a 45 yo RH man with intractable multifocal epilepsy. Family reports seizures started at age 45. He had an initial excellent response to initiation of Epidiolex, having multiple daily seizures to going up to 2 months seizure-free. He was having drowsiness due to polypharmacy, however with reduction in clobazam dose, he started having an increase in seizures with significant falls. He has had no further falls with increase back to clobazam 30mg  qhs. He is on 6 ASMs s/p VNS and DBS, we may attempt to wean Pregabalin on his next visit. They will let know if refills are needed for Epidiolex 58mL BID, Vimpat 150mg  BID, Levetiracetam 2000mg  BID, Lamotrigine 200mg  TID, and Pregabalin 225mg  TID. Right ptosis improved some with eyelid surgery, myasthenia panel negative, CTA no evidence of aneurysm. He does not drive. Follow-up in 4 months, they know to call for any changes.   Thank you for allowing me to  participate in his care.  Please do not hesitate to call for any questions or concerns.   10m, M.D.   CC: Dr. 

## 2020-08-20 NOTE — Patient Instructions (Signed)
Looking good. Continue all your medications. Follow-up in 4 months, call for any changes.   Seizure Precautions: 1. If medication has been prescribed for you to prevent seizures, take it exactly as directed.  Do not stop taking the medicine without talking to your doctor first, even if you have not had a seizure in a long time.   2. Avoid activities in which a seizure would cause danger to yourself or to others.  Don't operate dangerous machinery, swim alone, or climb in high or dangerous places, such as on ladders, roofs, or girders.  Do not drive unless your doctor says you may.  3. If you have any warning that you may have a seizure, lay down in a safe place where you can't hurt yourself.    4.  No driving for 6 months from last seizure, as per [facility].   Please refer to the following link on the [employer]ile[employer] of [employer]'s website for more information: http://www.epilepsyfoundation.org/answerplace/Social/driving/drivingu.cfm   5.  Maintain good sleep hygiene.  6.  Contact your doctor if you have any problems that may be related to the medicine you are taking.  7.  Call 911 and bring the patient back to the ED if:        A.  The seizure lasts longer than 5 minutes.       B.  The patient doesn't awaken shortly after the seizure  C.  The patient has new problems such as difficulty seeing, speaking or moving  D.  The patient was injured during the seizure  E.  The patient has a temperature over 102 F (39C)  F.  The patient vomited and now is having trouble breathing

## 2020-09-01 ENCOUNTER — Other Ambulatory Visit: Payer: Self-pay | Admitting: Neurology

## 2020-09-01 DIAGNOSIS — G40211 Localization-related (focal) (partial) symptomatic epilepsy and epileptic syndromes with complex partial seizures, intractable, with status epilepticus: Secondary | ICD-10-CM

## 2020-11-02 ENCOUNTER — Other Ambulatory Visit: Payer: Self-pay | Admitting: Neurology

## 2020-12-28 ENCOUNTER — Ambulatory Visit (INDEPENDENT_AMBULATORY_CARE_PROVIDER_SITE_OTHER): Payer: PPO | Admitting: Neurology

## 2020-12-28 ENCOUNTER — Encounter: Payer: Self-pay | Admitting: Neurology

## 2020-12-28 ENCOUNTER — Other Ambulatory Visit: Payer: Self-pay

## 2020-12-28 VITALS — BP 104/69 | HR 83 | Ht 66.0 in | Wt 263.0 lb

## 2020-12-28 DIAGNOSIS — G40211 Localization-related (focal) (partial) symptomatic epilepsy and epileptic syndromes with complex partial seizures, intractable, with status epilepticus: Secondary | ICD-10-CM

## 2020-12-28 NOTE — Patient Instructions (Signed)
Always good to see you. Continue all your medications. Keep checking in with your sleep specialist on new options for sleep apnea. Follow-up in 6 months, call for any changes.   Seizure Precautions: 1. If medication has been prescribed for you to prevent seizures, take it exactly as directed.  Do not stop taking the medicine without talking to your doctor first, even if you have not had a seizure in a long time.   2. Avoid activities in which a seizure would cause danger to yourself or to others.  Don't operate dangerous machinery, swim alone, or climb in high or dangerous places, such as on ladders, roofs, or girders.  Do not drive unless your doctor says you may.  3. If you have any warning that you may have a seizure, lay down in a safe place where you can't hurt yourself.    4.  No driving for 6 months from last seizure, as per [facility].   Please refer to the following link on the [employer]ile[employer] of [employer]'s website for more information: http://www.epilepsyfoundation.org/answerplace/Social/driving/drivingu.cfm   5.  Maintain good sleep hygiene.  6.  Contact your doctor if you have any problems that may be related to the medicine you are taking.  7.  Call 911 and bring the patient back to the ED if:        A.  The seizure lasts longer than 5 minutes.       B.  The patient doesn't awaken shortly after the seizure  C.  The patient has new problems such as difficulty seeing, speaking or moving  D.  The patient was injured during the seizure  E.  The patient has a temperature over 102 F (39C)  F.  The patient vomited and now is having trouble breathing
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
## 2021-01-04 ENCOUNTER — Other Ambulatory Visit: Payer: Self-pay | Admitting: Neurology

## 2021-02-12 ENCOUNTER — Encounter: Payer: Self-pay | Admitting: Neurology

## 2021-03-10 ENCOUNTER — Other Ambulatory Visit: Payer: Self-pay | Admitting: Neurology

## 2021-03-10 DIAGNOSIS — G40211 Localization-related (focal) (partial) symptomatic epilepsy and epileptic syndromes with complex partial seizures, intractable, with status epilepticus: Secondary | ICD-10-CM

## 2021-03-15 ENCOUNTER — Telehealth: Payer: Self-pay | Admitting: Neurology

## 2021-03-15 NOTE — Telephone Encounter (Signed)
Gregg Young did fine missing one dose, they picked up his onfi and he is back on regular schedule

## 2021-04-19 ENCOUNTER — Other Ambulatory Visit: Payer: Self-pay | Admitting: Neurology

## 2021-04-19 MED ORDER — LACOSAMIDE 100 MG PO TABS: ORAL_TABLET | ORAL | 5 refills | Status: DC

## 2021-04-21 ENCOUNTER — Telehealth: Payer: Self-pay

## 2021-04-21 NOTE — Telephone Encounter (Signed)
Pt Lacosamide 100 mg  needs a PA

## 2021-04-21 NOTE — Telephone Encounter (Signed)
-----   Message from Van Clines, MD sent at 04/21/2021 10:57 AM EST ----- Can you pls check which med this is? The operator misspelled lol, thanks  ----- Message ----- From: Jonetta Osgood Sent: 04/21/2021   8:12 AM EST To: Van Clines, MD

## 2021-04-21 NOTE — Telephone Encounter (Signed)
Thanks [employer]. I sent the prescription on 04/19/21, he is on 5 seizure medications in addition to VNS and DBS with continued seizures. He requires to be on this medication otherwise he was in status epilepticus in the past due to worsened seizures. Thanks

## 2021-04-23 ENCOUNTER — Telehealth: Payer: Self-pay

## 2021-04-23 NOTE — Telephone Encounter (Deleted)
F/u   Gregg Young Key: [id number] help? Call us at [phone]  Outcome Additional Information Required  Prior Authorization duplicate/in progress  Drug Vimpat 100MG  tablets  Form [facility] [employer] Electronic Prior Authorization Form 2017 NCPDP

## 2021-04-23 NOTE — Telephone Encounter (Signed)
New message   [employer] is unable to respond with clinical questions. Please see more information at the bottom of the page for next steps.  Wynelle Cleveland Key: [id number] help? Call us at [phone]  Outcome Additional Information Required  Prior Authorization duplicate/in progress  Drug Vimpat 100MG  tablets Form [employer] [employer] Medicare Electronic Prior Authorization Form 2017 NCPDP

## 2021-04-28 NOTE — Telephone Encounter (Addendum)
F/u - 2023 insurance card is not upload to patient account - call patient spoke with mom at  [phone] current insurance   Gregg Young (Key: [id number])Need help? Call us at [phone] Outcome N/A Electronic Prior Authorization not supported. Submit via other methods Next Steps The plan will fax you a determination, typically within 1 to 5 business days.  How do I follow up? Drug Lacosamide 100MG  tablets Form [employer] [employer] Medicare Coverage Determination Form Prior Authorization for [place] [employer] Medicare Members. [phone] [phone]

## 2021-04-28 NOTE — Telephone Encounter (Signed)
Patients mother called wanting to know if the prior authorization for Vimpat was complete.

## 2021-04-29 ENCOUNTER — Other Ambulatory Visit: Payer: Self-pay

## 2021-04-29 ENCOUNTER — Telehealth: Payer: Self-pay | Admitting: Neurology

## 2021-04-29 DIAGNOSIS — G40211 Localization-related (focal) (partial) symptomatic epilepsy and epileptic syndromes with complex partial seizures, intractable, with status epilepticus: Secondary | ICD-10-CM

## 2021-04-29 MED ORDER — EPIDIOLEX 100 MG/ML PO SOLN: ORAL | 5 refills | Status: DC

## 2021-04-29 NOTE — Telephone Encounter (Signed)
F/u - 2023 not upload to patient account - call patient spoke with mom at  [phone] current insurance information was given over the phone. Mom advise she will be coming by the office to have the front desk upload the new insurance card     Gregg Young (Key: [id number])Need help? Call us at [phone] Outcome N/A Electronic Prior Authorization not supported. Submit via other methods Next Steps The plan will fax you a determination, typically within 1 to 5 business days.   How do I follow up? Drug Lacosamide 100MG  tablets Form [employer] Medicare Coverage Determination Form Prior Authorization for [employer] [employer] Medicare Members. [phone] [phone]

## 2021-04-29 NOTE — Telephone Encounter (Signed)
I called [phone] and spoke with Lokesha to check on the status of the prior authorization sent yesterday.   The Prior Authorization was received.     Per Lokesha, A decision will be made hopefully by tomorrow if anything else is needed they will reach out to the office.   F/u  Website CoverMyMeds  Hale Banos (Key: [id number])Need help? Call us at [phone]  Outcome N/[employer] Prior Authorization not supported. Submit via other methods  Next Steps The plan will fax you a determination, typically within 1 to 5 business days.  How do I follow up? Drug Lacosamide 100MG tablets.  Form [employer] Medicare Coverage Determination Form Prior Authorization for [employer] [employer] Medicare Members. [phone] [phone] 

## 2021-04-29 NOTE — Telephone Encounter (Signed)
F/u  Received fax  from [facility] [employer]   Approval medication Lacosamide 100 mg tablet  Effective date  1.26.23  to  12.31.23

## 2021-04-29 NOTE — Telephone Encounter (Signed)
I called [phone] and spoke with Gregg Young to check on the status of the prior authorization sent yesterday.   The Prior Authorization was received.     Per Gregg Young, A decision will be made hopefully by tomorrow if anything else is needed they will reach out to the office.   F/u  Website CoverMyMeds  Gregg Young (Key: [id number])Need help? Call us at [phone]  Outcome N/AElectronic Prior Authorization not supported. Submit via other methods  Next Steps The plan will fax you a determination, typically within 1 to 5 business days.  How do I follow up? Drug Lacosamide 100MG  tablets.  Form [employer] Medicare Coverage Determination Form Prior Authorization for [employer] [employer] Medicare Members. [phone] [phone]

## 2021-04-29 NOTE — Telephone Encounter (Signed)
Mother called asking if the prior authorization has been done for his medication Vimpat?  She also stated that he needs refill authorization for the medication Epidiolex.

## 2021-04-30 ENCOUNTER — Telehealth: Payer: Self-pay | Admitting: Neurology

## 2021-04-30 NOTE — Telephone Encounter (Signed)
Pt's mother called in wanting to speak with someone about the prior authorization for the pt's Vimpat

## 2021-05-03 NOTE — Telephone Encounter (Signed)
April 29, 2021 Me   GD    4:20 PM Note F/u   Received fax  from [facility] [employer]    Approval medication Lacosamide 100 mg tablet   Effective date  1.26.23  to  12.31.23

## 2021-05-04 ENCOUNTER — Other Ambulatory Visit: Payer: Self-pay | Admitting: Neurology

## 2021-05-07 ENCOUNTER — Telehealth: Payer: Self-pay | Admitting: Neurology

## 2021-05-07 NOTE — Telephone Encounter (Signed)
Patients mom called and stated there is a weather delay for Gregg Young medicine Epidiolex.  It will not get to them until tues.  He is going to run out of this before then.  Is there anything they can do because of this as far as any of his other medication goes?

## 2021-05-07 NOTE — Telephone Encounter (Signed)
Called Gregg Young no answer left a voice mail to call office back

## 2021-05-07 NOTE — Telephone Encounter (Signed)
Is this something Epidiolex [employer] can help with? Can you pls contact Knute Neu again [phone] She emailed me before for urgent supply,thanks

## 2021-05-10 NOTE — Telephone Encounter (Signed)
Called [place] pharmacy on Friday pt mother could pick up emergency supply of medication, Dr Karel Jarvis talked to pt mother informed her that she could get them medication pt mother was not in town

## 2021-06-10 ENCOUNTER — Other Ambulatory Visit: Payer: Self-pay | Admitting: Neurology

## 2021-06-10 DIAGNOSIS — G40211 Localization-related (focal) (partial) symptomatic epilepsy and epileptic syndromes with complex partial seizures, intractable, with status epilepticus: Secondary | ICD-10-CM

## 2021-07-13 ENCOUNTER — Ambulatory Visit: Payer: PPO | Admitting: Neurology

## 2021-07-23 ENCOUNTER — Encounter: Payer: Self-pay | Admitting: Surgery

## 2021-07-23 ENCOUNTER — Ambulatory Visit: Payer: PPO

## 2021-07-23 ENCOUNTER — Ambulatory Visit: Payer: PPO | Admitting: Neurology

## 2021-07-23 ENCOUNTER — Ambulatory Visit (INDEPENDENT_AMBULATORY_CARE_PROVIDER_SITE_OTHER): Payer: PPO | Admitting: Surgery

## 2021-07-23 VITALS — BP 119/80 | HR 76 | Ht 66.0 in | Wt 263.0 lb

## 2021-07-23 DIAGNOSIS — M545 Low back pain, unspecified: Secondary | ICD-10-CM

## 2021-07-27 ENCOUNTER — Ambulatory Visit (INDEPENDENT_AMBULATORY_CARE_PROVIDER_SITE_OTHER): Payer: PPO | Admitting: Neurology

## 2021-07-27 ENCOUNTER — Other Ambulatory Visit (INDEPENDENT_AMBULATORY_CARE_PROVIDER_SITE_OTHER): Payer: PPO

## 2021-07-27 ENCOUNTER — Encounter: Payer: Self-pay | Admitting: Neurology

## 2021-07-27 VITALS — BP 139/76 | HR 72 | Ht 66.0 in | Wt 267.0 lb

## 2021-07-27 DIAGNOSIS — F32A Depression, unspecified: Secondary | ICD-10-CM | POA: Diagnosis not present

## 2021-07-27 DIAGNOSIS — G40211 Localization-related (focal) (partial) symptomatic epilepsy and epileptic syndromes with complex partial seizures, intractable, with status epilepticus: Secondary | ICD-10-CM

## 2021-07-27 MED ORDER — EPIDIOLEX 100 MG/ML PO SOLN: ORAL | 5 refills | Status: DC

## 2021-07-27 MED ORDER — ESCITALOPRAM OXALATE 10 MG PO TABS: 10.0000 mg | ORAL_TABLET | Freq: Every day | ORAL | 6 refills | Status: DC

## 2021-07-27 NOTE — Patient Instructions (Addendum)
Good to see you. ? ?Bloodwork for CBC, CMP ? ?2. Increase Epidiolex to 62mL twice a day ? ?3. Start Lexapro 10mg  daily ? ?4. Continue all your other medications ? ?5. Follow-up in 4-5 months, call for any changes ? ? ?Seizure Precautions: ?1. If medication has been prescribed for you to prevent seizures, take it exactly as directed.  Do not stop taking the medicine without talking to your doctor first, even if you have not had a seizure in a long time.  ? ?2. Avoid activities in which a seizure would cause danger to yourself or to others.  Don't operate dangerous machinery, swim alone, or climb in high or dangerous places, such as on ladders, roofs, or girders.  Do not drive unless your doctor says you may. ? ?3. If you have any warning that you may have a seizure, lay down in a safe place where you can't hurt yourself.   ? ?4.  No driving for 6 months from last seizure, as per [facility].   Please refer to the following link on the [place] website for more information: http://www.epilepsyfoundation.org/answerplace/Social/driving/drivingu.cfm  ? ?5.  Maintain good sleep hygiene. ? ?6.  Contact your doctor if you have any problems that may be related to the medicine you are taking. ? ?7.  Call 911 and bring the patient back to the ED if: ?      ? A.  The seizure lasts longer than 5 minutes.      ? B.  The patient doesn't awaken shortly after the seizure ? C.  The patient has new problems such as difficulty seeing, speaking or moving ? D.  The patient was injured during the seizure ? E.  The patient has a temperature over 102 F (39C) ? F.  The patient vomited and now is having trouble breathing ?      ? ?

## 2021-07-27 NOTE — Progress Notes (Signed)
? ?NEUROLOGY FOLLOW UP OFFICE NOTE ? ?Gregg FieldRussell A Young ?[id number] ?November 03, 1975 ? ?HISTORY OF PRESENT ILLNESS: ?I had the pleasure of seeing Gregg Young in follow-up in the neurology clinic on 07/27/2021.  The patient was last seen 7 months ago for intractable multifocal epilepsy s/p VNS placement, s/p DBS placement, on 6 ASMs with continued seizures. He is again accompanied by his mother who spoke to me privately before the visit to report concern for depression and near-daily falls due to seizures today. He has been back home for the past 3-4 weeks. He misses his apartment. He states he is feeling well. His mother reports that he may go 1-2 days without a fall but has daily seizures. No missed medications. Last fall was last night. He was seen by Ortho for back pain after a fall, he reports it is better. No neck pain. He has occasional paresthesias in his fingers. His mother reports his OSA is "horrible," he sleeps on his stomach. Bedtime is at 1-2am, he wakes up at 12-1pm. He reports taking naps sometimes, his mother reports he gets up to eat, sits and goes back to sleep. He stays at home and plays video games. Appetite is okay, no swallowing difficulties. His mother reports the right eye stays closed despite having eyelid surgery. He is able to voluntarily open the right eyelid. His mother privately expressed concern about depression, she is really afraid he will do something to himself. He reports mood is okay. He admits to low energy. He continues on clobazam 30mg  qhs (attempt to reduce dose cause increase in seizures with falls), Epidiolex 9mL BID, Vimpat 150mg  BID, Levetiracetam 2000mg  BID, Lamotrigine 200mg  TID, and Pregabalin 225mg  TID.  ? ? ?Seizure History: This is a 46 yo RH man with a history of seizures since age 46. He has no recollection of events, no prior warning, witnessed by his mother to have a generalized convulsion lasting 90 to 120 seconds. He was [facility] then had another convulsion 2  days later. They recall trying different medications, Depakote caused liver dysfunction, he has failed Dilantin and Zonegran. He has been on Keppra, Lamictal, Lyrica, and most recently Vimpat.  He had [facility], records unavailable for review, per notes he stayed for 36 hours and had generalized seizures, multiple foci.  They report two admissions for status epilepticus, one in September 2002 in the setting of weaning off Depakote, and another in January 2003.  Records unavailable for review.  His last GTC was around 5 years ago. He continued to have "petit mals" several times a day where his eyes would roll back, hands would shake for 30-45 seconds, if standing he would fall and had required sutures and staples in the past. He had been having the "petit mals" several times daily until 3 weeks ago when he started having a different type of episode and the petit mals "completely stopped."  He was brought to the ER on 12/07/13 when his parents awoke to him dry having and speaking gibberish. He would be unable to speak, control his limbs, and cannot stand up without assistance. He can hear people around him but his jaw feels tight. The episodes can last for several hours. He has had 4 episodes in the past 3 weeks.  The patient reports that they have been occurring only during the weekends, however his parents remind him he had one on Wednesday. He went to his neurologist's office on 09/10 where he had an episode while  walking to the exam room where he became dizzy and fell.  His father was able to catch him, and they reported his speech became affected. He was noted to be speaking in a whisper throughout the visit.  He does note that he becomes more dizzy after taking his medications.  Lamictal level was 17.8, Vimpat level 7.3.  ? ?The patient lives with his parents and brother. He is on disability and mostly stays at home playing video games. He graduated high school, no special ed classes. He  endorses olfactory hallucinations but cannot describe it except saying they are the same all the time.  ? ?Epilepsy Risk Factors:  He had a skull fracture on the right side after a fall at 75 months of age. Otherwise he had a normal birth and early development.  There is no history of febrile convulsions, CNS infections such as meningitis/encephalitis, neurosurgical procedures, or family history of seizures. ? ?Prior AEDs: Depakote, Dilantin, Zonegran ? ?EEGs: 48-hour EEG (03/17/14 to 03/19/14) abnormal with multifocal discharges and right temporal slowing. Prior records note "generalized seizures, multiple foci" ?He had a 72-hour EEG (09/26/16 to 09/29/16) which was abnormal with bursts and runs of focal slowing over the right temporal region, bursts and runs of diffuse rhythmic slowing with intermixed generalized spikes lasting up to 120 seconds without clinical correlate, multifocal epileptiform discharges seen over the right temporal, left frontal regions, as well as generalized spikes and polyspikes. During sleep, there were bursts of generalized fast activity. There were 2 clinicoelectrographic seizures captured with right head turn and some hypermotor activity that were non-lateralizing on EEG with diffuse background suppression at seizure onset. ? ?MRI: I personally reviewed MRI brain with and without contrast done 12/26/2013 which did not show any acute intracranial abnormality, hippocampi symmetric without abnormal signal or enhancement. ? ?He has had an extensive evaluation at [facility] in 2019 with repeat MRI brain 07/2017 showing no clear abnormality, PET scan with questionable decreased uptake on the left, Neuropsych testing suggesting more right-sided dysfunction. He underwent sEEG which was non-lateralizing, broad activity at seizure onset with low voltage fast activity from left and right, insular origin also suspicious. He had DBS placed last 10/2017 for non-resective stimulation treatment.  ? ? ?PAST  MEDICAL HISTORY: ?Past Medical History:  ?Diagnosis Date  ? History of kidney stones   ? Seizures (HCC)   ? daily  ? ? ?MEDICATIONS: ?Current Outpatient Medications on File Prior to Visit  ?Medication Sig Dispense Refill  ? cloBAZam (ONFI) 10 MG tablet TAKE 3 TABLETS BY MOUTH NIGHTLY 180 tablet 1  ? pregabalin (LYRICA) 225 MG capsule TAKE 1 CAPSULE BY MOUTH 3 TIMES DAILY 270 capsule 3  ? cannabidiol (EPIDIOLEX) 100 MG/ML solution Take 58mL BID 540 mL 5  ? Lacosamide (VIMPAT) 100 MG TABS Take one and 1/2 tablets twice a day 90 tablet 5  ? lamoTRIgine (LAMICTAL) 100 MG tablet TAKE 2 TABLETS BY MOUTH 3 TIMES DAILY 180 tablet 11  ? levETIRAcetam (KEPPRA) 1000 MG tablet TAKE 2 TABLETS BY MOUTH 2 TIMES DAILY 120 tablet 11  ? ?No current facility-administered medications on file prior to visit.  ? ? ?ALLERGIES: ?Allergies  ?Allergen Reactions  ? Penicillins Rash  ?  Has patient had a PCN reaction causing immediate rash, facial/tongue/throat swelling, SOB or lightheadedness with hypotension: No ?Has patient had a PCN reaction causing severe rash involving mucus membranes or skin necrosis: No ?Has patient had a PCN reaction that required hospitalization No ?Has patient had a PCN reaction  occurring within the last 10 years: No ?If all of the above answers are "NO", then may proceed with Cephalosporin use. ?  ? ? ?FAMILY HISTORY: ?Family History  ?Problem Relation Age of Onset  ? Healthy Mother   ? Healthy Father   ? ? ?SOCIAL HISTORY: ?Social History  ? ?Socioeconomic History  ? Marital status: Single  ?  Spouse name: Not on file  ? Number of children: 0  ? Years of education: HS  ? Highest education level: Not on file  ?Occupational History  ? Not on file  ?Tobacco Use  ? Smoking status: Former  ?  Packs/day: 0.50  ?  Years: 4.00  ?  Pack years: 2.00  ?  Types: Cigarettes  ?  Quit date: 03/03/1995  ?  Years since quitting: 26.4  ? Smokeless tobacco: Never  ?Vaping Use  ? Vaping Use: Never used  ?Substance and Sexual Activity   ? Alcohol use: No  ?  Alcohol/week: 0.0 standard drinks  ? Drug use: No  ? Sexual activity: Not on file  ?Other Topics Concern  ? Not on file  ?Social History Narrative  ? Patient is single and lives by his s

## 2021-07-28 LAB — COMPREHENSIVE METABOLIC PANEL
ALT: 21 U/L (ref 0–53)
AST: 17 U/L (ref 0–37)
Albumin: 4.6 g/dL (ref 3.5–5.2)
Alkaline Phosphatase: 89 U/L (ref 39–117)
BUN: 15 mg/dL (ref 6–23)
CO2: 28 mEq/L (ref 19–32)
Calcium: 9.3 mg/dL (ref 8.4–10.5)
Chloride: 98 mEq/L (ref 96–112)
Creatinine, Ser: 0.88 mg/dL (ref 0.40–1.50)
GFR: 102.7 mL/min (ref >60.00)
Glucose, Bld: 85 mg/dL (ref 70–99)
Potassium: 4.4 mEq/L (ref 3.5–5.1)
Sodium: 133 mEq/L — ABNORMAL LOW (ref 135–145)
Total Bilirubin: 0.5 mg/dL (ref 0.2–1.2)
Total Protein: 6.9 g/dL (ref 6.0–8.3)

## 2021-07-28 LAB — CBC
HCT: 38.9 % — ABNORMAL LOW (ref 39.0–52.0)
Hemoglobin: 13.2 g/dL (ref 13.0–17.0)
MCHC: 33.9 g/dL (ref 30.0–36.0)
MCV: 97.2 fl (ref 78.0–100.0)
Platelets: 296 10*3/uL (ref 150.0–400.0)
RBC: 4 Mil/uL — ABNORMAL LOW (ref 4.22–5.81)
RDW: 12.9 % (ref 11.5–15.5)
WBC: 5 10*3/uL (ref 4.0–10.5)

## 2021-08-02 ENCOUNTER — Encounter: Payer: Self-pay | Admitting: Neurology

## 2021-08-12 ENCOUNTER — Other Ambulatory Visit: Payer: Self-pay | Admitting: Neurology

## 2021-08-13 ENCOUNTER — Ambulatory Visit (INDEPENDENT_AMBULATORY_CARE_PROVIDER_SITE_OTHER): Payer: PPO | Admitting: Neurology

## 2021-08-13 ENCOUNTER — Encounter: Payer: Self-pay | Admitting: Neurology

## 2021-08-13 VITALS — BP 118/78 | HR 91 | Ht 66.0 in | Wt 262.0 lb

## 2021-08-13 DIAGNOSIS — G40211 Localization-related (focal) (partial) symptomatic epilepsy and epileptic syndromes with complex partial seizures, intractable, with status epilepticus: Secondary | ICD-10-CM | POA: Diagnosis not present

## 2021-08-13 DIAGNOSIS — G4733 Obstructive sleep apnea (adult) (pediatric): Secondary | ICD-10-CM | POA: Diagnosis not present

## 2021-08-13 DIAGNOSIS — R296 Repeated falls: Secondary | ICD-10-CM | POA: Diagnosis not present

## 2021-08-13 MED ORDER — CLOBAZAM 10 MG PO TABS: ORAL_TABLET | ORAL | 1 refills | Status: DC

## 2021-08-13 MED ORDER — EPIDIOLEX 100 MG/ML PO SOLN: ORAL | 5 refills | Status: DC

## 2021-08-13 NOTE — Progress Notes (Signed)
? ?NEUROLOGY FOLLOW UP OFFICE NOTE ? ?DESHAY BLUMENFELD ?[id number] ?20-Nov-1975 ? ?HISTORY OF PRESENT ILLNESS: ?I had the pleasure of seeing Dayne Chait in follow-up in the neurology clinic on 08/13/2021.  The patient was last seen over 2 weeks ago for intractable epilepsy. He is accompanied by both parents today to help supplement the history. He presents today for an urgent visit due to extreme sleepiness (around 15 hours a day), disorientation, slurred speech. On his last visit, his mother reported again an increase in seizures leading to falls, and Epidiolex dose was increased from 19mL BID to 35mL BID. His mother contacted [place] that he continued to fall, usually in the early evening and asked if he could split up his Epidiolex dosing, advised to take 18mL TID which she has not started yet. He continues to have frequent falls from seizures, he fell in the dentist parking lot and has an abrasion on the left nasal region. His mother shows pictures of broken blinds and a door frame that cracked from his falls. He reports dizziness where he cannot stand up and walk (balance changes), no vertigo, headaches. His mother continues to be concerned about his right eye staying closed most of the time. He has severe sleep apnea but does not want to wear the CPAP mask. He is taking clobazam 30mg  qhs, Epidiolex 13mL BID, Vimpat 150mg  BID, Levetiracetam 2000mg  BID, Lamotrigine 200mg  TID, and Pregabalin 225mg  TID.  ? ? ?Seizure History: This is a 46 yo RH man with a history of seizures since age 9. He has no recollection of events, no prior warning, witnessed by his mother to have a generalized convulsion lasting 90 to 120 seconds. He was brought to [facility] then had another convulsion 2 days later. They recall trying different medications, Depakote caused liver dysfunction, he has failed Dilantin and Zonegran. He has been on Keppra, Lamictal, Lyrica, and most recently Vimpat.  He had an EMU admission at [facility], records unavailable  for review, per notes he stayed for 36 hours and had generalized seizures, multiple foci.  They report two admissions for status epilepticus, one in September 2002 in the setting of weaning off Depakote, and another in January 2003.  Records unavailable for review.  His last GTC was around 5 years ago. He continued to have "petit mals" several times a day where his eyes would roll back, hands would shake for 30-45 seconds, if standing he would fall and had required sutures and staples in the past. He had been having the "petit mals" several times daily until 3 weeks ago when he started having a different type of episode and the petit mals "completely stopped."  He was brought to the ER on 12/07/13 when his parents awoke to him dry having and speaking gibberish. He would be unable to speak, control his limbs, and cannot stand up without assistance. He can hear people around him but his jaw feels tight. The episodes can last for several hours. He has had 4 episodes in the past 3 weeks.  The patient reports that they have been occurring only during the weekends, however his parents remind him he had one on Wednesday. He went to his neurologist's office on 09/10 where he had an episode while walking to the exam room where he became dizzy and fell.  His father was able to catch him, and they reported his speech became affected. He was noted to be speaking in a whisper throughout the visit.  He does note that he  becomes more dizzy after taking his medications.  Lamictal level was 17.8, Vimpat level 7.3.  ? ?The patient lives with his parents and brother. He is on disability and mostly stays at home playing video games. He graduated high school, no special ed classes. He endorses olfactory hallucinations but cannot describe it except saying they are the same all the time.  ? ?Epilepsy Risk Factors:  He had a skull fracture on the right side after a fall at 47 months of age. Otherwise he had a normal birth and early  development.  There is no history of febrile convulsions, CNS infections such as meningitis/encephalitis, neurosurgical procedures, or family history of seizures. ? ?Prior AEDs: Depakote, Dilantin, Zonegran ? ?EEGs: 48-hour EEG (03/17/14 to 03/19/14) abnormal with multifocal discharges and right temporal slowing. Prior records note "generalized seizures, multiple foci" ?He had a 72-hour EEG (09/26/16 to 09/29/16) which was abnormal with bursts and runs of focal slowing over the right temporal region, bursts and runs of diffuse rhythmic slowing with intermixed generalized spikes lasting up to 120 seconds without clinical correlate, multifocal epileptiform discharges seen over the right temporal, left frontal regions, as well as generalized spikes and polyspikes. During sleep, there were bursts of generalized fast activity. There were 2 clinicoelectrographic seizures captured with right head turn and some hypermotor activity that were non-lateralizing on EEG with diffuse background suppression at seizure onset. ? ?MRI: I personally reviewed MRI brain with and without contrast done 12/26/2013 which did not show any acute intracranial abnormality, hippocampi symmetric without abnormal signal or enhancement. ? ?He has had an extensive evaluation at [facility] 2019 with repeat MRI brain 07/2017 showing no clear abnormality, PET scan with questionable decreased uptake on the left, Neuropsych testing suggesting more right-sided dysfunction. He underwent sEEG which was non-lateralizing, broad activity at seizure onset with low voltage fast activity from left and right, insular origin also suspicious. He had DBS placed last 10/2017 for non-resective stimulation treatment.  ? ? ?PAST MEDICAL HISTORY: ?Past Medical History:  ?Diagnosis Date  ? History of kidney stones   ? Seizures (HCC)   ? daily  ? ? ?MEDICATIONS: ?Current Outpatient Medications on File Prior to Visit  ?Medication Sig Dispense Refill  ? cannabidiol (EPIDIOLEX) 100  MG/ML solution Take 12mL BID 720 mL 5  ? cloBAZam (ONFI) 10 MG tablet TAKE 3 TABLETS BY MOUTH NIGHTLY 180 tablet 1  ? escitalopram (LEXAPRO) 10 MG tablet Take 1 tablet (10 mg total) by mouth daily. 30 tablet 6  ? Lacosamide (VIMPAT) 100 MG TABS Take one and 1/2 tablets twice a day 90 tablet 5  ? lamoTRIgine (LAMICTAL) 100 MG tablet TAKE 2 TABLETS BY MOUTH 3 TIMES DAILY 180 tablet 11  ? levETIRAcetam (KEPPRA) 1000 MG tablet TAKE 2 TABLETS BY MOUTH 2 TIMES DAILY 120 tablet 11  ? pregabalin (LYRICA) 225 MG capsule TAKE 1 CAPSULE BY MOUTH 3 TIMES DAILY 270 capsule 3  ? ?No current facility-administered medications on file prior to visit.  ? ? ?ALLERGIES: ?Allergies  ?Allergen Reactions  ? Penicillins Rash  ?  Has patient had a PCN reaction causing immediate rash, facial/tongue/throat swelling, SOB or lightheadedness with hypotension: No ?Has patient had a PCN reaction causing severe rash involving mucus membranes or skin necrosis: No ?Has patient had a PCN reaction that required hospitalization No ?Has patient had a PCN reaction occurring within the last 10 years: No ?If all of the above answers are "NO", then may proceed with Cephalosporin use. ?  ? ? ?  FAMILY HISTORY: ?Family History  ?Problem Relation Age of Onset  ? Healthy Mother   ? Healthy Father   ? ? ?SOCIAL HISTORY: ?Social History  ? ?Socioeconomic History  ? Marital status: Single  ?  Spouse name: Not on file  ? Number of children: 0  ? Years of education: HS  ? Highest education level: Not on file  ?Occupational History  ? Not on file  ?Tobacco Use  ? Smoking status: Former  ?  Packs/day: 0.50  ?  Years: 4.00  ?  Pack years: 2.00  ?  Types: Cigarettes  ?  Quit date: 03/03/1995  ?  Years since quitting: 26.4  ? Smokeless tobacco: Never  ?Vaping Use  ? Vaping Use: Never used  ?Substance and Sexual Activity  ? Alcohol use: No  ?  Alcohol/week: 0.0 standard drinks  ? Drug use: No  ? Sexual activity: Not on file  ?Other Topics Concern  ? Not on file  ?Social  History Narrative  ? Patient is single and lives by his self  Single story just steps to get on front porch dose not use front steps.  ? Patient has a high school education.  ? Patient is right-handed.  ? Patient d

## 2021-08-13 NOTE — Patient Instructions (Addendum)
Sorry to hear all this. ? ?Schedule head CT without contrast ? ?2. Reduce Onfi (clobazam) 10mg : Take 2 tablets every night. If no change in drowsiness in 2 weeks, we can reduce to 1 tablet every night ? ?3. Take the Epidiolex 46mL three times a day ? ?4. Referral will be sent to Sleep Medicine ? ?5. Proceed with follow-up at [facility] next month ? ?6. Continue all your medications ? ?7. Follow-up as scheduled in October, call for any changes ?

## 2021-08-17 ENCOUNTER — Ambulatory Visit: Payer: PPO | Admitting: Orthopaedic Surgery

## 2021-08-18 ENCOUNTER — Telehealth: Payer: Self-pay | Admitting: Neurology

## 2021-08-18 NOTE — Telephone Encounter (Signed)
Pt's mother called in stating the facility for his CT scan told her the pt's VNS needs to be turned off. She is not sure how to do that. The CT scan is scheduled for 09/08/21. ?

## 2021-08-19 NOTE — Telephone Encounter (Signed)
Spoke to pt mother informed her that VNS does not need to be turned off for CT scans, only for MRI. Looks like he is scheduled for the head CT with [facility] downstairs, if they [facility] needs info, VNS rep said:   Section 1.5.3.4 under routine diagnostics state that "Routine diagnostics" should not affect system operation. It says radiography which should cover CT scans. They can call clinical technical department if they need further clarification at [phone].   If they give them any trouble she will have them call us to get the number to call the rep

## 2021-08-19 NOTE — Telephone Encounter (Signed)
Pls let mother know that VNS does not need to be turned off for CT scans, only for MRI. Looks like he is scheduled for the head CT with [facility] downstairs, if they [facility] needs info, VNS rep said:  Section 1.5.3.4 under routine diagnostics state that "Routine diagnostics" should not affect system operation. It says radiography which should cover CT scans. They can call clinical technical department if they need further clarification at [phone]. Thanks

## 2021-08-21 ENCOUNTER — Encounter: Payer: Self-pay | Admitting: Neurology

## 2021-08-23 ENCOUNTER — Telehealth: Payer: Self-pay | Admitting: Neurology

## 2021-08-23 DIAGNOSIS — G40211 Localization-related (focal) (partial) symptomatic epilepsy and epileptic syndromes with complex partial seizures, intractable, with status epilepticus: Secondary | ICD-10-CM

## 2021-08-23 MED ORDER — CLOBAZAM 10 MG PO TABS: ORAL_TABLET | ORAL | 1 refills | Status: DC

## 2021-08-23 NOTE — Telephone Encounter (Signed)
Advised patient mother Mychart messae received, Once Dr.Aqunio get a chance she will response to it.

## 2021-08-23 NOTE — Telephone Encounter (Signed)
Patients mom Steward Drone called and stated she would like a return phone call about the new medication dosage for Tyjae.

## 2021-08-23 NOTE — Telephone Encounter (Signed)
Replied to MyChart message.

## 2021-08-31 ENCOUNTER — Encounter: Payer: Self-pay | Admitting: Neurology

## 2021-09-06 ENCOUNTER — Encounter: Payer: Self-pay | Admitting: Pulmonary Disease

## 2021-09-06 ENCOUNTER — Ambulatory Visit (INDEPENDENT_AMBULATORY_CARE_PROVIDER_SITE_OTHER): Payer: PPO | Admitting: Pulmonary Disease

## 2021-09-06 VITALS — BP 118/70 | HR 93 | Temp 98.1°F | Ht 66.0 in | Wt 256.0 lb

## 2021-09-06 DIAGNOSIS — G4739 Other sleep apnea: Secondary | ICD-10-CM | POA: Diagnosis not present

## 2021-09-06 DIAGNOSIS — G4719 Other hypersomnia: Secondary | ICD-10-CM

## 2021-09-06 DIAGNOSIS — R5383 Other fatigue: Secondary | ICD-10-CM | POA: Diagnosis not present

## 2021-09-06 NOTE — Progress Notes (Signed)
Gregg Young    [id number]    1975-07-10  Primary Care Physician:Hunter, Aldine Contes, MD  Referring Physician: Shelva Majestic, MD [address] [place],  [place] [address]  Chief complaint:   History of obstructive sleep apnea History of complex obstructive sleep apnea  HPI:  Has not used a CPAP in years In for further information regarding an inspire device  Current BMI is 41  Has lost some weight recently at the heaviest was in the 280s  Poor sleep hygiene  Usually goes to bed about 3 AM, will sleep till about 2 in the afternoon, does require to be woken up Stays up for few hours and then goes back to sleep  Has a history of uncontrolled epilepsy  Still has seizures on a daily basis On 5 different medications for his seizures  He has been titrated to a BiPAP with backup rate previously but this was never started Last used a CPAP over 6 years ago  Outpatient Encounter Medications as of 09/06/2021  Medication Sig   cannabidiol (EPIDIOLEX) 100 MG/ML solution Take 33mL three times a day   cloBAZam (ONFI) 10 MG tablet Take 1 tablet every night   escitalopram (LEXAPRO) 10 MG tablet Take 1 tablet (10 mg total) by mouth daily.   Lacosamide (VIMPAT) 100 MG TABS Take one and 1/2 tablets twice a day   lamoTRIgine (LAMICTAL) 100 MG tablet TAKE 2 TABLETS BY MOUTH 3 TIMES DAILY   levETIRAcetam (KEPPRA) 1000 MG tablet TAKE 2 TABLETS BY MOUTH 2 TIMES DAILY   pregabalin (LYRICA) 225 MG capsule TAKE 1 CAPSULE BY MOUTH 3 TIMES DAILY   No facility-administered encounter medications on file as of 09/06/2021.    Allergies as of 09/06/2021 - Review Complete 09/06/2021  Allergen Reaction Noted   Penicillins Rash 08/09/2011    Past Medical History:  Diagnosis Date   History of kidney stones    Seizures (HCC)    daily    Past Surgical History:  Procedure Laterality Date   EYE SURGERY     HERNIA REPAIR     baby   stimulator battery replacement  01/2020   @[facility]     VAGUS NERVE STIMULATOR INSERTION Left 03/10/2015   Procedure: LEFT SIDED PLACEMENT VAGAL NERVE STIMULATOR IMPLANT;  Surgeon: 14/09/2014, MD;  Location: [facility];  Service: Neurosurgery;  Laterality: Left;    Family History  Problem Relation Age of Onset   Healthy Mother    Healthy Father     Social History   Socioeconomic History   Marital status: Single    Spouse name: Not on file   Number of children: 0   Years of education: HS   Highest education level: Not on file  Occupational History   Not on file  Tobacco Use   Smoking status: Former    Packs/day: 0.50    Years: 4.00    Pack years: 2.00    Types: Cigarettes    Quit date: 03/03/1995    Years since quitting: 26.5   Smokeless tobacco: Never  Vaping Use   Vaping Use: Never used  Substance and Sexual Activity   Alcohol use: No    Alcohol/week: 0.0 standard drinks   Drug use: No   Sexual activity: Not on file  Other Topics Concern   Not on file  Social History Narrative   Patient is single and lives by his self  Single story just steps to get on front  porch dose not use front steps.   Patient has a high school education.   Patient is right-handed.   Patient does not have any children.   Patient is on disability.   Patient drinks three sodas daily.   Social Determinants of Health   Financial Resource Strain: Not on file  Food Insecurity: Not on file  Transportation Needs: Not on file  Physical Activity: Not on file  Stress: Not on file  Social Connections: Not on file  Intimate Partner Violence: Not on file    Review of Systems  Constitutional:  Positive for fatigue.  Respiratory:  Positive for apnea.   Psychiatric/Behavioral:  Positive for sleep disturbance.    Vitals:   09/06/21 1525  BP: 118/70  Pulse: 93  Temp: 98.1 F (36.7 C)  SpO2: 97%     Physical Exam Constitutional:      Appearance: He is obese.  HENT:     Head: Normocephalic.     Mouth/Throat:     Mouth: Mucous  membranes are moist.  Cardiovascular:     Rate and Rhythm: Normal rate and regular rhythm.     Heart sounds:    No friction rub.  Pulmonary:     Effort: No respiratory distress.     Breath sounds: No stridor. No wheezing or rhonchi.  Musculoskeletal:     Cervical back: No rigidity or tenderness.  Neurological:     Mental Status: He is alert.  Psychiatric:        Mood and Affect: Mood normal.       09/06/2021    3:00 PM 05/08/2017    2:00 PM  Results of the Epworth flowsheet  Sitting and reading 3 1  Watching TV 3 2  Sitting, inactive in a public place (e.g. a theatre or a meeting) 3 1  As a passenger in a car for an hour without a break 3 2  Lying down to rest in the afternoon when circumstances permit 3 1  Sitting and talking to someone 2 1  Sitting quietly after a lunch without alcohol 3 1  In a car, while stopped for a few minutes in traffic 2 1  Total score 22 10    Data Reviewed: Previous sleep study read by Dr. Maple Hudson -Severe obstructive sleep apnea, complex sleep apnea  Assessment:  Complex sleep apnea  Focal epilepsy  Complex partial epilepsy  Poor sleep hygiene  Daytime fatigue  Excessive daytime sleepiness  Plan/Recommendations: Obtain a thyroid function test  Increase activity as tolerated  Weight loss efforts as tolerated  Continue antiseizure medications  Tentative follow-up in 3 months  Call as needed   Virl Diamond MD [facility] 09/06/2021, 4:03 PM  CC: Shelva Majestic, MD

## 2021-09-06 NOTE — Patient Instructions (Signed)
Thyroid function test  Increase activity as tolerated  Sleep hygiene should be optimized -Set a set sleep time no more than 8 hours of sleep  Increase activity may include a bike, walking  Avoid too much risk -Understandable that you have a significant risk of falls and self injury  Weight loss will help greatly to  More information about the inspire device can be found [email or web address]

## 2021-09-07 LAB — TSH: TSH: 2.46 u[IU]/mL (ref 0.35–5.50)

## 2021-09-08 ENCOUNTER — Other Ambulatory Visit: Payer: PPO

## 2021-09-14 ENCOUNTER — Encounter: Payer: Self-pay | Admitting: Neurology

## 2021-09-16 ENCOUNTER — Ambulatory Visit
Admission: RE | Admit: 2021-09-16 | Discharge: 2021-09-16 | Disposition: A | Discharge status: Home / Self Care | Payer: PPO | Source: Ambulatory Visit | Attending: Neurology | Admitting: Neurology

## 2021-09-16 DIAGNOSIS — G40211 Localization-related (focal) (partial) symptomatic epilepsy and epileptic syndromes with complex partial seizures, intractable, with status epilepticus: Secondary | ICD-10-CM

## 2021-09-16 DIAGNOSIS — S0990XA Unspecified injury of head, initial encounter: Secondary | ICD-10-CM | POA: Diagnosis not present

## 2021-09-16 DIAGNOSIS — R569 Unspecified convulsions: Secondary | ICD-10-CM | POA: Diagnosis not present

## 2021-09-16 DIAGNOSIS — R296 Repeated falls: Secondary | ICD-10-CM

## 2021-09-19 ENCOUNTER — Encounter: Payer: Self-pay | Admitting: Neurology

## 2021-09-21 ENCOUNTER — Telehealth (INDEPENDENT_AMBULATORY_CARE_PROVIDER_SITE_OTHER): Payer: PPO | Admitting: Neurology

## 2021-09-21 ENCOUNTER — Encounter: Payer: Self-pay | Admitting: Neurology

## 2021-09-21 DIAGNOSIS — H02401 Unspecified ptosis of right eyelid: Secondary | ICD-10-CM

## 2021-09-21 DIAGNOSIS — G40211 Localization-related (focal) (partial) symptomatic epilepsy and epileptic syndromes with complex partial seizures, intractable, with status epilepticus: Secondary | ICD-10-CM

## 2021-09-21 DIAGNOSIS — R531 Weakness: Secondary | ICD-10-CM

## 2021-09-21 NOTE — Progress Notes (Signed)
Virtual Visit via Video Note The purpose of this virtual visit is to provide medical care while limiting exposure to the novel coronavirus.    Consent was obtained for video visit:  Yes.   Answered questions that patient had about telehealth interaction:  Yes.   I discussed the limitations, risks, security and privacy concerns of performing an evaluation and management service by telemedicine. I also discussed with the patient that there may be a patient responsible charge related to this service. The patient expressed understanding and agreed to proceed.  Pt location: Home Physician Location: office Name of referring provider:  Marin Olp, MD I connected with Gregg Young at patients initiation/request on 09/21/2021 at 10:00 AM EDT by video enabled telemedicine application and verified that I am speaking with the correct person using two identifiers. Pt MRN:  [id number] Pt DOB:  [birth year] Video Participants:  Gregg Young; Enid Derry (mother)   History of Present Illness:  The patient had a virtual video visit on 09/21/2021. After several attempts, patient could be seen on video but audio was not working and switched to a telephone visit. On his last visit, his mother was reporting more seizures with falls. She also continued to report that he is sleeping all the time, she had great difficulty getting him awake enough to eat 1 meal a day. Onfi dose has been weaned down more from 20mg  to 10mg  qhs. His Epidiolex dose was also reduced back to 66mL BID 3 weeks ago. Last week, his mother reported he is still sleeping 14 hours a day and one time was disoriented with slurred speech and dizziness. He had a head CT without contrast done 09/16/21 which did not show any acute changes, bilateral frontal approach DBS electrodes were seen with tip of left-sided electrode not very well visualized due to artifact but appeared to be either within the ventricle or the very medial aspect of the left  thalamus. His mother also reported that he does not qualify for the [facility] device for OSA due to his weight. She also reported that he is having episodes when his leg will move, foot will kick up, and his hands go numb at times. He has noticed that his hand would shake for 5 minutes when holding the remote control or a drink. She expressed concern about his ptosis and muscle weakness, asking about myasthenia gravis. Discussed that myasthenia panel in 2021 was negative. He reports his last seizure was yesterday. He has not had any falls from seizures since his last visit 6 weeks ago. He states he feels drowsy, but denies any dizziness, diplopia, difficulty swallowing. We again discussed uncontrolled OSA and he continues to refuse CPAP. He is taking clobazam 10mg  qhs, Epidiolex 67mL BID, Vimpat 150mg  BID, Levetiracetam 2000mg  BID, Lamotrigine 200mg  TID, and Pregabalin 225mg  TID.    Seizure History: This is a 46 yo RH man with a history of seizures since age 85. He has no recollection of events, no prior warning, witnessed by his mother to have a generalized convulsion lasting 90 to 120 seconds. He was brought to [facility] then had another convulsion 2 days later. They recall trying different medications, Depakote caused liver dysfunction, he has failed Dilantin and Zonegran. He has been on Keppra, Lamictal, Lyrica, and most recently Vimpat.  He had an EMU admission at [facility], records unavailable for review, per notes he stayed for 36 hours and had generalized seizures, multiple foci.  They report two admissions for status epilepticus, one  in September 2002 in the setting of weaning off Depakote, and another in January 2003.  Records unavailable for review.  His last GTC was around 5 years ago. He continued to have "petit mals" several times a day where his eyes would roll back, hands would shake for 30-45 seconds, if standing he would fall and had required sutures and staples in the past. He had been having the  "petit mals" several times daily until 3 weeks ago when he started having a different type of episode and the petit mals "completely stopped."  He was brought to the ER on 12/07/13 when his parents awoke to him dry having and speaking gibberish. He would be unable to speak, control his limbs, and cannot stand up without assistance. He can hear people around him but his jaw feels tight. The episodes can last for several hours. He has had 4 episodes in the past 3 weeks.  The patient reports that they have been occurring only during the weekends, however his parents remind him he had one on Wednesday. He went to his neurologist's office on 09/10 where he had an episode while walking to the exam room where he became dizzy and fell.  His father was able to catch him, and they reported his speech became affected. He was noted to be speaking in a whisper throughout the visit.  He does note that he becomes more dizzy after taking his medications.  Lamictal level was 17.8, Vimpat level 7.3.   The patient lives with his parents and brother. He is on disability and mostly stays at home playing video games. He graduated high school, no special ed classes. He endorses olfactory hallucinations but cannot describe it except saying they are the same all the time.   Epilepsy Risk Factors:  He had a skull fracture on the right side after a fall at 47 months of age. Otherwise he had a normal birth and early development.  There is no history of febrile convulsions, CNS infections such as meningitis/encephalitis, neurosurgical procedures, or family history of seizures.  Prior AEDs: Depakote, Dilantin, Zonegran  EEGs: 48-hour EEG (03/17/14 to 03/19/14) abnormal with multifocal discharges and right temporal slowing. Prior records note "generalized seizures, multiple foci" He had a 72-hour EEG (09/26/16 to 09/29/16) which was abnormal with bursts and runs of focal slowing over the right temporal region, bursts and runs of diffuse  rhythmic slowing with intermixed generalized spikes lasting up to 120 seconds without clinical correlate, multifocal epileptiform discharges seen over the right temporal, left frontal regions, as well as generalized spikes and polyspikes. During sleep, there were bursts of generalized fast activity. There were 2 clinicoelectrographic seizures captured with right head turn and some hypermotor activity that were non-lateralizing on EEG with diffuse background suppression at seizure onset.  MRI: I personally reviewed MRI brain with and without contrast done 12/26/2013 which did not show any acute intracranial abnormality, hippocampi symmetric without abnormal signal or enhancement.  He has had an extensive evaluation at [facility] in 2019 with repeat MRI brain 07/2017 showing no clear abnormality, PET scan with questionable decreased uptake on the left, Neuropsych testing suggesting more right-sided dysfunction. He underwent sEEG which was non-lateralizing, broad activity at seizure onset with low voltage fast activity from left and right, insular origin also suspicious. He had DBS placed last 10/2017 for non-resective stimulation treatment.     Current Outpatient Medications on File Prior to Visit  Medication Sig Dispense Refill   cannabidiol (EPIDIOLEX) 100 MG/ML solution Take 57mL  three times a day (Patient taking differently: Take 66mL twice times a day) 720 mL 5   cloBAZam (ONFI) 10 MG tablet Take 1 tablet every night 180 tablet 1   escitalopram (LEXAPRO) 10 MG tablet Take 1 tablet (10 mg total) by mouth daily. 30 tablet 6   Lacosamide (VIMPAT) 100 MG TABS Take one and 1/2 tablets twice a day 90 tablet 5   lamoTRIgine (LAMICTAL) 100 MG tablet TAKE 2 TABLETS BY MOUTH 3 TIMES DAILY 180 tablet 11   levETIRAcetam (KEPPRA) 1000 MG tablet TAKE 2 TABLETS BY MOUTH 2 TIMES DAILY 120 tablet 11   pregabalin (LYRICA) 225 MG capsule TAKE 1 CAPSULE BY MOUTH 3 TIMES DAILY 270 capsule 3   No current facility-administered  medications on file prior to visit.     Observations/Objective:   GEN:  The patient appears stated age and is in NAD. Neurological examination: Patient is awake, alert, able to answer questions without dysarthria. No facial asymmetry. Motor: moves all extremities symmetrically, at least anti-gravity x 4.    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 Family reports seizures started at age 50. He is on 6 ASMs in addition to VNS and DBS with continued seizures. His mother continues to report significant drowsiness despite reduction in clobazam and Epidiolex doses. I suspect this may be due to his uncontrolled OSA, however he continues to refuse CPAP. We discussed checking CMP, ammonia levels. His mother continues to report ptosis and generalized weakness, we will repeat myasthenia gravis bloodwork and order EMG/NCV of right UE and LE. His mother would like to try weaning off the Epidiolex. If bloodwork is unremarkable, we can certainly start weaning this off and consider a different ASM, Cenobamate. Discussed that being on the lower dose of clobazam and potentially reducing Vimpat dose may also be needed to tolerate Cenobamate. Continue current ASMs for now. Follow-up as scheduled in October 2023, call for any changes.    Follow Up Instructions:   -I discussed the assessment and treatment plan with the patient. The patient was provided an opportunity to ask questions and all were answered. The patient agreed with the plan and demonstrated an understanding of the instructions.   The patient was advised to call back or seek an in-person evaluation if the symptoms worsen or if the condition fails to improve as anticipated.    Total time spent on today's visit was 15 minutes, including both face-to-face time and nonface-to-face time.  Time included that spent on review of records (prior notes available to me/labs/imaging if pertinent), discussing treatment and  goals, answering patient's questions and coordinating care.   Cameron Sprang, MD

## 2021-09-23 ENCOUNTER — Other Ambulatory Visit: Payer: Self-pay

## 2021-09-23 ENCOUNTER — Other Ambulatory Visit: Payer: PPO

## 2021-09-23 DIAGNOSIS — G40211 Localization-related (focal) (partial) symptomatic epilepsy and epileptic syndromes with complex partial seizures, intractable, with status epilepticus: Secondary | ICD-10-CM

## 2021-09-23 LAB — COMPREHENSIVE METABOLIC PANEL
ALT: 28 U/L (ref 0–53)
AST: 18 U/L (ref 0–37)
Albumin: 4.5 g/dL (ref 3.5–5.2)
Alkaline Phosphatase: 96 U/L (ref 39–117)
BUN: 8 mg/dL (ref 6–23)
CO2: 31 mEq/L (ref 19–32)
Calcium: 9.3 mg/dL (ref 8.4–10.5)
Chloride: 103 mEq/L (ref 96–112)
Creatinine, Ser: 0.92 mg/dL (ref 0.40–1.50)
GFR: 99.24 mL/min (ref >60.00)
Glucose, Bld: 118 mg/dL — ABNORMAL HIGH (ref 70–99)
Potassium: 3.6 mEq/L (ref 3.5–5.1)
Sodium: 140 mEq/L (ref 135–145)
Total Bilirubin: 0.5 mg/dL (ref 0.2–1.2)
Total Protein: 7.2 g/dL (ref 6.0–8.3)

## 2021-09-23 LAB — AMMONIA: Ammonia: 61 umol/L — ABNORMAL HIGH (ref 11–35)

## 2021-09-23 NOTE — Addendum Note (Signed)
Addended by: Leida Lauth on: 09/23/2021 04:21 PM   Modules accepted: Orders

## 2021-09-23 NOTE — Progress Notes (Signed)
Call from lab please reorder Ammonia and CMP.  Labs reordered.

## 2021-09-25 ENCOUNTER — Encounter: Payer: Self-pay | Admitting: Neurology

## 2021-09-28 ENCOUNTER — Other Ambulatory Visit: Payer: Self-pay

## 2021-09-28 DIAGNOSIS — G40219 Localization-related (focal) (partial) symptomatic epilepsy and epileptic syndromes with complex partial seizures, intractable, without status epilepticus: Secondary | ICD-10-CM

## 2021-09-28 DIAGNOSIS — G4719 Other hypersomnia: Secondary | ICD-10-CM

## 2021-09-28 DIAGNOSIS — R42 Dizziness and giddiness: Secondary | ICD-10-CM

## 2021-09-30 ENCOUNTER — Other Ambulatory Visit (INDEPENDENT_AMBULATORY_CARE_PROVIDER_SITE_OTHER): Payer: PPO

## 2021-09-30 DIAGNOSIS — R42 Dizziness and giddiness: Secondary | ICD-10-CM

## 2021-09-30 DIAGNOSIS — G4719 Other hypersomnia: Secondary | ICD-10-CM | POA: Diagnosis not present

## 2021-09-30 DIAGNOSIS — G40219 Localization-related (focal) (partial) symptomatic epilepsy and epileptic syndromes with complex partial seizures, intractable, without status epilepticus: Secondary | ICD-10-CM

## 2021-09-30 LAB — AMMONIA: Ammonia: 68 umol/L — ABNORMAL HIGH (ref 11–35)

## 2021-10-04 ENCOUNTER — Other Ambulatory Visit: Payer: Self-pay | Admitting: Physician Assistant

## 2021-10-04 DIAGNOSIS — G40211 Localization-related (focal) (partial) symptomatic epilepsy and epileptic syndromes with complex partial seizures, intractable, with status epilepticus: Secondary | ICD-10-CM

## 2021-10-04 LAB — MYASTHENIA GRAVIS PANEL 1
A CHR BINDING ABS: <0.3 nmol/L
STRIATED MUSCLE AB SCREEN: NEGATIVE

## 2021-10-06 ENCOUNTER — Encounter: Payer: Self-pay | Admitting: Neurology

## 2021-10-06 ENCOUNTER — Telehealth (INDEPENDENT_AMBULATORY_CARE_PROVIDER_SITE_OTHER): Payer: PPO | Admitting: Neurology

## 2021-10-06 VITALS — Ht 68.0 in | Wt 256.0 lb

## 2021-10-06 DIAGNOSIS — G4719 Other hypersomnia: Secondary | ICD-10-CM

## 2021-10-06 DIAGNOSIS — E7220 Disorder of urea cycle metabolism, unspecified: Secondary | ICD-10-CM | POA: Diagnosis not present

## 2021-10-06 DIAGNOSIS — G40211 Localization-related (focal) (partial) symptomatic epilepsy and epileptic syndromes with complex partial seizures, intractable, with status epilepticus: Secondary | ICD-10-CM

## 2021-10-06 MED ORDER — LEVOCARNITINE 330 MG PO TABS: 330.0000 mg | ORAL_TABLET | Freq: Two times a day (BID) | ORAL | 6 refills | Status: DC

## 2021-10-06 NOTE — Progress Notes (Signed)
Virtual Visit via Video Note The purpose of this virtual visit is to provide medical care while limiting exposure to the novel coronavirus.    Consent was obtained for video visit:  Yes.   Answered questions that patient had about telehealth interaction:  Yes.   I discussed the limitations, risks, security and privacy concerns of performing an evaluation and management service by telemedicine. I also discussed with the patient that there may be a patient responsible charge related to this service. The patient expressed understanding and agreed to proceed.  Pt location: Home Physician Location: office Name of referring provider:  Shelva Majestic, MD I connected with Gregg Young at patients initiation/request on 10/06/2021 at  1:30 PM EDT by video enabled telemedicine application and verified that I am speaking with the correct person using two identifiers. Pt MRN:  [id number] Pt DOB:  [birth year] Video Participants:  Gregg Young;  Josefa Half (mother)   History of Present Illness:  The patient had a virtual video visit on 10/06/2021. His mother is present during the visit. He was last evaluated in the Neurology clinic 2 weeks ago for increased lethargy and sedation. Clobazam dose had been reduced from 30mg  to 10mg  qhs. Epidiolex dose was reduced to his prior dose of 9mg  BID at the beginning of June. No improvement in drowsiness. Head CT done in June 2023 no acute changes seen. He is not a candidate for the Inspire device for OSA due to his weight, and he absolutely refuses to use a CPAP machine. Bloodwork done showed normal CBC, CMP (including LFTs). Ammonia level on 09/23/21 was 61. Repeat level on 6/29 was 68. I have not seen much data/information about the Epidiolex causing hyperammonemia, I have contacted their medical science liaison.   He is very sleepy during today's visit. His mother reports she has to wake him up daily at 1pm to give him his mid-day dose of medications. His last  seizure was yesterday, last fall from a seizure was a few weeks ago. It is hard to track down his sleep habits at night, however his mother reports he sleeps on his stomach. He denies any headaches, dizziness, focal numbness/tingling/weakness. He is taking clobazam 10mg  qhs, Epidiolex 61mL BID, Vimpat 150mg  BID, Levetiracetam 2000mg  BID, Lamotrigine 200mg  TID, and Pregabalin 225mg  TID.   His mother was concerned about continued ptosis and weakness, repeat myasthenia panel was again negative. He is scheduled for an EMG/NCV in September.   Seizure History: This is a 46 yo RH man with a history of seizures since age 66. He has no recollection of events, no prior warning, witnessed by his mother to have a generalized convulsion lasting 90 to 120 seconds. He was brought to [facility] then had another convulsion 2 days later. They recall trying different medications, Depakote caused liver dysfunction, he has failed Dilantin and Zonegran. He has been on Keppra, Lamictal, Lyrica, and most recently Vimpat.  He had an EMU admission at [facility], records unavailable for review, per notes he stayed for 36 hours and had generalized seizures, multiple foci.  They report two admissions for status epilepticus, one in September 2002 in the setting of weaning off Depakote, and another in January 2003.  Records unavailable for review.  His last GTC was around 5 years ago. He continued to have "petit mals" several times a day where his eyes would roll back, hands would shake for 30-45 seconds, if standing he would fall and had required sutures and staples in the  past. He had been having the "petit mals" several times daily until 3 weeks ago when he started having a different type of episode and the petit mals "completely stopped."  He was brought to the ER on 12/07/13 when his parents awoke to him dry having and speaking gibberish. He would be unable to speak, control his limbs, and cannot stand up without assistance. He can hear people  around him but his jaw feels tight. The episodes can last for several hours. He has had 4 episodes in the past 3 weeks.  The patient reports that they have been occurring only during the weekends, however his parents remind him he had one on Wednesday. He went to his neurologist's office on 09/10 where he had an episode while walking to the exam room where he became dizzy and fell.  His father was able to catch him, and they reported his speech became affected. He was noted to be speaking in a whisper throughout the visit.  He does note that he becomes more dizzy after taking his medications.  Lamictal level was 17.8, Vimpat level 7.3.   The patient lives with his parents and brother. He is on disability and mostly stays at home playing video games. He graduated high school, no special ed classes. He endorses olfactory hallucinations but cannot describe it except saying they are the same all the time.   Epilepsy Risk Factors:  He had a skull fracture on the right side after a fall at 47 months of age. Otherwise he had a normal birth and early development.  There is no history of febrile convulsions, CNS infections such as meningitis/encephalitis, neurosurgical procedures, or family history of seizures.  Prior AEDs: Depakote, Dilantin, Zonegran  EEGs: 48-hour EEG (03/17/14 to 03/19/14) abnormal with multifocal discharges and right temporal slowing. Prior records note "generalized seizures, multiple foci" He had a 72-hour EEG (09/26/16 to 09/29/16) which was abnormal with bursts and runs of focal slowing over the right temporal region, bursts and runs of diffuse rhythmic slowing with intermixed generalized spikes lasting up to 120 seconds without clinical correlate, multifocal epileptiform discharges seen over the right temporal, left frontal regions, as well as generalized spikes and polyspikes. During sleep, there were bursts of generalized fast activity. There were 2 clinicoelectrographic seizures captured  with right head turn and some hypermotor activity that were non-lateralizing on EEG with diffuse background suppression at seizure onset.  MRI: I personally reviewed MRI brain with and without contrast done 12/26/2013 which did not show any acute intracranial abnormality, hippocampi symmetric without abnormal signal or enhancement.  He has had an extensive evaluation at [facility] in 2019 with repeat MRI brain 07/2017 showing no clear abnormality, PET scan with questionable decreased uptake on the left, Neuropsych testing suggesting more right-sided dysfunction. He underwent sEEG which was non-lateralizing, broad activity at seizure onset with low voltage fast activity from left and right, insular origin also suspicious. He had DBS placed last 10/2017 for non-resective stimulation treatment.     Current Outpatient Medications on File Prior to Visit  Medication Sig Dispense Refill   cannabidiol (EPIDIOLEX) 100 MG/ML solution Take 76mL three times a day (Patient taking differently: Take 39mL twice times a day) 720 mL 5   escitalopram (LEXAPRO) 10 MG tablet Take 1 tablet (10 mg total) by mouth daily. 30 tablet 6   Lacosamide (VIMPAT) 100 MG TABS Take one and 1/2 tablets twice a day 90 tablet 5   lamoTRIgine (LAMICTAL) 100 MG tablet TAKE 2 TABLETS BY  MOUTH 3 TIMES DAILY 180 tablet 11   levETIRAcetam (KEPPRA) 1000 MG tablet TAKE 2 TABLETS BY MOUTH 2 TIMES DAILY 120 tablet 11   pregabalin (LYRICA) 225 MG capsule TAKE 1 CAPSULE BY MOUTH 3 TIMES DAILY 270 capsule 3   cloBAZam (ONFI) 10 MG tablet Take 1 tablet at bedtime 90 tablet 3   No current facility-administered medications on file prior to visit.     Observations/Objective:   Vitals:   10/06/21 1237  Weight: 256 lb (116.1 kg)  Height: 5\' 8"  (1.727 m)   GEN:  The patient appears stated age and is in NAD.  Neurological examination: Patient is drowsy, easily arousable but would close eyes when not stimulated. No aphasia or dysarthria. Intact fluency and  comprehension. Cranial nerves: Extraocular movements intact with no nystagmus. No ptosis today. No facial asymmetry. Motor: moves all extremities symmetrically, at least anti-gravity x 4.    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 Family reports seizures started at age 8. He is on 6 ASMs in addition to VNS and DBS with continued seizures. He has been having significant daytime drowsiness despite reduction in clobazam and Epidiolex doses. His ammonia level is two times upper limit of normal, unclear if related to his drowsiness, I still suspect uncontrolled OSA is the main driver, however he is unwilling to treat this. Hyperammonemia can occur with Depakote and is treated with levocarnitine, however I have not found much information about Epidiolex causing this and further investigation is underway. In the meantime, we discussed further reducing Epidiolex to 38mL BID for 1 week, then 36mL BID. He will start levocarnitine 330mg  BID and we will re-check ammonia level in 3 weeks. Continue all other ASMs for now, we may add on Cenobamate moving forward if seizures worsen, but will need to reduce Vimpat dose to help with side effects. Follow-up as scheduled in October, call for any changes.    Follow Up Instructions:   -I discussed the assessment and treatment plan with the patient. The patient was provided an opportunity to ask questions and all were answered. The patient agreed with the plan and demonstrated an understanding of the instructions.   The patient was advised to call back or seek an in-person evaluation if the symptoms worsen or if the condition fails to improve as anticipated.    , MD   CC: Dr. November

## 2021-10-06 NOTE — Patient Instructions (Signed)
Hopefully we can get you feeling better soon.  Reduce Epidiolex to 85mL twice a day for 1 week, then reduce to 64mL twice a day.   2. Start levocarnitine (Carnitor) 330mg : take 1 capsule twice a day  3. Re-check ammonia level in 3 weeks  4. Continue all other medications. If seizures increase, we will plan to start Xcopri (Cenobamate)  5. Follow-up as scheduled in October, call for any changes

## 2021-10-07 ENCOUNTER — Telehealth (HOSPITAL_COMMUNITY): Payer: Self-pay | Admitting: Pharmacy Technician

## 2021-10-07 NOTE — Telephone Encounter (Signed)
Patient Advocate Encounter   Received notification that prior authorization for levOCARNitine 330MG  tablets is required.   PA submitted on 10/07/2021 Key [id number] Status is pending       12/08/2021, CPhT Pharmacy Patient Advocate Specialist [facility] Patient Advocate Team Direct Number: [phone]  Fax: [phone]

## 2021-10-08 ENCOUNTER — Encounter: Payer: Self-pay | Admitting: Neurology

## 2021-10-11 ENCOUNTER — Telehealth: Payer: Self-pay | Admitting: Neurology

## 2021-10-11 NOTE — Telephone Encounter (Signed)
Called pt mother they have picked up the medication

## 2021-10-11 NOTE — Telephone Encounter (Signed)
Pt's mother called in on 10/09/21 at 6:33 PM and left a message with the access nurse. The pt is out of Keppra and needs 6 tablets called into the pharmacy to get through until Monday. Access nurse contacted doctor on call. Full report in Dr. Rosalyn Gess box.

## 2021-10-18 NOTE — Telephone Encounter (Signed)
Received a fax regarding Prior Authorization for LEVOCARNITINE 330MG . Authorization has been DENIED because the insurance does not cover this medication for off-label diagnosis codes.  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  , CPhT Pharmacy Patient Advocate Specialist [facility] Patient Advocate Team Phone: [phone]   Fax: [phone]

## 2021-10-19 ENCOUNTER — Telehealth: Payer: Self-pay

## 2021-10-19 MED ORDER — LEVOCARNITINE 1 GM/10ML PO SOLN: ORAL | 12 refills | Status: DC

## 2021-10-19 NOTE — Addendum Note (Signed)
Addended by: Van Clines on: 10/19/2021 01:46 PM   Modules accepted: Orders

## 2021-10-19 NOTE — Telephone Encounter (Signed)
Pt mother called in an was informed that Dr Karel Jarvis got a message they tried to fill the Levocarnitine yesterday but insurance denied the PA. I let her know that  they can get it through [facility] and looks like at a much lower price. Pt mother is ok with [place] sending it to [place]

## 2021-10-19 NOTE — Telephone Encounter (Signed)
Pt mother called no answer left a voice mail to call the office back when she calls back I will inform her we got a message they tried to fill the Levocarnitine yesterday but insurance denied the PA. I will let her know they can get it through [facility] and looks like at a much lower price

## 2021-11-01 ENCOUNTER — Other Ambulatory Visit: Payer: Self-pay | Admitting: Neurology

## 2021-11-01 ENCOUNTER — Other Ambulatory Visit: Payer: Self-pay

## 2021-11-01 MED ORDER — LACOSAMIDE 100 MG PO TABS
ORAL_TABLET | ORAL | 5 refills | Status: DC
Start: 2021-11-01 — End: 2022-03-29

## 2021-11-03 ENCOUNTER — Telehealth: Payer: Self-pay | Admitting: Neurology

## 2021-11-03 NOTE — Telephone Encounter (Signed)
Pharmacy called and stated they received the RX on the 18th.  She said the email is [email or web address] and it is under Gregg Young not [facility].

## 2021-11-03 NOTE — Telephone Encounter (Signed)
Pt's mother called in stating she has never heard from the [facility] pharmacy and wants to double check we sent in the new prescription to them?

## 2021-11-03 NOTE — Telephone Encounter (Addendum)
Pt mother called [facility] have pt prescription they need to go to [email or web address] to sign up it will be $5 for normal shipment and $15 to expedite it.

## 2021-11-03 NOTE — Telephone Encounter (Signed)
Pls check, it is the Levocarnitine

## 2021-11-05 ENCOUNTER — Ambulatory Visit: Payer: PPO | Admitting: Neurology

## 2021-11-10 ENCOUNTER — Encounter: Payer: Self-pay | Admitting: Neurology

## 2021-11-10 ENCOUNTER — Telehealth: Payer: Self-pay

## 2021-11-10 NOTE — Telephone Encounter (Signed)
Called patients mom back and gave here resources for mobile unit [phone] and the ED or [facility] [phone] patients mom said he wont go anywhere so she is going to try the mobile unit

## 2021-12-01 ENCOUNTER — Encounter: Payer: Self-pay | Admitting: Neurology

## 2021-12-01 DIAGNOSIS — G40211 Localization-related (focal) (partial) symptomatic epilepsy and epileptic syndromes with complex partial seizures, intractable, with status epilepticus: Secondary | ICD-10-CM

## 2021-12-01 MED ORDER — EPIDIOLEX 100 MG/ML PO SOLN: ORAL | 5 refills | Status: DC

## 2021-12-08 ENCOUNTER — Ambulatory Visit: Payer: PPO | Admitting: Pulmonary Disease

## 2021-12-14 ENCOUNTER — Telehealth: Payer: Self-pay | Admitting: *Deleted

## 2021-12-14 NOTE — Patient Outreach (Signed)
  Care Coordination   12/14/2021 Name: Gregg Young MRN: [id number] DOB: Dec 07, 1975   Care Coordination Outreach Attempts:  An unsuccessful telephone outreach was attempted today to offer the patient information about available care coordination services as a benefit of their health plan.   Follow Up Plan:  Additional outreach attempts will be made to offer the patient care coordination information and services.   Encounter Outcome:  No Answer  Care Coordination Interventions Activated:  No   Care Coordination Interventions:  No, not indicated    Elliot Cousin, RN Care Management Coordinator [facility] [employer] Main Office [phone]

## 2021-12-20 ENCOUNTER — Other Ambulatory Visit: Payer: Self-pay

## 2021-12-20 DIAGNOSIS — G40211 Localization-related (focal) (partial) symptomatic epilepsy and epileptic syndromes with complex partial seizures, intractable, with status epilepticus: Secondary | ICD-10-CM

## 2021-12-20 DIAGNOSIS — G40219 Localization-related (focal) (partial) symptomatic epilepsy and epileptic syndromes with complex partial seizures, intractable, without status epilepticus: Secondary | ICD-10-CM

## 2021-12-21 ENCOUNTER — Other Ambulatory Visit: Payer: PPO

## 2021-12-21 ENCOUNTER — Ambulatory Visit (INDEPENDENT_AMBULATORY_CARE_PROVIDER_SITE_OTHER): Payer: PPO | Admitting: Neurology

## 2021-12-21 DIAGNOSIS — G40211 Localization-related (focal) (partial) symptomatic epilepsy and epileptic syndromes with complex partial seizures, intractable, with status epilepticus: Secondary | ICD-10-CM

## 2021-12-21 DIAGNOSIS — G40219 Localization-related (focal) (partial) symptomatic epilepsy and epileptic syndromes with complex partial seizures, intractable, without status epilepticus: Secondary | ICD-10-CM

## 2021-12-21 DIAGNOSIS — H02401 Unspecified ptosis of right eyelid: Secondary | ICD-10-CM

## 2021-12-21 DIAGNOSIS — R531 Weakness: Secondary | ICD-10-CM

## 2021-12-21 NOTE — Procedures (Signed)
[facility] Neurology  [address], [address]  [place], [place] [address] Tel: [phone] Fax:  [phone] Test Date:  12/21/2021  Patient: Gregg Young DOB: 12/18/1975 Physician: Narda Amber, DO  Sex: Male Height: 5\' 8"  Ref Phys: Ellouise Newer, M.D.  ID#: [id number]   Technician:    Patient Complaints: This is a 46 year old man referred for evaluation of generalized weakness and ptosis.  NCV & EMG Findings: Extensive electrodiagnostic testing of the right upper and lower extremities shows:  Right median and sural sensory responses are within normal limits Right median and peroneal motor responses are within normal limits. Repetitive nerve stimulation of the accessory, median, and peroneal nerve recording at the trapezius, abductor pollicis brevis, and anterior tibialis, respectively, is within normal limits.  Specifically, there is no evidence of abnormal decrement. There is no evidence of active or chronic motor axonal loss changes affecting any of the tested muscles.  Motor unit configuration and recruitment pattern is within normal limits.  Impression: This is a normal study of the right upper and lower extremities.  In particular, there is no evidence of neuromuscular junction disorder (i.e myasthenia gravis) or diffuse myopathy.   ___________________________ Narda Amber, DO    Nerve Conduction Studies Anti Sensory Summary Table   Stim Site NR Peak (ms) Norm Peak (ms) O-P Amp (V) Norm O-P Amp  Right Median Anti Sensory (2nd Digit)  35C  Wrist    2.8 <3.4 40.6 >20  Right Sural Anti Sensory (Lat Mall)  35C  Calf    2.4 <4.5 33.0 >5   Motor Summary Table   Stim Site NR Onset (ms) Norm Onset (ms) O-P Amp (mV) Norm O-P Amp Site1 Site2 Delta-0 (ms) Dist (cm) Vel (m/s) Norm Vel (m/s)  Right Median Motor (Abd Poll Brev)  35C  Wrist    3.2 <3.9 6.6 >6 Elbow Wrist 4.5 29.0 64 >50  Elbow    7.7  6.0         Right Peroneal Motor (Ext Dig Brev)  35C  Ankle    2.9  <5.5 8.3 >3 B Fib Ankle 6.6 40.0 61 >40  B Fib    9.5  8.3  Poplt B Fib 1.4 0.0  >40  Poplt    10.9  8.3         Right Peroneal TA Motor (Tib Ant)  35C  Fib Head    2.5 <4.0 6.9 >4 Poplit Fib Head 1.3 8.0 62 >40  Poplit    3.8  6.8          EMG   Side Muscle Ins Act Fibs Fasc Recrt Dur. Amp. Poly. Activation Comment  Right AntTibialis Nml Nml Nml Nml Nml Nml Nml Nml N/A  Right Gastroc Nml Nml Nml Nml Nml Nml Nml Nml N/A  Right RectFemoris Nml Nml Nml Nml Nml Nml Nml Nml N/A  Right GluteusMed Nml Nml Nml Nml Nml Nml Nml Nml N/A  Right 1stDorInt Nml Nml Nml Nml Nml Nml Nml Nml N/A  Right BrachioRad Nml Nml Nml Nml Nml Nml Nml Nml N/A  Right PronatorTeres Nml Nml Nml Nml Nml Nml Nml Nml N/A  Right Biceps Nml Nml Nml Nml Nml Nml Nml Nml N/A  Right Triceps Nml Nml Nml Nml Nml Nml Nml Nml N/A  Right Deltoid Nml Nml Nml Nml Nml Nml Nml Nml N/A  Right Flex Dig Long Nml Nml Nml Nml Nml Nml Nml Nml N/A   RNS   Trial # Label Amp 1 (mV)  O-P Amp 5 (mV)  O-P Amp % Dif Area 1 (mVms) Area 5 (mVms) Area % Dif Rep Rate Train Length Pause Time (min:sec) Comments  Right Abd Poll Brev  Tr 1 Baseline 5.90 5.83 -1.1 17.28 15.86 -8.2 3.00 10 00:30   Tr 2 Post Exercise 6.00 5.85 -2.5 16.13 15.19 -5.8 3.00 10 01:00   Tr 3 1 Min Post 6.01 5.88 -2.1 16.07 15.01 -6.6 3.00 10 01:00   Tr 4 2 Min Post 6.02 5.90 -2.1 15.86 14.93 -5.9 3.00 10 01:00   Tr 5 3 Min Post 6.01 5.90 -1.9 15.72 14.75 -6.2 3.00 10 00:00   Right Trapezius  Tr 1 Baseline 3.00 2.91 -2.9 22.15 22.14 -0.0 3.00 10 00:30   Tr 2 Post Exercise 3.09 3.00 -2.8 23.49 21.73 -7.5 3.00 10 01:00   Tr 4 1 Min Post 3.06 2.74 -10.4 23.11 21.19 -8.3 3.00 10 01:00   Tr 5 2 Min Post 2.89 2.66 -8.1 21.65 21.01 -3.0 3.00 10 01:00   Tr 7 3 Min Post 2.84 2.71 -4.6 22.24 21.14 -4.9 3.00 10 00:00   Right AntTibialis  Tr 1 Baseline 5.18 5.21 0.7 31.11 31.17 0.2 3.00 10 00:30   Tr 2 Post Exercise 5.74 5.71 -0.5 36.12 34.23 -5.2 3.00 10 01:00   Tr 3 1 Min Post  6.28 6.04 -3.9 39.31 36.81 -6.4 3.00 10 01:00   Tr 4 2 Min Post 6.39 6.21 -2.7 39.81 38.29 -3.8 3.00 10 01:00   Tr 5 3 Min Post 6.62 6.45 -2.6 41.85 39.74 -5.0 3.00 10 00:00         Waveforms:

## 2021-12-22 ENCOUNTER — Telehealth: Payer: Self-pay | Admitting: Neurology

## 2021-12-22 ENCOUNTER — Telehealth: Payer: Self-pay

## 2021-12-22 LAB — AMMONIA: Ammonia: 54 umol/L — ABNORMAL HIGH (ref 11–35)

## 2021-12-22 LAB — HEPATIC FUNCTION PANEL
ALT: 23 U/L (ref 0–53)
AST: 19 U/L (ref 0–37)
Albumin: 4.3 g/dL (ref 3.5–5.2)
Alkaline Phosphatase: 87 U/L (ref 39–117)
Bilirubin, Direct: 0.2 mg/dL (ref 0.0–0.3)
Total Bilirubin: 0.5 mg/dL (ref 0.2–1.2)
Total Protein: 7.3 g/dL (ref 6.0–8.3)

## 2021-12-22 NOTE — Telephone Encounter (Signed)
Pt called an informed of results nerve and muscle testing was normal.  No evidence of myasthenia gravis or muscle disorder

## 2021-12-22 NOTE — Telephone Encounter (Signed)
-----   Message from Alda Berthold, DO sent at 12/21/2021  3:41 PM EDT ----- Please let pt know nerve and muscle testing was normal.  No evidence of myasthenia gravis or muscle disorder, thanks.

## 2021-12-22 NOTE — Telephone Encounter (Signed)
Spoke with pt mother Russells father is going to bring him to the appointment she said disregard the 1st call

## 2021-12-22 NOTE — Telephone Encounter (Signed)
Pts mother called, stated she has jury duty on 10/3 and cant make [employer] appt. She dont think aquino will want to wait till April 2024 to see Gregg Young. Asked me to see what aquino said

## 2021-12-26 NOTE — Progress Notes (Signed)
Office Visit Note   Patient: Gregg Young           Date of Birth: 02/16/1976           MRN: [id number] Visit Date: 07/23/2021              Requested by: Marin Olp, MD [place],  [place] [id number] PCP: Marin Olp, MD   Assessment & Plan: Visit Diagnoses:  1. Acute bilateral low back pain without sciatica     Plan: At this point recommend patient continue ibuprofen.  Follow-up in 3 weeks for recheck.  If he continues have ongoing pain may consider physical therapy versus getting lumbar MRI.  Follow-Up Instructions: Return in about 3 weeks (around 08/13/2021) for with [facility] recheck lumbar.   Orders:  Orders Placed This Encounter  Procedures   XR Lumbar Spine 2-3 Views   No orders of the defined types were placed in this encounter.     Procedures: No procedures performed   Clinical Data: No additional findings.   Subjective: Chief Complaint  Patient presents with   Lower Back - Pain    HPI 46 year old male who is new patient comes in with complaints of back pain after a fall.  States that last Saturday he fell backwards landing on his back.  States that it does not hurt to walk sit or stand.  Pain worse in the morning.  Denies lower extremity radicular symptoms.  No previous problems for onset.  He has been using ibuprofen that has been doing well.  Reports a history of epilepsy Review of Systems No current complaints of cardiopulmonary GI/GU issues  Objective: Vital Signs: BP 119/80   Pulse 76   Ht 5\' 6"  (1.676 m)   Wt 263 lb (119.3 kg)   BMI 42.45 kg/m   Physical Exam HENT:     Head: Normocephalic and atraumatic.     Nose: Nose normal.  Pulmonary:     Effort: No respiratory distress.  Musculoskeletal:     Comments: Gait is normal.  No lumbar paraspinal tenderness or spasm.  Bilateral static notch nontender.  Negative logroll bilateral hips.  Negative straight leg raise.  No focal motor deficits.  Neurological:      Mental Status: He is alert and oriented to person, place, and time.     Ortho Exam  Specialty Comments:  No specialty comments available.  Imaging: No results found.   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 ([place]) 09/14/2017   Obstructive sleep apnea 05/08/2017   Excessive daytime sleepiness 02/28/2017   Dizziness and giddiness 07/08/2014   Focal epilepsy with impairment of consciousness, intractable ([place]) 01/15/2014   Partial symptomatic epilepsy ([place]) 07/13/2012   Past Medical History:  Diagnosis Date   History of kidney stones    Seizures ([place])    daily    Family History  Problem Relation Age of Onset   Healthy Mother    Healthy Father     Past Surgical History:  Procedure Laterality Date   EYE SURGERY     HERNIA REPAIR     baby   stimulator battery replacement  01/2020   @[facility]    VAGUS NERVE STIMULATOR INSERTION Left 03/10/2015   Procedure: LEFT SIDED PLACEMENT VAGAL NERVE STIMULATOR IMPLANT;  Surgeon: Consuella Lose, MD;  Location: [facility];  Service: Neurosurgery;  Laterality: Left;   Social History  Occupational History   Not on file  Tobacco Use   Smoking status: Former    Packs/day: 0.50    Years: 4.00    Total pack years: 2.00    Types: Cigarettes    Quit date: 03/03/1995    Years since quitting: 26.8   Smokeless tobacco: Never  Vaping Use   Vaping Use: Never used  Substance and Sexual Activity   Alcohol use: No    Alcohol/week: 0.0 standard drinks of alcohol   Drug use: No   Sexual activity: Not on file

## 2021-12-27 ENCOUNTER — Encounter: Payer: Self-pay | Admitting: *Deleted

## 2021-12-27 ENCOUNTER — Encounter: Payer: Self-pay | Admitting: Neurology

## 2021-12-27 NOTE — Telephone Encounter (Signed)
Can they come 9/27 morning slots? Thanks

## 2022-01-03 ENCOUNTER — Encounter: Payer: Self-pay | Admitting: *Deleted

## 2022-01-03 ENCOUNTER — Telehealth: Payer: Self-pay | Admitting: *Deleted

## 2022-01-03 NOTE — Patient Instructions (Signed)
Visit Information  Thank you for taking time to visit with me today. Please don't hesitate to contact me if I can be of assistance to you.   Following are the goals we discussed today:   Goals Addressed             This Visit's Progress    COMPLETED: No needs       Care Coordination Interventions: Advised patient to contact his provider's office to scheduled his AWV for 2023. Reviewed medications with patient and discussed adherence with no needed refills. Reviewed scheduled/upcoming provider appointments including all pending appointments Assessed social determinant of health barriers          Please call the care guide team at [phone] if you need to cancel or reschedule your appointment.   If you are experiencing a Mental Health or [place] or need someone to talk to, please call the Suicide and Crisis Lifeline: 988  Patient verbalizes understanding of instructions and care plan provided today and agrees to view in [place]. Active MyChart status and patient understanding of how to access instructions and care plan via MyChart confirmed with patient.     No further follow up required: No needs  Raina Mina, RN Care Management Coordinator [place] Office [phone]

## 2022-01-03 NOTE — Patient Outreach (Signed)
  Care Coordination   Initial Visit Note   01/03/2022 Name: EADEN HETTINGER MRN: [id number] DOB: [birth year]  TRAVELLE MCCLIMANS is a 46 y.o. year old male who sees Yong Channel, Brayton Mars, MD for primary care. I  spoke with the DPR spouse Hassan Rowan.  What matters to the patients health and wellness today?  No needs    Goals Addressed             This Visit's Progress    COMPLETED: No needs       Care Coordination Interventions: Advised patient to contact his provider's office to scheduled his AWV for 2023. Reviewed medications with patient and discussed adherence with no needed refills. Reviewed scheduled/upcoming provider appointments including all pending appointments Assessed social determinant of health barriers         SDOH assessments and interventions completed:  Yes  SDOH Interventions Today    Flowsheet Row Most Recent Value  SDOH Interventions   Food Insecurity Interventions Intervention Not Indicated  Housing Interventions Intervention Not Indicated  Transportation Interventions Intervention Not Indicated        Care Coordination Interventions Activated:  Yes  Care Coordination Interventions:  Yes, provided   Follow up plan: No further intervention required.   Encounter Outcome:  Pt. Visit Completed   Raina Mina, RN Care Management Coordinator [place] Office [phone]

## 2022-01-04 ENCOUNTER — Encounter: Payer: Self-pay | Admitting: Neurology

## 2022-01-04 ENCOUNTER — Ambulatory Visit (INDEPENDENT_AMBULATORY_CARE_PROVIDER_SITE_OTHER): Payer: PPO | Admitting: Neurology

## 2022-01-04 VITALS — BP 122/76 | HR 69 | Ht 66.0 in | Wt 248.6 lb

## 2022-01-04 DIAGNOSIS — G40211 Localization-related (focal) (partial) symptomatic epilepsy and epileptic syndromes with complex partial seizures, intractable, with status epilepticus: Secondary | ICD-10-CM | POA: Diagnosis not present

## 2022-01-04 MED ORDER — EPIDIOLEX 100 MG/ML PO SOLN: ORAL | 5 refills | Status: DC

## 2022-01-04 NOTE — Patient Instructions (Signed)
Always good to see you.  Reduce Epidiolex to 109mL twice a day  2. Continue all your other medications  3. Swipe the magnet three times a day even if not having seizures  4. Follow-up at [facility] to check DBS battery  5. Follow-up in 3 months, call for any changes

## 2022-01-04 NOTE — Progress Notes (Signed)
NEUROLOGY FOLLOW UP OFFICE NOTE  BUSH MURDOCH [id number] 22-Sep-1975  HISTORY OF PRESENT ILLNESS: I had the pleasure of seeing Marcellus Pulliam in follow-up in the neurology clinic on 01/04/2022.  The patient was last seen 3 months ago for intractable epilepsy. He is again accompanied by his mother who helps supplement the history today.  Records and images were personally reviewed where available. He was having increased lethargy and sedation. He was started on levocarnitine for hypoammonemia. His Epidiolex was reduced from 9mg  BID to 7mg  BID. Repeat ammonia level in 12/2021 was 54 (previously 61 and 64 in 09/2021). LFTs normal.  He is also on a lower dose of clobazam 10mg  qhs. He continues on Vimpat 150mg  BID, Levetiracetam 2000mg  BID, Lamotrigine 200mg  TID, and Pregabalin 225mg  TID. His mother reports an improvement in the sedation, she can tell a difference, he still has drowsiness but not to the extent like before. He was having more right ptosis, EMG/NCV was normal. He continues to have daily seizures and had one in the office today. He has had 2 falls from seizures in the past 3 months, most recently 2 weeks ago. His mother has noticed he tends to have more seizures in the early evening between 6-8pm when he takes his evening medications. He does not know he is having a seizure, no warning. He reports occasional dizziness, no headaches. No ptosis today.    Seizure History: This is a 46 yo RH man with a history of seizures since age 41. He has no recollection of events, no prior warning, witnessed by his mother to have a generalized convulsion lasting 90 to 120 seconds. He was brought to [facility] then had another convulsion 2 days later. They recall trying different medications, Depakote caused liver dysfunction, he has failed Dilantin and Zonegran. He has been on Keppra, Lamictal, Lyrica, and most recently Vimpat.  He had an EMU admission at [facility], records unavailable for review, per notes he stayed  for 36 hours and had generalized seizures, multiple foci.  They report two admissions for status epilepticus, one in September 2002 in the setting of weaning off Depakote, and another in January 2003.  Records unavailable for review.  His last GTC was around 5 years ago. He continued to have "petit mals" several times a day where his eyes would roll back, hands would shake for 30-45 seconds, if standing he would fall and had required sutures and staples in the past. He had been having the "petit mals" several times daily until 3 weeks ago when he started having a different type of episode and the petit mals "completely stopped."  He was brought to the ER on 12/07/13 when his parents awoke to him dry having and speaking gibberish. He would be unable to speak, control his limbs, and cannot stand up without assistance. He can hear people around him but his jaw feels tight. The episodes can last for several hours. He has had 4 episodes in the past 3 weeks.  The patient reports that they have been occurring only during the weekends, however his parents remind him he had one on Wednesday. He went to his neurologist's office on 09/10 where he had an episode while walking to the exam room where he became dizzy and fell.  His father was able to catch him, and they reported his speech became affected. He was noted to be speaking in a whisper throughout the visit.  He does note that he becomes more dizzy after taking his  medications.  Lamictal level was 17.8, Vimpat level 7.3.   The patient lives with his parents and brother. He is on disability and mostly stays at home playing video games. He graduated high school, no special ed classes. He endorses olfactory hallucinations but cannot describe it except saying they are the same all the time.   Epilepsy Risk Factors:  He had a skull fracture on the right side after a fall at 40 months of age. Otherwise he had a normal birth and early development.  There is no history of  febrile convulsions, CNS infections such as meningitis/encephalitis, neurosurgical procedures, or family history of seizures.  Prior AEDs: Depakote, Dilantin, Zonegran  EEGs: 48-hour EEG (03/17/14 to 03/19/14) abnormal with multifocal discharges and right temporal slowing. Prior records note "generalized seizures, multiple foci" He had a 72-hour EEG (09/26/16 to 09/29/16) which was abnormal with bursts and runs of focal slowing over the right temporal region, bursts and runs of diffuse rhythmic slowing with intermixed generalized spikes lasting up to 120 seconds without clinical correlate, multifocal epileptiform discharges seen over the right temporal, left frontal regions, as well as generalized spikes and polyspikes. During sleep, there were bursts of generalized fast activity. There were 2 clinicoelectrographic seizures captured with right head turn and some hypermotor activity that were non-lateralizing on EEG with diffuse background suppression at seizure onset.  MRI: I personally reviewed MRI brain with and without contrast done 12/26/2013 which did not show any acute intracranial abnormality, hippocampi symmetric without abnormal signal or enhancement.  He has had an extensive evaluation at [facility] in 2019 with repeat MRI brain 07/2017 showing no clear abnormality, PET scan with questionable decreased uptake on the left, Neuropsych testing suggesting more right-sided dysfunction. He underwent sEEG which was non-lateralizing, broad activity at seizure onset with low voltage fast activity from left and right, insular origin also suspicious. He had DBS placed last 10/2017 for non-resective stimulation treatment.    PAST MEDICAL HISTORY: Past Medical History:  Diagnosis Date   History of kidney stones    Seizures ([place])    daily    MEDICATIONS: Current Outpatient Medications on File Prior to Visit  Medication Sig Dispense Refill   cannabidiol (EPIDIOLEX) 100 MG/ML solution Take 9 mL twice a day  (Patient taking differently: Take 7 mL twice a day) 540 mL 5   cloBAZam (ONFI) 10 MG tablet Take 1 tablet at bedtime 90 tablet 3   escitalopram (LEXAPRO) 10 MG tablet Take 1 tablet (10 mg total) by mouth daily. 30 tablet 6   Lacosamide 100 MG TABS TAKE ONE AND A HALF TABLETS BY MOUTH 2 TIMES DAILY 90 tablet 5   lamoTRIgine (LAMICTAL) 100 MG tablet TAKE 2 TABLETS BY MOUTH 3 TIMES DAILY 180 tablet 11   levETIRAcetam (KEPPRA) 1000 MG tablet TAKE 2 TABLETS BY MOUTH 2 TIMES DAILY 120 tablet 11   levOCARNitine (CARNITOR) 1 GM/10ML solution Take 3.48mL twice a day 118 mL 12   pregabalin (LYRICA) 225 MG capsule TAKE 1 CAPSULE BY MOUTH 3 TIMES DAILY 270 capsule 3   No current facility-administered medications on file prior to visit.    ALLERGIES: Allergies  Allergen Reactions   Penicillins Rash    Has patient had a PCN reaction causing immediate rash, facial/tongue/throat swelling, SOB or lightheadedness with hypotension: No Has patient had a PCN reaction causing severe rash involving mucus membranes or skin necrosis: No Has patient had a PCN reaction that required hospitalization No Has patient had a PCN reaction occurring within the  last 10 years: No If all of the above answers are "NO", then may proceed with Cephalosporin use.     FAMILY HISTORY: Family History  Problem Relation Age of Onset   Healthy Mother    Healthy Father     SOCIAL HISTORY: Social History   Socioeconomic History   Marital status: Single    Spouse name: Not on file   Number of children: 0   Years of education: HS   Highest education level: Not on file  Occupational History   Not on file  Tobacco Use   Smoking status: Former    Packs/day: 0.50    Years: 4.00    Total pack years: 2.00    Types: Cigarettes    Quit date: 03/03/1995    Years since quitting: 26.8   Smokeless tobacco: Never  Vaping Use   Vaping Use: Never used  Substance and Sexual Activity   Alcohol use: No    Alcohol/week: 0.0 standard  drinks of alcohol   Drug use: No   Sexual activity: Not on file  Other Topics Concern   Not on file  Social History Narrative   Patient is single and lives by his self  Single story just steps to get on front porch dose not use front steps.   Patient has a high school education.   Patient is right-handed.   Patient does not have any children.   Patient is on disability.   Patient drinks three sodas daily.   Social Determinants of Health   Financial Resource Strain: Not on file  Food Insecurity: No Food Insecurity (01/03/2022)   Hunger Vital Sign    Worried About Running Out of Food in the Last Year: Never true    Ran Out of Food in the Last Year: Never true  Transportation Needs: No Transportation Needs (01/03/2022)   PRAPARE - Administrator, Civil Service (Medical): No    Lack of Transportation (Non-Medical): No  Physical Activity: Not on file  Stress: Not on file  Social Connections: Not on file  Intimate Partner Violence: Not on file     PHYSICAL EXAM: Vitals:   01/04/22 1516  BP: 122/76  Pulse: 69  SpO2: 95%   General: No acute distress. He had a seizure during the visit with head turn to the right, both arms flexed with slight jerking lasting 10 seconds or so. No post-ictal confusion, he was able to answer questions soon after. Head:  Normocephalic/atraumatic Skin/Extremities: No rash, no edema Neurological Exam: alert and awake. No aphasia or dysarthria. Attention and concentration are normal.   Cranial nerves: Pupils equal, round. Extraocular movements intact with no nystagmus. No ptosis today. Visual fields full.  No facial asymmetry.  Motor: Bulk and tone normal, muscle strength 5/5 throughout with no pronator drift.   Finger to nose testing intact.  Gait narrow-based and steady, no ataxia  VNS Therapy Management: Parameters Output Current (mA): 2 Signal Frequency (Hz): 20 Pulse Width (usec): 250 Signal ON Time (sec): 30 Signal OFF Time (min):  3 Magnet Output Current (mA): 2.25 Magnet ON Time (sec): 60 Magnet Pulse Width (usec): 500 AutoStim Output Current (mA): 2.25 AutoStim Pulse Width (usec): 250 AutoStim ON Time (sec): 60 Tachycardia Detection : On Heartbeat Detection Sensitivity: 3 Perform Verify Heartbeat Detection: yes Threshold for AutoStim (%): 20% Diagnostics Output Current: ok Current Delievered (mA): 2 Lead Impedance: OK Impedence Value (Ohms): 2146 Battery Status Indicator (color): Green (11-25%)   IMPRESSION: This is a 46 yo  RH man with intractable multifocal epilepsy. Family reports seizures started at age 18. He is on 6 ASMs in addition to VNS and DBS with continued seizures. Lethargy/sedation has improved with reduction in Epidiolex dose and addition of levocarnitine. Repeat ammonia level is slightly lower, LFTs normal. He continues to have daily seizures and had a brief focal seizure with head turn to the right in the office today. We discussed adding on Cenobamate, side effects discussed. He had a honeymoon period with Epidiolex however it is unclear how much benefit he is getting from it, we will plan to start weaning off before starting Cenobamate to streamline medications. Reduce Epidiolex to 83mL BID. Continue clobazam 10mg  qhs, Vimpat 150mg  BID, Levetiracetam 2000mg  BID, Lamotrigine 200mg  TID, and Pregabalin 225mg  TID. We may need to reduce Vimpat dose due to potential dizziness when in conjunction with Cenobamate. VNS interrogated today, no changes made, battery life 11-25%. They were instructed to swipe the magnet three times a day even if no seizures. Follow-up in 3 months, call for any changes.    Thank you for allowing me to participate in his care.  Please do not hesitate to call for any questions or concerns.    , M.D.   CC: Dr. 

## 2022-01-05 ENCOUNTER — Encounter: Payer: Self-pay | Admitting: Neurology

## 2022-01-05 DIAGNOSIS — G40211 Localization-related (focal) (partial) symptomatic epilepsy and epileptic syndromes with complex partial seizures, intractable, with status epilepticus: Secondary | ICD-10-CM

## 2022-01-05 MED ORDER — EPIDIOLEX 100 MG/ML PO SOLN: ORAL | 5 refills | Status: DC

## 2022-01-11 ENCOUNTER — Other Ambulatory Visit: Payer: Self-pay | Admitting: Neurology

## 2022-01-19 ENCOUNTER — Encounter: Payer: Self-pay | Admitting: Neurology

## 2022-01-20 ENCOUNTER — Other Ambulatory Visit (HOSPITAL_COMMUNITY): Payer: Self-pay

## 2022-01-24 ENCOUNTER — Other Ambulatory Visit (HOSPITAL_COMMUNITY): Payer: Self-pay

## 2022-01-25 ENCOUNTER — Other Ambulatory Visit (HOSPITAL_COMMUNITY): Payer: Self-pay

## 2022-01-27 ENCOUNTER — Telehealth: Payer: Self-pay | Admitting: Pharmacy Technician

## 2022-01-27 NOTE — Telephone Encounter (Signed)
-----   Message from Heather M Stanley, LPN sent at 01/19/2022 12:06 PM EDT ----- Regarding: PA Pt mother called stated PA for epidiolex runs out in December and he will need a new one,   

## 2022-01-27 NOTE — Telephone Encounter (Signed)
-----   Message from Wilder Glade, LPN sent at 90/38/3338 12:06 PM EDT ----- Regarding: PA Pt mother called stated PA for epidiolex runs out in December and he will need a new one,

## 2022-02-03 ENCOUNTER — Other Ambulatory Visit (HOSPITAL_COMMUNITY): Payer: Self-pay

## 2022-02-08 ENCOUNTER — Other Ambulatory Visit (HOSPITAL_COMMUNITY): Payer: Self-pay

## 2022-02-09 ENCOUNTER — Other Ambulatory Visit (HOSPITAL_COMMUNITY): Payer: Self-pay

## 2022-02-09 NOTE — Telephone Encounter (Signed)
Tried to go ahead and process Epidiolex to renew prior authorization but it denied it as the current authorization is still active .

## 2022-03-14 ENCOUNTER — Other Ambulatory Visit (HOSPITAL_COMMUNITY): Payer: Self-pay

## 2022-03-14 NOTE — Telephone Encounter (Signed)
Patient Advocate Encounter  Tried to re-submit for prior authorization for Epidiolex.   Prior authorization returned denied again as current authorization is still active. Tried to find a date, but unable to do so.   KEY [id number]

## 2022-03-17 ENCOUNTER — Encounter: Payer: Self-pay | Admitting: *Deleted

## 2022-03-17 ENCOUNTER — Telehealth: Payer: Self-pay | Admitting: Neurology

## 2022-03-17 DIAGNOSIS — G40211 Localization-related (focal) (partial) symptomatic epilepsy and epileptic syndromes with complex partial seizures, intractable, with status epilepticus: Secondary | ICD-10-CM

## 2022-03-17 NOTE — Telephone Encounter (Signed)
Pt's mother called in stating she just spoke with [facility] and they need our office to call them at [phone]. They need an update on the dosage of his Epidiolex.

## 2022-03-18 MED ORDER — EPIDIOLEX 100 MG/ML PO SOLN: ORAL | 5 refills | Status: DC

## 2022-03-18 NOTE — Telephone Encounter (Signed)
Adria from [facility] left a message after hours requesting a call back regarding pt's Rx for Epidiolex, they need the correct dosage. Call back number is [phone].

## 2022-03-18 NOTE — Telephone Encounter (Signed)
I sent another Rx reflecting 41mL BID dose, similar to Rx from 01/2022. Pls let them know, thanks

## 2022-03-29 ENCOUNTER — Other Ambulatory Visit: Payer: Self-pay | Admitting: Neurology

## 2022-04-09 ENCOUNTER — Other Ambulatory Visit: Payer: Self-pay | Admitting: Neurology

## 2022-04-09 DIAGNOSIS — G40211 Localization-related (focal) (partial) symptomatic epilepsy and epileptic syndromes with complex partial seizures, intractable, with status epilepticus: Secondary | ICD-10-CM

## 2022-04-10 ENCOUNTER — Other Ambulatory Visit: Payer: Self-pay

## 2022-04-10 ENCOUNTER — Emergency Department (HOSPITAL_BASED_OUTPATIENT_CLINIC_OR_DEPARTMENT_OTHER)
Admission: EM | Admit: 2022-04-10 | Discharge: 2022-04-11 | Disposition: A | Discharge status: Home / Self Care | Payer: Medicare Other | Attending: Emergency Medicine | Admitting: Emergency Medicine

## 2022-04-10 ENCOUNTER — Encounter (HOSPITAL_BASED_OUTPATIENT_CLINIC_OR_DEPARTMENT_OTHER): Payer: Self-pay

## 2022-04-10 DIAGNOSIS — G40909 Epilepsy, unspecified, not intractable, without status epilepticus: Secondary | ICD-10-CM | POA: Diagnosis not present

## 2022-04-10 DIAGNOSIS — Z79899 Other long term (current) drug therapy: Secondary | ICD-10-CM | POA: Diagnosis not present

## 2022-04-10 DIAGNOSIS — Z23 Encounter for immunization: Secondary | ICD-10-CM | POA: Insufficient documentation

## 2022-04-10 DIAGNOSIS — S01111A Laceration without foreign body of right eyelid and periocular area, initial encounter: Secondary | ICD-10-CM | POA: Diagnosis not present

## 2022-04-10 DIAGNOSIS — R569 Unspecified convulsions: Secondary | ICD-10-CM | POA: Diagnosis not present

## 2022-04-10 DIAGNOSIS — W01190A Fall on same level from slipping, tripping and stumbling with subsequent striking against furniture, initial encounter: Secondary | ICD-10-CM | POA: Diagnosis not present

## 2022-04-10 MED ORDER — LIDOCAINE-EPINEPHRINE (PF) 2 %-1:200000 IJ SOLN
20.0000 mL | Freq: Once | INTRAMUSCULAR | Status: AC
  Administered 2022-04-10: 20 mL
  Filled 2022-04-10: qty 20

## 2022-04-10 MED ORDER — TETANUS-DIPHTH-ACELL PERTUSSIS 5-2.5-18.5 LF-MCG/0.5 IM SUSY
0.5000 mL | PREFILLED_SYRINGE | Freq: Once | INTRAMUSCULAR | Status: AC
  Administered 2022-04-10: 0.5 mL via INTRAMUSCULAR
  Filled 2022-04-10: qty 0.5

## 2022-04-10 NOTE — ED Provider Notes (Signed)
[name] EMERGENCY DEPT Provider Note   CSN: [id number] Arrival date & time: 04/10/22  1940     History {Add pertinent medical, surgical, social history, OB history to HPI:1} Chief Complaint  Patient presents with   Seizures   Fall    Gregg Young is a 47 y.o. male.  HPI     This is a 47 year old male who presents after a fall.  Patient presents with his mother.  Patient has poorly controlled epilepsy and has seizures daily.  He is on 7 medications.  Mother reports that she heard him fall.  They state that if he is standing up and has a seizure he typically will fall.  He has had multiple prior injuries in the past.  He fell hitting his right eye.  He does not have any recollection of the events.  He is not on any blood thinners.  He has not had any nausea or vomiting.  He did note some injury to the right eyebrow.  Denies pain elsewhere.  No recent illnesses.  Home Medications Prior to Admission medications   Medication Sig Start Date End Date Taking? Authorizing Provider  cannabidiol (EPIDIOLEX) 100 MG/ML solution Take 5 mL twice a day 03/18/22   Cameron Sprang, MD  cloBAZam (ONFI) 10 MG tablet Take 1 tablet at bedtime 10/08/21   Cameron Sprang, MD  escitalopram (LEXAPRO) 10 MG tablet TAKE 1 TABLET BY MOUTH EVERY DAY 01/11/22   Cameron Sprang, MD  Lacosamide 100 MG TABS TAKE 1 AND 1/2 TABLETS BY MOUTH 2 TIMES DAILY 03/29/22   Cameron Sprang, MD  lamoTRIgine (LAMICTAL) 100 MG tablet TAKE 2 TABLETS BY MOUTH 3 TIMES DAILY 08/13/21   Cameron Sprang, MD  levETIRAcetam (KEPPRA) 1000 MG tablet TAKE 2 TABLETS BY MOUTH 2 TIMES DAILY 08/13/21   Cameron Sprang, MD  levOCARNitine (CARNITOR) 1 GM/10ML solution Take 3.40mL twice a day 10/19/21   Cameron Sprang, MD  pregabalin (LYRICA) 225 MG capsule TAKE 1 CAPSULE BY MOUTH 3 TIMES DAILY 11/01/21   Cameron Sprang, MD      Allergies    Penicillins    Review of Systems   Review of Systems  Constitutional:  Negative for  fever.  Cardiovascular:  Negative for chest pain.  Skin:  Positive for wound.  Neurological:  Positive for seizures.  All other systems reviewed and are negative.   Physical Exam Updated Vital Signs BP (!) 145/81   Pulse 80   Temp 98.1 F (36.7 C) (Oral)   Resp 18   Ht 1.676 m (5\' 6" )   Wt 113.4 kg   SpO2 97%   BMI 40.35 kg/m  Physical Exam Vitals and nursing note reviewed.  Constitutional:      Appearance: He is well-developed. He is obese. He is not ill-appearing.  HENT:     Head: Normocephalic.     Comments: 3 cm lacerations through the mid right eyebrow, no active bleeding Eyes:     Extraocular Movements: Extraocular movements intact.     Pupils: Pupils are equal, round, and reactive to light.  Neck:     Comments: No midline C-spine tenderness palpation, step-off, deformity Cardiovascular:     Rate and Rhythm: Normal rate and regular rhythm.     Heart sounds: Normal heart sounds. No murmur heard. Pulmonary:     Effort: Pulmonary effort is normal. No respiratory distress.     Breath sounds: Normal breath sounds. No wheezing.  Abdominal:  Palpations: Abdomen is soft.     Tenderness: There is no abdominal tenderness. There is no rebound.  Musculoskeletal:     Cervical back: Normal range of motion and neck supple.  Skin:    General: Skin is warm and dry.  Neurological:     Mental Status: He is alert and oriented to person, place, and time.     Comments: Cranial nerves II through XII intact, 5 out of 5 strength in all 4 extremities, no dysmetria to finger-nose-finger  Psychiatric:        Mood and Affect: Mood normal.     ED Results / Procedures / Treatments   Labs (all labs ordered are listed, but only abnormal results are displayed) Labs Reviewed  CBC WITH DIFFERENTIAL/PLATELET  BASIC METABOLIC PANEL    EKG None  Radiology No results found.  Procedures Procedures  {Document cardiac monitor, telemetry assessment procedure when  appropriate:1}  Medications Ordered in ED Medications  lidocaine-EPINEPHrine (XYLOCAINE W/EPI) 2 %-1:200000 (PF) injection 20 mL (has no administration in time range)  Tdap (BOOSTRIX) injection 0.5 mL (has no administration in time range)    ED Course/ Medical Decision Making/ A&P                           Medical Decision Making Amount and/or Complexity of Data Reviewed Labs: ordered.  Risk Prescription drug management.   ***  {Document critical care time when appropriate:1} {Document review of labs and clinical decision tools ie heart score, Chads2Vasc2 etc:1}  {Document your independent review of radiology images, and any outside records:1} {Document your discussion with family members, caretakers, and with consultants:1} {Document social determinants of health affecting pt's care:1} {Document your decision making why or why not admission, treatments were needed:1} Final Clinical Impression(s) / ED Diagnoses Final diagnoses:  None    Rx / DC Orders ED Discharge Orders     None

## 2022-04-10 NOTE — ED Notes (Signed)
PT and family refused blood work for [employer]. PT states "I will get blood work done Friday at my Neurologist I don't want it done tonight"

## 2022-04-10 NOTE — ED Triage Notes (Signed)
Patient arrives to ED POV c/o Left eyebrow laceration after having a seizure causing him to fall and hit his head on "furniture". Per the pts mother she was not there to witness but heard him fall and states that he was actively seizing. Pts mother states that he has a seizure everyday.

## 2022-04-11 ENCOUNTER — Encounter: Payer: Self-pay | Admitting: Family Medicine

## 2022-04-11 NOTE — Telephone Encounter (Signed)
Pt seeing dr Randol Kern on 04/18/22 at 4 pm

## 2022-04-11 NOTE — Telephone Encounter (Signed)
Caller states: - Patient is needing to have sutures removed either on 01/15 or 01/16 - Patient would need appointment in afternoon due to have seizure activity in the mornings   PCP has an opening on 04/18/22 @ 4pm. May pt be scheduled at this time?

## 2022-04-11 NOTE — Discharge Instructions (Signed)
You were seen today for seizure and eyelid laceration.  Continue your seizure medications.  Make sure that you are not driving and you are keeping yourself safe.  You will need to have suture removal in approximately 1 week.  You may return here or go to your primary physician.

## 2022-04-15 ENCOUNTER — Telehealth: Payer: Self-pay

## 2022-04-15 ENCOUNTER — Encounter: Payer: Self-pay | Admitting: Neurology

## 2022-04-15 ENCOUNTER — Telehealth (INDEPENDENT_AMBULATORY_CARE_PROVIDER_SITE_OTHER): Payer: Medicare Other | Admitting: Neurology

## 2022-04-15 VITALS — Ht 66.0 in | Wt 250.0 lb

## 2022-04-15 DIAGNOSIS — G40211 Localization-related (focal) (partial) symptomatic epilepsy and epileptic syndromes with complex partial seizures, intractable, with status epilepticus: Secondary | ICD-10-CM | POA: Diagnosis not present

## 2022-04-15 NOTE — Progress Notes (Signed)
Medication Samples have been provided to the patient.  Drug name: xcopri       Strength: 12.5/25        Qty: 1 box  LOT: [id number]  Exp.Date: 9/24  Dosing instructions: as directed  The patient has been instructed regarding the correct time, dose, and frequency of taking this medication, including desired effects and most common side effects.

## 2022-04-15 NOTE — Patient Instructions (Signed)
Good to see you.  Start Xcopri 12.5mg : take 1 tablet every night for 2 weeks, then take 25mg : 1 tablet every night for 2 weeks.  2. Stop the Carnitor  3. Continue all other medications for now. Depending on how you are feeling, we will start reducing either the Epidiolex or Vimpat  4. Follow-up in 3 months, call for any changes.

## 2022-04-15 NOTE — Telephone Encounter (Signed)
     Patient  visit on 04/11/2022  at [facility] was for seizure, laceration of right eyebrow, initial encounter.  Have you been able to follow up with your primary care physician? Has appointment 04/18/2022.  The patient was or was not able to obtain any needed medicine or equipment. No medications prescribed.  Are there diet recommendations that you are having difficulty following? No  Patient expresses understanding of discharge instructions and education provided has no other needs at this time.    [facility] Resource Care Guide   [email or web address]  ?? [phone]   Website: [email or web address]  [email or web address]

## 2022-04-15 NOTE — Progress Notes (Signed)
Virtual Visit via Video Note The purpose of this virtual visit is to provide medical care while limiting exposure to the novel coronavirus.    Consent was obtained for video visit:  Yes.   Answered questions that patient had about telehealth interaction:  Yes.    Pt location: Home Physician Location: office Name of referring provider:  Shelva Majestic, MD I connected with Gregg Young at patients initiation/request on 04/15/2022 at  4:00 PM EST by video enabled telemedicine application and verified that I am speaking with the correct person using two identifiers. Pt MRN:  [id number] Pt DOB:  Sep 28, 1975 Video Participants:  Gregg Young;  Josefa Half   History of Present Illness:  The patient had a virtual video visit on 04/15/2022. He was last seen in the neurology clinic 3 months ago for intractable epilepsy. His mother is present to provide additional information. On his last visit, they reported improvement in lethargy/sedation with reduction in Epidiolex. Dose reduced further to 73mL BID. He continues on Vimpat 150mg  BID, Levetiracetam 2000mg  BID, Lamotrigine 200mg  TID, and Pregabalin 225mg  TID. The sedation has pretty much resolved, he is waking up earlier now. There was no increase in seizures with reduction in Epidiolex, he still has 3-4 small seizures daily, but had only 1 fall from a seizure in the past 3 months, this occurred last Sunday. He was started on levocarnitine for hyperammonemia, sometimes he may miss days at a time when delivery has not arrived. He denies any headaches, dizziness, focal numbness/tingling/weakness. His mother reports right eyelid is still drooping, no diplopia, dysarthria/dysphagia.    Seizure History: This is a 47 yo RH man with a history of seizures since age 44. He has no recollection of events, no prior warning, witnessed by his mother to have a generalized convulsion lasting 90 to 120 seconds. He was brought to [facility] then had another convulsion 2  days later. They recall trying different medications, Depakote caused liver dysfunction, he has failed Dilantin and Zonegran. He has been on Keppra, Lamictal, Lyrica, and most recently Vimpat.  He had an EMU admission at [facility], records unavailable for review, per notes he stayed for 36 hours and had generalized seizures, multiple foci.  They report two admissions for status epilepticus, one in September 2002 in the setting of weaning off Depakote, and another in January 2003.  Records unavailable for review.  His last GTC was around 5 years ago. He continued to have "petit mals" several times a day where his eyes would roll back, hands would shake for 30-45 seconds, if standing he would fall and had required sutures and staples in the past. He had been having the "petit mals" several times daily until 3 weeks ago when he started having a different type of episode and the petit mals "completely stopped."  He was brought to the ER on 12/07/13 when his parents awoke to him dry having and speaking gibberish. He would be unable to speak, control his limbs, and cannot stand up without assistance. He can hear people around him but his jaw feels tight. The episodes can last for several hours. He has had 4 episodes in the past 3 weeks.  The patient reports that they have been occurring only during the weekends, however his parents remind him he had one on Wednesday. He went to his neurologist's office on 09/10 where he had an episode while walking to the exam room where he became dizzy and fell.  His father was able  to catch him, and they reported his speech became affected. He was noted to be speaking in a whisper throughout the visit.  He does note that he becomes more dizzy after taking his medications.  Lamictal level was 17.8, Vimpat level 7.3.   The patient lives with his parents and brother. He is on disability and mostly stays at home playing video games. He graduated high school, no special ed classes. He  endorses olfactory hallucinations but cannot describe it except saying they are the same all the time.   Epilepsy Risk Factors:  He had a skull fracture on the right side after a fall at 31 months of age. Otherwise he had a normal birth and early development.  There is no history of febrile convulsions, CNS infections such as meningitis/encephalitis, neurosurgical procedures, or family history of seizures.  Prior AEDs: Depakote, Dilantin, Zonegran  EEGs: 48-hour EEG (03/17/14 to 03/19/14) abnormal with multifocal discharges and right temporal slowing. Prior records note "generalized seizures, multiple foci" He had a 72-hour EEG (09/26/16 to 09/29/16) which was abnormal with bursts and runs of focal slowing over the right temporal region, bursts and runs of diffuse rhythmic slowing with intermixed generalized spikes lasting up to 120 seconds without clinical correlate, multifocal epileptiform discharges seen over the right temporal, left frontal regions, as well as generalized spikes and polyspikes. During sleep, there were bursts of generalized fast activity. There were 2 clinicoelectrographic seizures captured with right head turn and some hypermotor activity that were non-lateralizing on EEG with diffuse background suppression at seizure onset.  MRI: I personally reviewed MRI brain with and without contrast done 12/26/2013 which did not show any acute intracranial abnormality, hippocampi symmetric without abnormal signal or enhancement.  He has had an extensive evaluation at [facility] in 2019 with repeat MRI brain 07/2017 showing no clear abnormality, PET scan with questionable decreased uptake on the left, Neuropsych testing suggesting more right-sided dysfunction. He underwent sEEG which was non-lateralizing, broad activity at seizure onset with low voltage fast activity from left and right, insular origin also suspicious. He had DBS placed last 10/2017 for non-resective stimulation treatment.    Current  Outpatient Medications on File Prior to Visit  Medication Sig Dispense Refill   cannabidiol (EPIDIOLEX) 100 MG/ML solution Take 5 mL twice a day 300 mL 5   cloBAZam (ONFI) 10 MG tablet TAKE 1 TABLET BY MOUTH AT BEDTIME 90 tablet 3   escitalopram (LEXAPRO) 10 MG tablet TAKE 1 TABLET BY MOUTH EVERY DAY 30 tablet 3   Lacosamide 100 MG TABS TAKE 1 AND 1/2 TABLETS BY MOUTH 2 TIMES DAILY 90 tablet 5   lamoTRIgine (LAMICTAL) 100 MG tablet TAKE 2 TABLETS BY MOUTH 3 TIMES DAILY 180 tablet 11   levETIRAcetam (KEPPRA) 1000 MG tablet TAKE 2 TABLETS BY MOUTH 2 TIMES DAILY 120 tablet 11   levOCARNitine (CARNITOR) 1 GM/10ML solution Take 3.55mL twice a day 118 mL 12   pregabalin (LYRICA) 225 MG capsule TAKE 1 CAPSULE BY MOUTH 3 TIMES DAILY 270 capsule 3   No current facility-administered medications on file prior to visit.     Observations/Objective:   Vitals:   04/15/22 1538  Weight: 250 lb (113.4 kg)  Height: 5\' 6"  (1.676 m)   GEN:  The patient appears stated age and is in NAD.  Neurological examination: Patient is awake, alert. No aphasia or dysarthria. Intact fluency and comprehension. Cranial nerves: Extraocular movements intact. No facial asymmetry. Motor: moves all extremities symmetrically, at least anti-gravity x 4.  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 Family reports seizures started at age 24. He is on 6 ASMs in addition to VNS and DBS with continued seizures. Lethargy/sedation has improved with reduction in Epidiolex. Hyperammonemia improved with levocarnitine. Since he is no longer sedated, we discussed stopping the levocarnitine. We had discussed starting Xcopri, however would streamline medications and wean off Epidiolex. His mother is concerned about stopping medications, we agreed to start the Xcopri 12.5mg  qhs x 2 weeks, then 25mg  qhs x 2 weeks and monitor symptoms. If he becomes lethargic again, we will start weaning off Epidiolex. If he  becomes too dizzy, we will reduce Vimpat dose first. Taking Vimpat in conjuction with Raye Sorrow can sometimes cause dizziness. For now continue Epidiolex to 34mL BID, clobazam 10mg  qhs, Vimpat 150mg  BID, Levetiracetam 2000mg  BID, Lamotrigine 200mg  TID, and Pregabalin 225mg  TID. Follow-up in 3 months, call for any changes.    Follow Up Instructions:   -I discussed the assessment and treatment plan with the patient. The patient was provided an opportunity to ask questions and all were answered. The patient agreed with the plan and demonstrated an understanding of the instructions.   The patient was advised to call back or seek an in-person evaluation if the symptoms worsen or if the condition fails to improve as anticipated.     Cameron Sprang, MD

## 2022-04-18 ENCOUNTER — Encounter: Payer: Self-pay | Admitting: Internal Medicine

## 2022-04-18 ENCOUNTER — Ambulatory Visit (INDEPENDENT_AMBULATORY_CARE_PROVIDER_SITE_OTHER): Payer: 59 | Admitting: Internal Medicine

## 2022-04-18 VITALS — BP 124/78 | HR 67 | Temp 97.3°F | Ht 66.0 in | Wt 252.0 lb

## 2022-04-18 DIAGNOSIS — S01111A Laceration without foreign body of right eyelid and periocular area, initial encounter: Secondary | ICD-10-CM

## 2022-04-18 DIAGNOSIS — T148XXA Other injury of unspecified body region, initial encounter: Secondary | ICD-10-CM

## 2022-04-18 DIAGNOSIS — Z4802 Encounter for removal of sutures: Secondary | ICD-10-CM | POA: Insufficient documentation

## 2022-04-18 DIAGNOSIS — T148XXD Other injury of unspecified body region, subsequent encounter: Secondary | ICD-10-CM

## 2022-04-18 DIAGNOSIS — S01111D Laceration without foreign body of right eyelid and periocular area, subsequent encounter: Secondary | ICD-10-CM

## 2022-04-18 NOTE — Progress Notes (Signed)
[facility]: [phone]   Routine Medical Office Visit  Patient:  Gregg Young      Age: 47 y.o.       Sex:  male  Date:   04/18/2022  PCP:    Marin Olp, MD   [place] Provider: Loralee Pacas, MD   Problem Focused Charting:   Medical Decision Making per Assessment/Plan   Visit for suture removal  Laceration of right eyebrow, subsequent encounter  Encounter for re-check of laceration wound  Patient presents for suture removal and wound recheck.  The wound is well healed without signs of infection.  5  sutures are removed after disinfecting and spraying with numbing spray.  Patient tolerated well- minimal pain, no bleeding or other complications. Wound care and activity instructions given - advised he can just use neosporin one more time. Return prn.     Subjective - Clinical Presentation:   Gregg Young is a 47 y.o. 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   Obstructive sleep apnea 05/08/2017   Excessive daytime sleepiness 02/28/2017   Dizziness and giddiness 07/08/2014   Focal epilepsy with impairment of consciousness, intractable ([place]) 01/15/2014   Partial symptomatic epilepsy ([place]) 07/13/2012   Past Medical History:  Diagnosis Date   History of kidney stones    Seizures (HCC)    daily    Outpatient Medications Prior to Visit  Medication Sig   cannabidiol (EPIDIOLEX) 100 MG/ML solution Take 5 mL twice a day   cloBAZam (ONFI) 10 MG tablet TAKE 1 TABLET BY MOUTH AT BEDTIME   escitalopram (LEXAPRO) 10 MG tablet TAKE 1 TABLET BY MOUTH EVERY DAY   Lacosamide 100 MG TABS TAKE 1 AND 1/2 TABLETS BY MOUTH 2 TIMES DAILY   lamoTRIgine (LAMICTAL) 100 MG tablet TAKE 2 TABLETS BY MOUTH 3 TIMES DAILY   levETIRAcetam (KEPPRA) 1000 MG tablet TAKE 2 TABLETS BY MOUTH 2 TIMES DAILY   levOCARNitine (CARNITOR) 1 GM/10ML solution  Take 3.51mL twice a day   pregabalin (LYRICA) 225 MG capsule TAKE 1 CAPSULE BY MOUTH 3 TIMES DAILY   No facility-administered medications prior to visit.    Chief Complaint  Patient presents with   Suture / Staple Removal    HPI  Patient reporting he only need stitches removed that were placed April 10, 2022.  Emergency room visit reviewed he had a seizure.  He reports he has a seizure every day they do not even change the medications anymore.  He will fall if he is standing up when it happens.  He is not taking antibiotics just putting Neosporin on it the sutured R eyebrow laceration and it is healing up well  Emergency room note is hard to find how many sutures were placed but he thinks 5 and it looks like 5.       Objective:  Physical Exam  BP 124/78 (BP Location: Left Arm, Patient Position: Sitting)   Pulse 67   Temp (!) 97.3 F (36.3 C) (Temporal)   Ht 5\' 6"  (1.676 m)   Wt 252 lb (114.3 kg)   SpO2 95%   BMI 40.67 kg/m  Severely obese  by BMI criteria but truncal adiposity (waist circumference or caliper) should be used instead. Wt Readings from Last 10 Encounters:  04/18/22 252 lb (114.3 kg)  04/15/22 250 lb (113.4 kg)  04/10/22 250 lb (113.4 kg)  01/04/22 248  lb 9.6 oz (112.8 kg)  10/06/21 256 lb (116.1 kg)  09/06/21 256 lb (116.1 kg)  08/13/21 262 lb (118.8 kg)  07/27/21 267 lb (121.1 kg)  07/23/21 263 lb (119.3 kg)  12/28/20 263 lb (119.3 kg)   Vital signs reviewed.  Nursing notes reviewed. Weight trend reviewed. General Appearance:  Well developed, well nourished male in no acute distress.   Normal work of breathing at rest Musculoskeletal: All extremities are intact.  Neurological:  Awake, alert,  No obvious focal neurological deficits or cognitive impairments Psychiatric:  Appropriate mood, pleasant demeanor Problem-specific findings:  well healing R eyebrow laceration with no erythema, tenderness, or purulence    Signed: Loralee Pacas, MD 04/18/2022 4:27  PM

## 2022-04-24 ENCOUNTER — Encounter: Payer: Self-pay | Admitting: Neurology

## 2022-04-27 ENCOUNTER — Other Ambulatory Visit: Payer: Self-pay | Admitting: Neurology

## 2022-05-03 ENCOUNTER — Other Ambulatory Visit: Payer: Self-pay | Admitting: Neurology

## 2022-05-06 ENCOUNTER — Telehealth: Payer: Self-pay

## 2022-05-06 ENCOUNTER — Encounter: Payer: Self-pay | Admitting: Neurology

## 2022-05-06 ENCOUNTER — Other Ambulatory Visit: Payer: Self-pay | Admitting: Neurology

## 2022-05-06 MED ORDER — LACOSAMIDE 150 MG PO TABS: ORAL_TABLET | ORAL | 3 refills | Status: DC

## 2022-05-06 NOTE — Telephone Encounter (Signed)
PA request received via CMM for Pregabalin 225MG  capsules  PA has been submitted to [employer] for a QUANTITY LIMIT EXCEPTION and is pending determination.   Key: Gregg Young

## 2022-05-08 ENCOUNTER — Encounter: Payer: Self-pay | Admitting: Neurology

## 2022-05-09 ENCOUNTER — Other Ambulatory Visit: Payer: Self-pay | Admitting: Neurology

## 2022-05-09 MED ORDER — XCOPRI 50 MG PO TABS: ORAL_TABLET | ORAL | 5 refills | Status: DC

## 2022-05-10 NOTE — Telephone Encounter (Signed)
Patient Advocate Encounter  Prior Authorization for Pregabalin 225MG  capsules has been approved.    PA# [id number] Effective dates: 05/06/2022 through 04/04/2023      Lyndel Safe, [place] Patient Advocate Specialist [place] Patient Advocate Team Direct Number: [phone]  Fax: [phone]

## 2022-05-26 ENCOUNTER — Telehealth: Payer: Self-pay

## 2022-05-26 NOTE — Telephone Encounter (Signed)
Called patient to schedule Medicare Annual Wellness Visit (AWV). Left message for patient to call back and schedule Medicare Annual Wellness Visit (AWV).  Last date of AWV: NO HX of AWV  Please schedule an appointment at any time with [facility].  Norton Blizzard, [place] ([facility])  [facility] [facility] [phone]

## 2022-05-30 ENCOUNTER — Telehealth: Payer: Self-pay

## 2022-05-30 NOTE — Telephone Encounter (Signed)
Contacted Gregg Young to schedule their annual wellness visit. Appointment made for 06/07/22.  Gregg Young, [name] ([facility])  [facility] [phone]

## 2022-06-07 ENCOUNTER — Ambulatory Visit: Payer: 59

## 2022-06-11 ENCOUNTER — Encounter (HOSPITAL_COMMUNITY): Payer: Self-pay

## 2022-06-11 ENCOUNTER — Other Ambulatory Visit (HOSPITAL_BASED_OUTPATIENT_CLINIC_OR_DEPARTMENT_OTHER): Payer: Self-pay

## 2022-06-11 ENCOUNTER — Ambulatory Visit (HOSPITAL_COMMUNITY)
Admission: EM | Admit: 2022-06-11 | Discharge: 2022-06-11 | Disposition: A | Discharge status: Home / Self Care | Payer: 59 | Attending: Family Medicine | Admitting: Family Medicine

## 2022-06-11 DIAGNOSIS — G40219 Localization-related (focal) (partial) symptomatic epilepsy and epileptic syndromes with complex partial seizures, intractable, without status epilepticus: Secondary | ICD-10-CM | POA: Diagnosis not present

## 2022-06-11 MED ORDER — LEVETIRACETAM 1000 MG PO TABS: 2000.0000 mg | ORAL_TABLET | Freq: Two times a day (BID) | ORAL | 0 refills | Status: DC

## 2022-06-11 NOTE — ED Triage Notes (Signed)
Pt is here for med refill on his [place]. Last dose was this morning.

## 2022-06-12 ENCOUNTER — Encounter: Payer: Self-pay | Admitting: Neurology

## 2022-06-13 ENCOUNTER — Encounter: Payer: Self-pay | Admitting: Neurology

## 2022-06-13 ENCOUNTER — Telehealth: Payer: Self-pay

## 2022-06-13 NOTE — ED Provider Notes (Signed)
[place]   [id number] 06/11/22 Arrival Time: [id number]  ASSESSMENT & PLAN:  1. 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    Dispense:  12 tablet    Refill:  0   Caregiver is in touch with neurologist. They will see him in the near future and provide further refills.   Follow-up Information     Marin Olp, MD.   Specialty: Family Medicine Why: As needed. Contact information: [place] [address] [phone]                 Reviewed expectations re: course of current medical issues. Questions answered. Outlined signs and symptoms indicating need for more acute intervention. Understanding verbalized. After Visit Summary given.   SUBJECTIVE: History from: Patient and Caregiver. Gregg Young is a 47 y.o. male. Pt is here for med refill on his [place]. Last dose was this morning.  No recent seizure activity. Normal PO intake without n/v/d.  OBJECTIVE:  Vitals:   06/11/22 1637 06/11/22 1638  BP: 120/81   Pulse: 72   Resp:  18  Temp: 98.7 F (37.1 C)   TempSrc: Oral   SpO2: 100%     General appearance: alert; no distress Eyes: PERRLA; EOMI; conjunctiva normal Skin: warm and dry Neurologic: normal gait Psychological: alert and cooperative; normal mood and affect   Allergies  Allergen Reactions   Penicillins Rash    Has patient had a PCN reaction causing immediate rash, facial/tongue/throat swelling, SOB or lightheadedness with hypotension: No Has patient had a PCN reaction causing severe rash involving mucus membranes or skin necrosis: No Has patient had a PCN reaction that required hospitalization No Has patient had a PCN reaction occurring within the last 10 years: No If all of the above answers are "NO", then may proceed  with Cephalosporin use.     Past Medical History:  Diagnosis Date   History of kidney stones    Seizures ([place])    daily   Social History   Socioeconomic History   Marital status: Single    Spouse name: Not on file   Number of children: 0   Years of education: HS   Highest education level: Not on file  Occupational History   Not on file  Tobacco Use   Smoking status: Former    Packs/day: 0.50    Years: 4.00    Total pack years: 2.00    Types: Cigarettes    Quit date: 03/03/1995    Years since quitting: 27.2   Smokeless tobacco: Never  Vaping Use   Vaping Use: Never used  Substance and Sexual Activity   Alcohol use: No    Alcohol/week: 0.0 standard drinks of alcohol   Drug use: No   Sexual activity: Not on file  Other Topics Concern   Not on file  Social History Narrative   Patient is single and lives by his self  Single story just steps to get on front porch dose not use front steps.   Patient has a high school education.   Patient is right-handed.   Patient does not have any children.   Patient is on disability.   Patient drinks three sodas daily.   Social Determinants of Health   Financial Resource Strain: Not on file  Food Insecurity: No Food Insecurity (01/03/2022)   Hunger Vital Sign    Worried About Running Out of Food in the Last Year: Never true    Ran Out of Food in the Last Year: Never true  Transportation Needs: No Transportation Needs (01/03/2022)   PRAPARE - Hydrologist (Medical): No    Lack of Transportation (Non-Medical): No  Physical Activity: Not on file  Stress: Not on file  Social Connections: Not on file  Intimate Partner Violence: Not on file   Family History  Problem Relation Age of Onset   Healthy Mother    Healthy Father    Past Surgical History:  Procedure Laterality Date   EYE SURGERY     HERNIA REPAIR     baby   stimulator battery replacement  01/2020   '@[facility]    VAGUS NERVE STIMULATOR  INSERTION Left 03/10/2015   Procedure: LEFT SIDED PLACEMENT VAGAL NERVE STIMULATOR IMPLANT;  Surgeon: Consuella Lose, MD;  Location: [facility];  Service: Neurosurgery;  Laterality: Left;     Vanessa Kick, MD 06/13/22 1337

## 2022-06-13 NOTE — Telephone Encounter (Signed)
Pt mother called informed that we always have a provider on call after hours and if the on call nurse tells her we do not tell her you know for a fact we do and for her to page the on call provider,. Pt mother verbalized understanding and was thankful for the call back. She was still upset she had to go to urgent care to get a medication refill.

## 2022-06-19 ENCOUNTER — Encounter: Payer: Self-pay | Admitting: Neurology

## 2022-06-20 ENCOUNTER — Telehealth: Payer: 59 | Admitting: Physician Assistant

## 2022-06-20 DIAGNOSIS — B9689 Other specified bacterial agents as the cause of diseases classified elsewhere: Secondary | ICD-10-CM | POA: Diagnosis not present

## 2022-06-20 DIAGNOSIS — J208 Acute bronchitis due to other specified organisms: Secondary | ICD-10-CM | POA: Diagnosis not present

## 2022-06-20 MED ORDER — DOXYCYCLINE HYCLATE 100 MG PO TABS: 100.0000 mg | ORAL_TABLET | Freq: Two times a day (BID) | ORAL | 0 refills | Status: DC

## 2022-06-20 MED ORDER — PREDNISONE 20 MG PO TABS: 40.0000 mg | ORAL_TABLET | Freq: Every day | ORAL | 0 refills | Status: DC

## 2022-06-20 NOTE — Progress Notes (Signed)
Virtual Visit Consent   Gregg Young, you are scheduled for a virtual visit with a [facility] provider today. Just as with appointments in the office, your consent must be obtained to participate. Your consent will be active for this visit and any virtual visit you may have with one of our providers in the next 365 days. If you have a MyChart account, a copy of this consent can be sent to you electronically.  As this is a virtual visit, video technology does not allow for your provider to perform a traditional examination. This may limit your provider's ability to fully assess your condition. If your provider identifies any concerns that need to be evaluated in person or the need to arrange testing (such as labs, EKG, etc.), we will make arrangements to do so. Although advances in technology are sophisticated, we cannot ensure that it will always work on either your end or our end. If the connection with a video visit is poor, the visit may have to be switched to a telephone visit. With either a video or telephone visit, we are not always able to ensure that we have a secure connection.  By engaging in this virtual visit, you consent to the provision of healthcare and authorize for your insurance to be billed (if applicable) for the services provided during this visit. Depending on your insurance coverage, you may receive a charge related to this service.  I need to obtain your verbal consent now. Are you willing to proceed with your visit today? Gregg Young has provided verbal consent on 06/20/2022 for a virtual visit (video or telephone). Mar Daring, PA-C  Date: 06/20/2022 3:36 PM  Virtual Visit via Video Note   I, Mar Daring, connected with  Gregg Young  ([id number], 12-May-1975) on 06/20/22 at  3:30 PM EDT by a video-enabled telemedicine application and verified that I am speaking with the correct person using two identifiers.  Location: Patient: Virtual Visit Location  Patient: Home Provider: Virtual Visit Location Provider: Home Office   I discussed the limitations of evaluation and management by telemedicine and the availability of in person appointments. The patient expressed understanding and agreed to proceed.    History of Present Illness: Gregg Young is a 47 y.o. who identifies as a male who was assigned male at birth, and is being seen today for cough and congestion.  HPI: Cough This is a new problem. The current episode started in the past 7 days. The problem has been gradually worsening. The problem occurs constantly. The cough is Productive of sputum and productive of purulent sputum. Associated symptoms include headaches, myalgias, nasal congestion, postnasal drip, rhinorrhea and a sore throat. Pertinent negatives include no chills, ear congestion, ear pain, shortness of breath or wheezing. The symptoms are aggravated by lying down. Treatments tried: mucinex, cough drops. The treatment provided no relief.     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    Medications:  Current  Outpatient Medications:    doxycycline (VIBRA-TABS) 100 MG tablet, Take 1 tablet (100 mg total) by mouth 2 (two) times daily., Disp: 20 tablet, Rfl: 0   predniSONE (DELTASONE) 20 MG tablet, Take 2 tablets (40 mg total) by mouth daily with breakfast., Disp: 10 tablet, Rfl: 0   cannabidiol (EPIDIOLEX) 100 MG/ML solution, Take 5 mL twice a day, Disp: 300 mL, Rfl: 5   Cenobamate (XCOPRI) 50 MG TABS, Take 1 tablet every night, Disp: 30 tablet, Rfl: 5   cloBAZam (ONFI) 10 MG tablet, TAKE 1 TABLET BY MOUTH AT BEDTIME, Disp: 90 tablet, Rfl: 3   escitalopram (LEXAPRO) 10 MG tablet, TAKE 1 TABLET BY MOUTH EVERY DAY, Disp: 30 tablet, Rfl: 3   Lacosamide 150 MG TABS, Take 1 tablet twice a day, Disp: 180 tablet, Rfl: 3   lamoTRIgine (LAMICTAL) 100 MG tablet, TAKE 2 TABLETS BY MOUTH 3 TIMES DAILY, Disp: 180 tablet, Rfl: 11   levETIRAcetam (KEPPRA) 1000 MG tablet, Take 2 tablets (2,000 mg total) by mouth 2 (two) times daily., Disp: 12 tablet, Rfl: 0   levOCARNitine (CARNITOR) 1 GM/10ML solution, Take 3.21mL twice a day, Disp: 118 mL, Rfl: 12   pregabalin (LYRICA) 225 MG capsule, TAKE 1 CAPSULE BY MOUTH 3 TIMES DAILY, Disp: 270 capsule, Rfl: 3  Observations/Objective: Patient is well-developed, well-nourished in no acute distress.  Resting comfortably at home.  Head is normocephalic, atraumatic.  No labored breathing.  Speech is clear and coherent with logical content.  Patient is alert and oriented at baseline.    Assessment and Plan: 1. Acute bacterial bronchitis - doxycycline (VIBRA-TABS) 100 MG tablet; Take 1 tablet (100 mg total) by mouth 2 (two) times daily.  Dispense: 20 tablet; Refill: 0 - predniSONE (DELTASONE) 20 MG tablet; Take 2 tablets (40 mg total) by mouth daily with breakfast.  Dispense: 10 tablet; Refill: 0  - Worsening over a week despite OTC medications - Will treat with Doxycycline and Prednisone - Can continue Mucinex  - Push fluids.  - Rest.  - Steam and humidifier can help -  Seek in person evaluation if worsening or symptoms fail to improve    Follow Up Instructions: I discussed the assessment and treatment plan with the patient. The patient was provided an opportunity to ask questions and all were answered. The patient agreed with the plan and demonstrated an understanding of the instructions.  A copy of instructions were sent to the patient via MyChart unless otherwise noted below.    The patient was advised to call back or seek an in-person evaluation if the symptoms worsen or if the condition fails to improve as anticipated.  Time:  I spent 10 minutes with the patient via telehealth technology discussing the above problems/concerns.    Mar Daring, PA-C

## 2022-06-20 NOTE — Patient Instructions (Signed)
Gregg Young, thank you for joining Mar Daring, PA-C for today's virtual visit.  While this provider is not your primary care provider (PCP), if your PCP is located in our provider database this encounter information will be shared with them immediately following your visit.   [facility] account gives you access to today's visit and all your visits, tests, and labs performed at [facility] " click here if you don't have a [facility] account or go to [email or web address]  Consent: (Patient) Gregg Young provided verbal consent for this virtual visit at the beginning of the encounter.  Current Medications:  Current Outpatient Medications:    doxycycline (VIBRA-TABS) 100 MG tablet, Take 1 tablet (100 mg total) by mouth 2 (two) times daily., Disp: 20 tablet, Rfl: 0   predniSONE (DELTASONE) 20 MG tablet, Take 2 tablets (40 mg total) by mouth daily with breakfast., Disp: 10 tablet, Rfl: 0   cannabidiol (EPIDIOLEX) 100 MG/ML solution, Take 5 mL twice a day, Disp: 300 mL, Rfl: 5   Cenobamate (XCOPRI) 50 MG TABS, Take 1 tablet every night, Disp: 30 tablet, Rfl: 5   cloBAZam (ONFI) 10 MG tablet, TAKE 1 TABLET BY MOUTH AT BEDTIME, Disp: 90 tablet, Rfl: 3   escitalopram (LEXAPRO) 10 MG tablet, TAKE 1 TABLET BY MOUTH EVERY DAY, Disp: 30 tablet, Rfl: 3   Lacosamide 150 MG TABS, Take 1 tablet twice a day, Disp: 180 tablet, Rfl: 3   lamoTRIgine (LAMICTAL) 100 MG tablet, TAKE 2 TABLETS BY MOUTH 3 TIMES DAILY, Disp: 180 tablet, Rfl: 11   levETIRAcetam (KEPPRA) 1000 MG tablet, Take 2 tablets (2,000 mg total) by mouth 2 (two) times daily., Disp: 12 tablet, Rfl: 0   levOCARNitine (CARNITOR) 1 GM/10ML solution, Take 3.61mL twice a day, Disp: 118 mL, Rfl: 12   pregabalin (LYRICA) 225 MG capsule, TAKE 1 CAPSULE BY MOUTH 3 TIMES DAILY, Disp: 270 capsule, Rfl: 3   Medications ordered in this encounter:  Meds ordered this encounter  Medications   doxycycline  (VIBRA-TABS) 100 MG tablet    Sig: Take 1 tablet (100 mg total) by mouth 2 (two) times daily.    Dispense:  20 tablet    Refill:  0    Order Specific Question:   Supervising Provider    Answer:   Chase Picket [id number]   predniSONE (DELTASONE) 20 MG tablet    Sig: Take 2 tablets (40 mg total) by mouth daily with breakfast.    Dispense:  10 tablet    Refill:  0    Order Specific Question:   Supervising Provider    Answer:   Chase Picket [id number]     *If you need refills on other medications prior to your next appointment, please contact your pharmacy*  Follow-Up: Call back or seek an in-person evaluation if the symptoms worsen or if the condition fails to improve as anticipated.  [facility] [phone]  Other Instructions  Acute Bronchitis, Adult  Acute bronchitis is sudden inflammation of the main airways (bronchi) that come off the windpipe (trachea) in the lungs. The swelling causes the airways to get smaller and make more mucus than normal. This can make it hard to breathe and can cause coughing or noisy breathing (wheezing). Acute bronchitis may last several weeks. The cough may last longer. Allergies, asthma, and exposure to smoke may make the condition worse. What are the causes? This condition can be caused by germs and by substances that  irritate the lungs, including: Cold and flu viruses. The most common cause of this condition is the virus that causes the common cold. Bacteria. This is less common. Breathing in substances that irritate the lungs, including: Smoke from cigarettes and other forms of tobacco. Dust and pollen. Fumes from household cleaning products, gases, or burned fuel. Indoor or outdoor air pollution. What increases the risk? The following factors may make you more likely to develop this condition: A weak body's defense system, also called the immune system. A condition that affects your lungs and breathing, such as  asthma. What are the signs or symptoms? Common symptoms of this condition include: Coughing. This may bring up clear, yellow, or green mucus from your lungs (sputum). Wheezing. Runny or stuffy nose. Having too much mucus in your lungs (chest congestion). Shortness of breath. Aches and pains, including sore throat or chest. How is this diagnosed? This condition is usually diagnosed based on: Your symptoms and medical history. A physical exam. You may also have other tests, including tests to rule out other conditions, such as pneumonia. These tests include: A test of lung function. Test of a mucus sample to look for the presence of bacteria. Tests to check the oxygen level in your blood. Blood tests. Chest X-ray. How is this treated? Most cases of acute bronchitis clear up over time without treatment. Your health care provider may recommend: Drinking more fluids to help thin your mucus so it is easier to cough up. Taking inhaled medicine (inhaler) to improve air flow in and out of your lungs. Using a vaporizer or a humidifier. These are machines that add water to the air to help you breathe better. Taking a medicine that thins mucus and clears congestion (expectorant). Taking a medicine that prevents or stops coughing (cough suppressant). It is not common to take an antibiotic medicine for this condition. Follow these instructions at home:  Take over-the-counter and prescription medicines only as told by your health care provider. Use an inhaler, vaporizer, or humidifier as told by your health care provider. Take two teaspoons (10 mL) of honey at bedtime to lessen coughing at night. Drink enough fluid to keep your urine pale yellow. Do not use any products that contain nicotine or tobacco. These products include cigarettes, chewing tobacco, and vaping devices, such as e-cigarettes. If you need help quitting, ask your health care provider. Get plenty of rest. Return to your normal  activities as told by your health care provider. Ask your health care provider what activities are safe for you. Keep all follow-up visits. This is important. How is this prevented? To lower your risk of getting this condition again: Wash your hands often with soap and water for at least 20 seconds. If soap and water are not available, use hand sanitizer. Avoid contact with people who have cold symptoms. Try not to touch your mouth, nose, or eyes with your hands. Avoid breathing in smoke or chemical fumes. Breathing smoke or chemical fumes will make your condition worse. Get the flu shot every year. Contact a health care provider if: Your symptoms do not improve after 2 weeks. You have trouble coughing up the mucus. Your cough keeps you awake at night. You have a fever. Get help right away if you: Cough up blood. Feel pain in your chest. Have severe shortness of breath. Faint or keep feeling like you are going to faint. Have a severe headache. Have a fever or chills that get worse. These symptoms may represent a serious  problem that is an emergency. Do not wait to see if the symptoms will go away. Get medical help right away. Call your local emergency services (911 in the [place] Do not drive yourself to the hospital. Summary Acute bronchitis is inflammation of the main airways (bronchi) that come off the windpipe (trachea) in the lungs. The swelling causes the airways to get smaller and make more mucus than normal. Drinking more fluids can help thin your mucus so it is easier to cough up. Take over-the-counter and prescription medicines only as told by your health care provider. Do not use any products that contain nicotine or tobacco. These products include cigarettes, chewing tobacco, and vaping devices, such as e-cigarettes. If you need help quitting, ask your health care provider. Contact a health care provider if your symptoms do not improve after 2 weeks. This information is not  intended to replace advice given to you by your health care provider. Make sure you discuss any questions you have with your health care provider. Document Revised: 07/01/2021 Document Reviewed: 07/22/2020 Elsevier Patient Education  [place].    If you have been instructed to have an in-person evaluation today at a local Urgent Care facility, please use the link below. It will take you to a list of all of our available [facility], including address, phone number and hours of operation. Please do not delay care.  [facility]  If you or a family member do not have a primary care provider, use the link below to schedule a visit and establish care. When you choose a [facility] primary care physician or advanced practice provider, you gain a long-term partner in health. Find a Primary Care Provider  Learn more about 's in-office and virtual care options: [facility]

## 2022-07-08 ENCOUNTER — Other Ambulatory Visit: Payer: Self-pay

## 2022-07-08 MED ORDER — LEVETIRACETAM 1000 MG PO TABS: 2000.0000 mg | ORAL_TABLET | Freq: Two times a day (BID) | ORAL | 1 refills | Status: DC

## 2022-07-11 ENCOUNTER — Encounter: Payer: Self-pay | Admitting: Neurology

## 2022-07-11 ENCOUNTER — Telehealth: Payer: Self-pay

## 2022-07-11 NOTE — Addendum Note (Signed)
Addended by: Dimas Chyle on: 07/11/2022 03:40 PM   Modules accepted: Orders

## 2022-07-11 NOTE — Telephone Encounter (Signed)
Pt mother called informed No need to take levocarnitine, and that we had not sent in the RX for it the pharmacy had filled an old RX that was their mistake,

## 2022-07-11 NOTE — Telephone Encounter (Signed)
Patients mom called in stating the pharmacy gave them the prescription, levOCARNitine (CARNITOR) 1 GM/10ML solution. Wondering why it was sent in to the pharmacy if he hasn't taken it in a long time. States she picked up the KEPPRA last week after we sent that in.

## 2022-07-11 NOTE — Telephone Encounter (Signed)
No need to take levocarnitine, thanks

## 2022-08-04 ENCOUNTER — Ambulatory Visit (INDEPENDENT_AMBULATORY_CARE_PROVIDER_SITE_OTHER): Payer: 59 | Admitting: Family Medicine

## 2022-08-04 ENCOUNTER — Encounter: Payer: Self-pay | Admitting: Family Medicine

## 2022-08-04 ENCOUNTER — Encounter: Payer: Self-pay | Admitting: Neurology

## 2022-08-04 VITALS — BP 118/70 | HR 76 | Temp 98.1°F | Wt 251.4 lb

## 2022-08-04 DIAGNOSIS — Z13220 Encounter for screening for lipoid disorders: Secondary | ICD-10-CM

## 2022-08-04 DIAGNOSIS — Z1159 Encounter for screening for other viral diseases: Secondary | ICD-10-CM | POA: Diagnosis not present

## 2022-08-04 DIAGNOSIS — Z114 Encounter for screening for human immunodeficiency virus [HIV]: Secondary | ICD-10-CM

## 2022-08-04 DIAGNOSIS — G40219 Localization-related (focal) (partial) symptomatic epilepsy and epileptic syndromes with complex partial seizures, intractable, without status epilepticus: Secondary | ICD-10-CM

## 2022-08-04 DIAGNOSIS — Z1211 Encounter for screening for malignant neoplasm of colon: Secondary | ICD-10-CM | POA: Diagnosis not present

## 2022-08-04 DIAGNOSIS — Z0000 Encounter for general adult medical examination without abnormal findings: Secondary | ICD-10-CM

## 2022-08-04 DIAGNOSIS — Z131 Encounter for screening for diabetes mellitus: Secondary | ICD-10-CM

## 2022-08-04 DIAGNOSIS — N5089 Other specified disorders of the male genital organs: Secondary | ICD-10-CM

## 2022-08-04 DIAGNOSIS — Z23 Encounter for immunization: Secondary | ICD-10-CM | POA: Diagnosis not present

## 2022-08-04 NOTE — Progress Notes (Signed)
Phone: [phone]   Subjective:  Patient presents today for their annual physical. Chief complaint-noted.   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) 09/14/2017    Priority: High   Obstructive sleep apnea 05/08/2017    Priority: Medium    Excessive daytime sleepiness 02/28/2017   Dizziness and giddiness 07/08/2014   Focal epilepsy with impairment of consciousness, 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/15/2014   Partial symptomatic epilepsy (HCC) 07/13/2012   Past Surgical History:  Procedure Laterality Date   EYE SURGERY     HERNIA REPAIR     baby   stimulator battery replacement  01/2020   @[facility]    VAGUS NERVE STIMULATOR INSERTION Left 03/10/2015   Procedure: LEFT SIDED PLACEMENT VAGAL NERVE STIMULATOR IMPLANT;  Surgeon: Lisbeth Renshaw, MD;  Location: [facility];  Service: Neurosurgery;  Laterality: Left;    Family History  Problem Relation Age of Onset   Healthy Mother    Healthy Father     Medications- reviewed and updated Current Outpatient Medications  Medication Sig Dispense Refill   cannabidiol (EPIDIOLEX) 100 MG/ML solution Take 5 mL twice a day 300 mL 5   Cenobamate (XCOPRI) 50 MG TABS Take 1 tablet every night 30 tablet 5   cloBAZam (ONFI) 10 MG tablet TAKE 1 TABLET BY MOUTH AT BEDTIME 90 tablet 3   escitalopram (LEXAPRO) 10 MG tablet TAKE 1 TABLET BY MOUTH EVERY DAY 30 tablet 3   Lacosamide 150 MG TABS Take 1 tablet twice a day 180 tablet 3   lamoTRIgine (LAMICTAL) 100 MG tablet TAKE 2 TABLETS BY MOUTH 3 TIMES DAILY 180 tablet 11   levETIRAcetam (KEPPRA) 1000 MG tablet Take 2 tablets (2,000  mg total) by mouth 2 (two) times daily. 360 tablet 1   pregabalin (LYRICA) 225 MG capsule TAKE 1 CAPSULE BY MOUTH 3 TIMES DAILY 270 capsule 3   No current facility-administered medications for this visit.    Allergies-reviewed and updated Allergies  Allergen Reactions   Penicillins Rash    Has patient had a PCN reaction causing immediate rash, facial/tongue/throat swelling, SOB or lightheadedness with hypotension: No Has patient had a PCN reaction causing severe rash involving mucus membranes or skin necrosis: No Has patient had a PCN reaction that required hospitalization No Has patient had a PCN reaction occurring within the last 10 years: No If all of the above answers are "NO", then may proceed with Cephalosporin use.     Social History   Social History Narrative   Patient is single and lives by his self  Single story just steps to get on front porch dose not use front steps.   Patient has a high school education.   Patient is right-handed.   Patient does not have any children.   Patient is on disability.   Patient drinks three sodas daily.   Objective  Objective:  BP 118/70   Pulse 76   Temp 98.1 F (36.7 C)   Wt 251 lb 6.4 oz (114 kg)   SpO2 93%   BMI 40.58 kg/m  Gen: NAD, resting comfortably HEENT: Mucous membranes are moist. Oropharynx normal Neck: no thyromegaly  CV: RRR no murmurs rubs or gallops Lungs: CTAB no crackles, wheeze, rhonchi Abdomen: soft/nontender/nondistended/normal bowel sounds. No rebound or guarding.  Ext: no edema Skin: warm, dry Neuro: grossly normal, moves all extremities, PERRLA GU: left testicle normal- right testicle per patient slightly larger- I cannot tell large difference- right testicle perhaps small nodule on posterior portion but only a few mm   Assessment and Plan  47 y.o. male presenting for annual physical.  Health Maintenance counseling: 1. Anticipatory guidance: Patient counseled regarding regular dental exams -q6 months,  eye exams -yearly,  avoiding smoking and second hand smoke , limiting alcohol to 2 beverages per day - doesn't drink, no illicit drugs .   2. Risk factor reduction:  Advised patient of need for regular exercise and diet rich and fruits and vegetables to reduce risk of heart attack and stroke.  Exercise- rides stationary bike every other day 30 minutes- encouraged to increase.  Diet/weight management-feels could cut down on unhealthy foods likes chips.  Wt Readings from Last 3 Encounters:  08/04/22 251 lb 6.4 oz (114 kg)  04/18/22 252 lb (114.3 kg)  04/15/22 250 lb (113.4 kg)  3. Immunizations/screenings/ancillary studies- was given shingrix after he requested immunization- staff did not realize he was on medicare at his age and banner visible portion saying only [facility]- asked him to let us know about bill before we give again as may be part D exclusion in office. Also not indicated under 50- was given before I saw patient- advised against repeat until age 33 Immunization History  Administered Date(s) Administered   Influenza Split 01/17/2011, 01/24/2012   Influenza Whole 01/24/2007, 01/09/2008, 01/14/2009, 12/24/2009   Influenza,inj,Quad PF,6+ Mos 01/16/2013, 01/15/2014, 01/26/2017, 01/31/2018, 12/31/2019, 01/12/2021   Influenza-Unspecified 01/13/2015, 02/13/2018, 01/03/2019   PFIZER Comirnaty(Gray Top)Covid-19 Tri-Sucrose Vaccine 12/29/2021   PFIZER(Purple Top)SARS-COV-2 Vaccination 06/07/2019, 07/22/2019, 03/03/2020   Pfizer Covid-19 Vaccine Bivalent Booster 63yrs & up 01/12/2021   Tdap 08/25/2017, 04/10/2022   Zoster Recombinat (Shingrix) 08/04/2022  4. Prostate cancer screening- no family history, start at age 10   5. Colon cancer screening - refer to gastroenterology for colonoscopy 6. Skin cancer screening- sees dermatology. advised regular sunscreen use. Denies worrisome, changing, or new skin lesions.  7. Smoking associated screening (lung cancer screening, AAA screen 65-75, UA)- quit  1996 2 pack years- no regular screenings until 65 aaa 8. STD screening - single - opts out std screening  Status of chronic or acute concerns   #epilepsy- follow up closely with neurology- currently on 7 medications  #Right testicular enlargement- and notes a lump in it- since he has noted enlargement nad lump (even though hard for me to detect) we will get ultrasound  #morbid obesity- asks about Ozempic- after discussion on potential cost wants to hold for now- not clear if insurance owuld cover and even if covered could be close to 200 a month- he can call insurance and check directly- does not carry diabetes diagnosis (though I will screen today)   #screening hyperlipidemia S: Medication:none  No results found for: "CHOL", "HDL", "LDLCALC", "LDLDIRECT", "TRIG", "CHOLHDL" A/P: update lipid panel address assess arteriosclerotic cardiovascular disease risk  Recommended follow up: Return in about 1 year (around 08/04/2023) for physical or sooner if needed.Schedule b4 you leave.  Lab/Order associations: fasting- usually only eats dinner   ICD-10-CM   1. Preventative health care  Z00.00 HIV antibody (with reflex)    Hepatitis C Antibody    Comprehensive metabolic panel    CBC with Differential/Platelet  Lipid panel    Hemoglobin A1c    2. Need for shingles vaccine  Z23 Zoster Recombinant (Shingrix )    3. Morbid obesity (HCC)  E66.01 Hemoglobin A1c    4. Screening for hyperlipidemia  Z13.220 Lipid panel    5. Screening for diabetes mellitus  Z13.1 Hemoglobin A1c    6. 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 Encounter for hepatitis C screening test for low risk patient  Z11.59 Hepatitis C Antibody    8. Screening for HIV (human immunodeficiency virus)  Z11.4 HIV antibody (with reflex)    9. Screen for colon cancer  Z12.11 Ambulatory referral to Gastroenterology     10. Swelling of right testicle  N50.89 US SCROTUM W/DOPPLER      No orders of the defined types were placed in this encounter.   Return precautions advised.  Tana Conch, MD

## 2022-08-04 NOTE — Patient Instructions (Addendum)
First shingrix given today per your request  (not indicated until after 50) and also  did not realize had medicare- chance not covered in office- please call us if get big bill and get your 2nd shot at pharmacy after age 48 as that is formal recommendation  You are eligible to schedule your annual wellness visit with our nurse specialist Inetta Fermo.  Please consider scheduling this before you leave today  [facility] contact Please call to schedule visit and/or procedure Address: [address] [place], [place], [place] [address] Phone: [phone]   #he asks about Ozempic- after discussion on potential cost wants to hold for now- not clear if insurance would cover and even if covered could be close to 200 a month per projections- he can call insurance and check directly- does not carry diabetes diagnosis (though I will screen today)   We will call you within two weeks about your referral for testicular ultrasound through [facility].  Their phone number is [phone].  Please call them if you have not heard in 1-2 weeks  Please stop by lab before you go If you have mychart- we will send your results within 3 business days of [place] receiving them.  If you do not have mychart- we will call you about results within 5 business days of [place] receiving them.  *please also note that you will see labs on mychart as soon as they post. I will later go in and write notes on them- will say "notes from Dr. Durene Cal"

## 2022-08-05 LAB — CBC WITH DIFFERENTIAL/PLATELET
Basophils Absolute: 0 10*3/uL (ref 0.0–0.1)
Basophils Relative: 0.8 % (ref 0.0–3.0)
Eosinophils Absolute: 0.4 10*3/uL (ref 0.0–0.7)
Eosinophils Relative: 6.9 % — ABNORMAL HIGH (ref 0.0–5.0)
HCT: 41 % (ref 39.0–52.0)
Hemoglobin: 14.3 g/dL (ref 13.0–17.0)
Lymphocytes Relative: 22.4 % (ref 12.0–46.0)
Lymphs Abs: 1.3 10*3/uL (ref 0.7–4.0)
MCHC: 34.8 g/dL (ref 30.0–36.0)
MCV: 95.5 fl (ref 78.0–100.0)
Monocytes Absolute: 0.4 10*3/uL (ref 0.1–1.0)
Monocytes Relative: 7.7 % (ref 3.0–12.0)
Neutro Abs: 3.5 10*3/uL (ref 1.4–7.7)
Neutrophils Relative %: 62.2 % (ref 43.0–77.0)
Platelets: 323 10*3/uL (ref 150.0–400.0)
RBC: 4.3 Mil/uL (ref 4.22–5.81)
RDW: 13.2 % (ref 11.5–15.5)
WBC: 5.7 10*3/uL (ref 4.0–10.5)

## 2022-08-05 LAB — HEPATITIS C ANTIBODY: Hepatitis C Ab: NONREACTIVE

## 2022-08-05 LAB — COMPREHENSIVE METABOLIC PANEL
ALT: 18 U/L (ref 0–53)
AST: 15 U/L (ref 0–37)
Albumin: 4.5 g/dL (ref 3.5–5.2)
Alkaline Phosphatase: 85 U/L (ref 39–117)
BUN: 11 mg/dL (ref 6–23)
CO2: 28 mEq/L (ref 19–32)
Calcium: 9.4 mg/dL (ref 8.4–10.5)
Chloride: 103 mEq/L (ref 96–112)
Creatinine, Ser: 0.82 mg/dL (ref 0.40–1.50)
GFR: 104.16 mL/min (ref >60.00)
Glucose, Bld: 78 mg/dL (ref 70–99)
Potassium: 4.3 mEq/L (ref 3.5–5.1)
Sodium: 139 mEq/L (ref 135–145)
Total Bilirubin: 0.5 mg/dL (ref 0.2–1.2)
Total Protein: 7.3 g/dL (ref 6.0–8.3)

## 2022-08-05 LAB — LIPID PANEL
Cholesterol: 191 mg/dL (ref 0–200)
HDL: 50.8 mg/dL (ref >39.00)
LDL Cholesterol: 117 mg/dL — ABNORMAL HIGH (ref 0–99)
NonHDL: 139.84
Total CHOL/HDL Ratio: 4
Triglycerides: 112 mg/dL (ref 0.0–149.0)
VLDL: 22.4 mg/dL (ref 0.0–40.0)

## 2022-08-05 LAB — HEMOGLOBIN A1C: Hgb A1c MFr Bld: 5.2 % (ref 4.6–6.5)

## 2022-08-05 LAB — HIV ANTIBODY (ROUTINE TESTING W REFLEX): HIV 1&2 Ab, 4th Generation: NONREACTIVE

## 2022-08-09 ENCOUNTER — Encounter: Payer: Self-pay | Admitting: Neurology

## 2022-08-09 ENCOUNTER — Ambulatory Visit (INDEPENDENT_AMBULATORY_CARE_PROVIDER_SITE_OTHER): Payer: 59 | Admitting: Neurology

## 2022-08-09 VITALS — BP 125/74 | HR 84 | Ht 66.0 in | Wt 254.4 lb

## 2022-08-09 DIAGNOSIS — G40211 Localization-related (focal) (partial) symptomatic epilepsy and epileptic syndromes with complex partial seizures, intractable, with status epilepticus: Secondary | ICD-10-CM | POA: Diagnosis not present

## 2022-08-09 MED ORDER — CENOBAMATE 100 MG PO TABS: ORAL_TABLET | ORAL | 5 refills | Status: DC

## 2022-08-09 NOTE — Patient Instructions (Signed)
Always good to see you.  Increase Xcopri to 100mg  daily. With your current bottle of Xcopri 50mg : take 2 tablets daily. Once finished, your new bottle will be Xcopri 100mg : take 1 tablet daily  2. Start weaning down Epidiolex: reduce to 2.70mL twice a day for 1 week, then reduce to 2.97mL daily for 1 week, then stop  3. Continue all your other medications  4. Monitor for increase in seizures or decreased alertness as VNS battery goes down/off  5. Follow-up in 4 months, call for any changes   Seizure Precautions: 1. If medication has been prescribed for you to prevent seizures, take it exactly as directed.  Do not stop taking the medicine without talking to your doctor first, even if you have not had a seizure in a long time.   2. Avoid activities in which a seizure would cause danger to yourself or to others.  Don't operate dangerous machinery, swim alone, or climb in high or dangerous places, such as on ladders, roofs, or girders.  Do not drive unless your doctor says you may.  3. If you have any warning that you may have a seizure, lay down in a safe place where you can't hurt yourself.    4.  No driving for 6 months from last seizure, as per [facility]   Please refer to the following link on the Epilepsy Foundation of America's website for more information: http://www.epilepsyfoundation.org/answerplace/Social/driving/drivingu.cfm   5.  Maintain good sleep hygiene.  6.  Contact your doctor if you have any problems that may be related to the medicine you are taking.  7.  Call 911 and bring the patient back to the ED if:        A.  The seizure lasts longer than 5 minutes.       B.  The patient doesn't awaken shortly after the seizure  C.  The patient has new problems such as difficulty seeing, speaking or moving  D.  The patient was injured during the seizure  E.  The patient has a temperature over 102 F (39C)  F.  The patient vomited and now is having trouble  breathing

## 2022-08-09 NOTE — Progress Notes (Signed)
NEUROLOGY FOLLOW UP OFFICE NOTE  DORLAND WOLBERS [id number] [birth year]  HISTORY OF PRESENT ILLNESS: I had the pleasure of seeing Aadit Hornsby in follow-up in the neurology clinic on 08/09/2022.  The patient was last seen 4 months ago for intractable epilepsy. He is again accompanied by his parents who help supplement the history today.  Records and images were personally reviewed where available.  On his last visit, Cenobamate was added on, he is on 50mg  daily without side effects. He is also on Epidiolex 5mL BID, clobazam 10mg  daily, Vimpat 150mg  BID, Levetiracetam 2000mg  BID, Lamotrigine 200mg  TID, and Pregabalin 225mg  TID. They report 1-2 seizures a day (previously reporting 3-4 seizures daily), his last fall from a seizure was 2 weeks ago. His last ER visit for a fall was in January. He has occasional headaches, no dizziness, diplopia, focal numbness/tingling/weakness. No significant drowsiness. He has uncontrolled sleep apnea, he sleeps at 2am and wakes up at 2pm.    Seizure History: This is a 47 yo RH man with a history of seizures since age 65. He has no recollection of events, no prior warning, witnessed by his mother to have a generalized convulsion lasting 90 to 120 seconds. He was brought to [facility] then had another convulsion 2 days later. They recall trying different medications, Depakote caused liver dysfunction, he has failed Dilantin and Zonegran. He has been on Keppra, Lamictal, Lyrica, and most recently Vimpat.  He had an EMU admission at [facility], records unavailable for review, per notes he stayed for 36 hours and had generalized seizures, multiple foci.  They report two admissions for status epilepticus, one in September 2002 in the setting of weaning off Depakote, and another in January 2003.  Records unavailable for review.  His last GTC was around 5 years ago. He continued to have "petit mals" several times a day where his eyes would roll back, hands would shake for 30-45 seconds,  if standing he would fall and had required sutures and staples in the past. He had been having the "petit mals" several times daily until 3 weeks ago when he started having a different type of episode and the petit mals "completely stopped."  He was brought to the ER on 12/07/13 when his parents awoke to him dry having and speaking gibberish. He would be unable to speak, control his limbs, and cannot stand up without assistance. He can hear people around him but his jaw feels tight. The episodes can last for several hours. He has had 4 episodes in the past 3 weeks.  The patient reports that they have been occurring only during the weekends, however his parents remind him he had one on Wednesday. He went to his neurologist's office on 09/10 where he had an episode while walking to the exam room where he became dizzy and fell.  His father was able to catch him, and they reported his speech became affected. He was noted to be speaking in a whisper throughout the visit.  He does note that he becomes more dizzy after taking his medications.  Lamictal level was 17.8, Vimpat level 7.3.   The patient lives with his parents and brother. He is on disability and mostly stays at home playing video games. He graduated high school, no special ed classes. He endorses olfactory hallucinations but cannot describe it except saying they are the same all the time.   Epilepsy Risk Factors:  He had a skull fracture on the right side after a fall at  36 months of age. Otherwise he had a normal birth and early development.  There is no history of febrile convulsions, CNS infections such as meningitis/encephalitis, neurosurgical procedures, or family history of seizures.  Prior AEDs: Depakote, Dilantin, Zonegran  EEGs: 48-hour EEG (03/17/14 to 03/19/14) abnormal with multifocal discharges and right temporal slowing. Prior records note "generalized seizures, multiple foci" He had a 72-hour EEG (09/26/16 to 09/29/16) which was abnormal  with bursts and runs of focal slowing over the right temporal region, bursts and runs of diffuse rhythmic slowing with intermixed generalized spikes lasting up to 120 seconds without clinical correlate, multifocal epileptiform discharges seen over the right temporal, left frontal regions, as well as generalized spikes and polyspikes. During sleep, there were bursts of generalized fast activity. There were 2 clinicoelectrographic seizures captured with right head turn and some hypermotor activity that were non-lateralizing on EEG with diffuse background suppression at seizure onset.  MRI: I personally reviewed MRI brain with and without contrast done 12/26/2013 which did not show any acute intracranial abnormality, hippocampi symmetric without abnormal signal or enhancement.  He has had an extensive evaluation at [facility] in 2019 with repeat MRI brain 07/2017 showing no clear abnormality, PET scan with questionable decreased uptake on the left, Neuropsych testing suggesting more right-sided dysfunction. He underwent sEEG which was non-lateralizing, broad activity at seizure onset with low voltage fast activity from left and right, insular origin also suspicious. He had DBS placed last 10/2017 for non-resective stimulation treatment.   PAST MEDICAL HISTORY: Past Medical History:  Diagnosis Date   History of kidney stones    Seizures (HCC)    daily    MEDICATIONS: Current Outpatient Medications on File Prior to Visit  Medication Sig Dispense Refill   cannabidiol (EPIDIOLEX) 100 MG/ML solution Take 5 mL twice a day 300 mL 5   Cenobamate (XCOPRI) 50 MG TABS Take 1 tablet every night 30 tablet 5   cloBAZam (ONFI) 10 MG tablet TAKE 1 TABLET BY MOUTH AT BEDTIME 90 tablet 3   escitalopram (LEXAPRO) 10 MG tablet TAKE 1 TABLET BY MOUTH EVERY DAY 30 tablet 3   Lacosamide 150 MG TABS Take 1 tablet twice a day 180 tablet 3   lamoTRIgine (LAMICTAL) 100 MG tablet TAKE 2 TABLETS BY MOUTH 3 TIMES DAILY 180 tablet 11    levETIRAcetam (KEPPRA) 1000 MG tablet Take 2 tablets (2,000 mg total) by mouth 2 (two) times daily. 360 tablet 1   pregabalin (LYRICA) 225 MG capsule TAKE 1 CAPSULE BY MOUTH 3 TIMES DAILY 270 capsule 3   No current facility-administered medications on file prior to visit.    ALLERGIES: Allergies  Allergen Reactions   Penicillins Rash    Has patient had a PCN reaction causing immediate rash, facial/tongue/throat swelling, SOB or lightheadedness with hypotension: No Has patient had a PCN reaction causing severe rash involving mucus membranes or skin necrosis: No Has patient had a PCN reaction that required hospitalization No Has patient had a PCN reaction occurring within the last 10 years: No If all of the above answers are "NO", then may proceed with Cephalosporin use.     FAMILY HISTORY: Family History  Problem Relation Age of Onset   Healthy Mother    Healthy Father     SOCIAL HISTORY: Social History   Socioeconomic History   Marital status: Single    Spouse name: Not on file   Number of children: 0   Years of education: HS   Highest education level: Not on file  Occupational History   Not on file  Tobacco Use   Smoking status: Former    Packs/day: 0.50    Years: 4.00    Additional pack years: 0.00    Total pack years: 2.00    Types: Cigarettes    Quit date: 03/03/1995    Years since quitting: 27.4   Smokeless tobacco: Never  Vaping Use   Vaping Use: Never used  Substance and Sexual Activity   Alcohol use: No    Alcohol/week: 0.0 standard drinks of alcohol   Drug use: No   Sexual activity: Not on file  Other Topics Concern   Not on file  Social History Narrative   Patient is single and lives by his self  Single story just steps to get on front porch dose not use front steps.   Patient has a high school education.   Patient is right-handed.   Patient does not have any children.   Patient is on disability.   Patient drinks three sodas daily.   Social  Determinants of Health   Financial Resource Strain: Not on file  Food Insecurity: No Food Insecurity (01/03/2022)   Hunger Vital Sign    Worried About Running Out of Food in the Last Year: Never true    Ran Out of Food in the Last Year: Never true  Transportation Needs: No Transportation Needs (01/03/2022)   PRAPARE - Administrator, Civil Service (Medical): No    Lack of Transportation (Non-Medical): No  Physical Activity: Not on file  Stress: Not on file  Social Connections: Not on file  Intimate Partner Violence: Not on file     PHYSICAL EXAM: Vitals:   08/09/22 1401  BP: 125/74  Pulse: 84  SpO2: 98%   General: No acute distress Head:  Normocephalic/atraumatic Skin/Extremities: No rash, no edema Neurological Exam: alert and awake. No aphasia or dysarthria. Fund of knowledge is appropriate.  Attention and concentration are normal.   Cranial nerves:  No facial asymmetry.  Motor: moves all extremities symmetrically at least anti-gravity x 4.   VNS Therapy Management: Parameters Output Current (mA): 2 Signal Frequency (Hz): 20 Pulse Width (usec): 250 Signal ON Time (sec): 30 Signal OFF Time (min): 3 Magnet Output Current (mA): 2.25 Magnet ON Time (sec): 60 Magnet Pulse Width (usec): 500 AutoStim Output Current (mA): 2.25 AutoStim Pulse Width (usec): 250 AutoStim ON Time (sec): 60 Tachycardia Detection : On Heartbeat Detection Sensitivity: 3 Perform Verify Heartbeat Detection: yes Threshold for AutoStim (%): 20% Diagnostics Current Delievered (mA): 2mA Impedence Value (Ohms): 2093 Battery Status Indicator (color): Yellow (5-11%)   IMPRESSION: 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 Family reports seizures started at age 42. He is on 6 ASMs in addition to VNS and DBS with continued seizures. Lethargy/sedation has improved with reduction in Epidiolex. He reports 1-2 seizures daily, but this appears less than previously reported prior to  [place] initiation. Increase Xcopri to 100mg  daily. He will start weaning off Epidiolex. Continue Vimpat 150mg  BID, clobazam 10mg  qhs, Levetiracetam 2000mg  BID, Lamotrigine 200mg  TID, and Pregabalin 225mg  TID. Hopefully we can wean off clobazam to further streamline medications. VNS interrogated today, no changes made, battery 5-11%. He is not interested in battery replacement once this comes to end of service, however we discussed that if seizures increase or level of alertness is less, we would consider replacing battery. Follow-up in 4 months, call for any changes.    Thank you for allowing me to participate  in his care.  Please do not hesitate to call for any questions or concerns.    Patrcia Dolly, M.D.   CC: Dr. Durene Cal

## 2022-08-10 ENCOUNTER — Encounter: Payer: Self-pay | Admitting: Family Medicine

## 2022-08-12 ENCOUNTER — Other Ambulatory Visit: Payer: Self-pay

## 2022-08-12 DIAGNOSIS — Z1211 Encounter for screening for malignant neoplasm of colon: Secondary | ICD-10-CM

## 2022-08-18 ENCOUNTER — Other Ambulatory Visit: Payer: 59

## 2022-08-22 DIAGNOSIS — Z1211 Encounter for screening for malignant neoplasm of colon: Secondary | ICD-10-CM | POA: Diagnosis not present

## 2022-08-30 ENCOUNTER — Other Ambulatory Visit: Payer: Self-pay | Admitting: Neurology

## 2022-08-30 DIAGNOSIS — G40211 Localization-related (focal) (partial) symptomatic epilepsy and epileptic syndromes with complex partial seizures, intractable, with status epilepticus: Secondary | ICD-10-CM

## 2022-08-30 LAB — COLOGUARD: COLOGUARD: NEGATIVE

## 2022-09-15 ENCOUNTER — Other Ambulatory Visit: Payer: 59

## 2022-10-04 ENCOUNTER — Encounter: Payer: Self-pay | Admitting: Family Medicine

## 2022-10-05 ENCOUNTER — Other Ambulatory Visit: Payer: Self-pay

## 2022-10-05 DIAGNOSIS — N5089 Other specified disorders of the male genital organs: Secondary | ICD-10-CM

## 2022-10-13 ENCOUNTER — Ambulatory Visit
Admission: RE | Admit: 2022-10-13 | Discharge: 2022-10-13 | Disposition: A | Discharge status: Home / Self Care | Payer: 59 | Source: Ambulatory Visit | Attending: Family Medicine | Admitting: Family Medicine

## 2022-10-13 DIAGNOSIS — N5089 Other specified disorders of the male genital organs: Secondary | ICD-10-CM

## 2022-10-17 ENCOUNTER — Encounter: Payer: Self-pay | Admitting: Family Medicine

## 2022-12-12 ENCOUNTER — Encounter: Payer: Self-pay | Admitting: Neurology

## 2022-12-12 ENCOUNTER — Ambulatory Visit (INDEPENDENT_AMBULATORY_CARE_PROVIDER_SITE_OTHER): Payer: 59 | Admitting: Neurology

## 2022-12-12 VITALS — BP 118/71 | HR 77 | Ht 66.0 in | Wt 258.4 lb

## 2022-12-12 DIAGNOSIS — G40211 Localization-related (focal) (partial) symptomatic epilepsy and epileptic syndromes with complex partial seizures, intractable, with status epilepticus: Secondary | ICD-10-CM | POA: Diagnosis not present

## 2022-12-12 MED ORDER — CENOBAMATE 100 MG PO TABS: ORAL_TABLET | ORAL | 3 refills | Status: DC

## 2022-12-12 NOTE — Patient Instructions (Signed)
Always a pleasure to see you. Continue all your medications. Follow-up in 6 months, call for any changes.   Seizure Precautions: 1. If medication has been prescribed for you to prevent seizures, take it exactly as directed.  Do not stop taking the medicine without talking to your doctor first, even if you have not had a seizure in a long time.   2. Avoid activities in which a seizure would cause danger to yourself or to others.  Don't operate dangerous machinery, swim alone, or climb in high or dangerous places, such as on ladders, roofs, or girders.  Do not drive unless your doctor says you may.  3. If you have any warning that you may have a seizure, lay down in a safe place where you can't hurt yourself.    4.  No driving for 6 months from last seizure, as per [facility].   Please refer to the following link on the Epilepsy Foundation of America's website for more information: http://www.epilepsyfoundation.org/answerplace/Social/driving/drivingu.cfm   5.  Maintain good sleep hygiene.   6.  Contact your doctor if you have any problems that may be related to the medicine you are taking.  7.  Call 911 and bring the patient back to the ED if:        A.  The seizure lasts longer than 5 minutes.       B.  The patient doesn't awaken shortly after the seizure  C.  The patient has new problems such as difficulty seeing, speaking or moving  D.  The patient was injured during the seizure  E.  The patient has a temperature over 102 F (39C)  F.  The patient vomited and now is having trouble breathing

## 2022-12-12 NOTE — Progress Notes (Signed)
NEUROLOGY FOLLOW UP OFFICE NOTE  Gregg Young [id number] [birth year]  HISTORY OF PRESENT ILLNESS: I had the pleasure of seeing Gregg Young in follow-up in the neurology clinic on 12/12/2022.  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 He is again accompanied by his parents who help supplement the history today. Records and images were personally reviewed where available.  On his last visit, Gregg Young was increased to 100mg  at bedtime. He was given weaning instructions for Epidiolex and successfully weaned off medication without an increase in seizures. He continues on Vimpat 150mg  BID, clobazam 10mg  qhs, Levetiracetam 2000mg  BID, Lamotrigine 200mg  TID, and Pregabalin 225mg  TID. They are happy to report that despite having seizures every night, he has not had any falls due to seizure since January. He states he may go a day or two without seizures but his mother reports she witnesses seizures every night usually between 6-8pm. Sometimes he has a cluster of 4-6 in a row then they stop, usually after dinner. He is tolerating all his medications, he denies any significant dizziness. He may feel dizzy if he does not eat, his mother reports he only eats one full meal a day. He says he does not get hungry. He denies any headaches. There is no ptosis today. They are happy to report he is waking up earlier in the day, he used to wake up at 3pm, now he wakes up at 11am. He may take a nap in the afternoon, but no significant drowsiness as before.    Seizure History: This is a 47 yo RH man with a history of seizures since age 47. He has no recollection of events, no prior warning, witnessed by his mother to have a generalized convulsion lasting 90 to 120 seconds. He was brought to [facility] then had another convulsion 2 days later. They recall trying different medications, Depakote caused liver dysfunction, he has failed Dilantin and Zonegran. He has been  on Keppra, Lamictal, Lyrica, and most recently Vimpat.  He had an EMU admission at [facility], records unavailable for review, per notes he stayed for 36 hours and had generalized seizures, multiple foci.  They report two admissions for status epilepticus, one in September 2002 in the setting of weaning off Depakote, and another in January 2003.  Records unavailable for review.  His last GTC was around 5 years ago. He continued to have "petit mals" several times a day where his eyes would roll back, hands would shake for 30-45 seconds, if standing he would fall and had required sutures and staples in the past. He had been having the "petit mals" several times daily until 3 weeks ago when he started having a different type of episode and the petit mals "completely stopped."  He was brought to the ER on 12/07/13 when his parents awoke to him dry having and speaking gibberish. He would be unable to speak, control his limbs, and cannot stand up without assistance. He can hear people around him but his jaw feels tight. The episodes can last for several hours. He has had 4 episodes in the past 3 weeks.  The patient reports that they have been occurring only during the weekends, however his parents remind him he had one on Wednesday. He went to his neurologist's office on 09/10 where he had an episode while walking to the exam room where he became dizzy and fell.  His father was able to catch  him, and they reported his speech became affected. He was noted to be speaking in a whisper throughout the visit.  He does note that he becomes more dizzy after taking his medications.  Lamictal level was 17.8, Vimpat level 7.3.   The patient lives with his parents and brother. He is on disability and mostly stays at home playing video games. He graduated high school, no special ed classes. He endorses olfactory hallucinations but cannot describe it except saying they are the same all the time.   Epilepsy Risk Factors:  He had a  skull fracture on the right side after a fall at 48 months of age. Otherwise he had a normal birth and early development.  There is no history of febrile convulsions, CNS infections such as meningitis/encephalitis, neurosurgical procedures, or family history of seizures.  Prior AEDs: Depakote, Dilantin, Zonegran  EEGs: 48-hour EEG (03/17/14 to 03/19/14) abnormal with multifocal discharges and right temporal slowing. Prior records note "generalized seizures, multiple foci" He had a 72-hour EEG (09/26/16 to 09/29/16) which was abnormal with bursts and runs of focal slowing over the right temporal region, bursts and runs of diffuse rhythmic slowing with intermixed generalized spikes lasting up to 120 seconds without clinical correlate, multifocal epileptiform discharges seen over the right temporal, left frontal regions, as well as generalized spikes and polyspikes. During sleep, there were bursts of generalized fast activity. There were 2 clinicoelectrographic seizures captured with right head turn and some hypermotor activity that were non-lateralizing on EEG with diffuse background suppression at seizure onset.  MRI: I personally reviewed MRI brain with and without contrast done 12/26/2013 which did not show any acute intracranial abnormality, hippocampi symmetric without abnormal signal or enhancement.  He has had an extensive evaluation at [facility] in 2019 with repeat MRI brain 07/2017 showing no clear abnormality, PET scan with questionable decreased uptake on the left, Neuropsych testing suggesting more right-sided dysfunction. He underwent sEEG which was non-lateralizing, broad activity at seizure onset with low voltage fast activity from left and right, insular origin also suspicious. He had DBS placed last 10/2017 for non-resective stimulation treatment.   PAST MEDICAL HISTORY: Past Medical History:  Diagnosis Date   History of kidney stones    Seizures (HCC)    daily    MEDICATIONS: Current  Outpatient Medications on File Prior to Visit  Medication Sig Dispense Refill   cloBAZam (ONFI) 10 MG tablet TAKE 1 TABLET BY MOUTH AT BEDTIME 90 tablet 3   escitalopram (LEXAPRO) 10 MG tablet TAKE 1 TABLET BY MOUTH EVERY DAY 30 tablet 11   Lacosamide 150 MG TABS TAKE 1 TABLET BY MOUTH 2 TIMES DAILY 180 tablet 3   lamoTRIgine (LAMICTAL) 100 MG tablet TAKE 2 TABLETS BY MOUTH 3 TIMES DAILY 180 tablet 11   levETIRAcetam (KEPPRA) 1000 MG tablet TAKE 2 TABLETS BY MOUTH 2 TIMES DAILY 120 tablet 11   pregabalin (LYRICA) 225 MG capsule TAKE 1 CAPSULE BY MOUTH 3 TIMES DAILY 270 capsule 3   Cenobamate (XCOPRI) 50 MG TABS TAKE 1 TABLET BY MOUTH NIGHTLY 30 tablet 3   Cenobamate 100 MG TABS Take 1 tablet daily 30 tablet 5   No current facility-administered medications on file prior to visit.    ALLERGIES: Allergies  Allergen Reactions   Penicillins Rash    Has patient had a PCN reaction causing immediate rash, facial/tongue/throat swelling, SOB or lightheadedness with hypotension: No Has patient had a PCN reaction causing severe rash involving mucus membranes or skin necrosis: No Has patient  had a PCN reaction that required hospitalization No Has patient had a PCN reaction occurring within the last 10 years: No If all of the above answers are "NO", then may proceed with Cephalosporin use.     FAMILY HISTORY: Family History  Problem Relation Age of Onset   Healthy Mother    Healthy Father     SOCIAL HISTORY: Social History   Socioeconomic History   Marital status: Single    Spouse name: Not on file   Number of children: 0   Years of education: HS   Highest education level: Not on file  Occupational History   Not on file  Tobacco Use   Smoking status: Former    Current packs/day: 0.00    Average packs/day: 0.5 packs/day for 4.0 years (2.0 ttl pk-yrs)    Types: Cigarettes    Start date: 03/03/1991    Quit date: 03/03/1995    Years since quitting: 27.7   Smokeless tobacco: Never   Vaping Use   Vaping status: Never Used  Substance and Sexual Activity   Alcohol use: No    Alcohol/week: 0.0 standard drinks of alcohol   Drug use: No   Sexual activity: Not on file  Other Topics Concern   Not on file  Social History Narrative   Patient is single and lives by his self  Single story just steps to get on front porch dose not use front steps.   Patient has a high school education.   Patient is right-handed.   Patient does not have any children.   Patient is on disability.   Patient drinks three sodas daily.   Social Determinants of Health   Financial Resource Strain: Not on file  Food Insecurity: No Food Insecurity (01/03/2022)   Hunger Vital Sign    Worried About Running Out of Food in the Last Year: Never true    Ran Out of Food in the Last Year: Never true  Transportation Needs: No Transportation Needs (01/03/2022)   PRAPARE - Administrator, Civil Service (Medical): No    Lack of Transportation (Non-Medical): No  Physical Activity: Not on file  Stress: Not on file  Social Connections: Not on file  Intimate Partner Violence: Not on file     PHYSICAL EXAM: Vitals:   12/12/22 1516  BP: 118/71  Pulse: 77  SpO2: 94%   General: No acute distress Head:  Normocephalic/atraumatic Skin/Extremities: No rash, no edema Neurological Exam: alert and awake. No aphasia or dysarthria. Fund of knowledge is appropriate.  Attention and concentration are normal.   Cranial nerves: Pupils equal, round. Extraocular movements intact with no nystagmus, no ptosis. Visual fields full.  No facial asymmetry.  Motor: Bulk and tone normal, muscle strength 5/5 throughout with no pronator drift.   Finger to nose testing intact.  Gait narrow-based and steady, able to tandem walk adequately.  Romberg negative.  VNS Therapy Management: Parameters Output Current (mA): 2 Signal Frequency (Hz): 20 Pulse Width (usec): 250 Signal ON Time (sec): 30 Signal OFF Time (min):  3 Magnet Output Current (mA): 2.25 Magnet ON Time (sec): 60 Magnet Pulse Width (usec): 500 AutoStim Output Current (mA): 2.25 AutoStim Pulse Width (usec): 250 AutoStim ON Time (sec): 60 Tachycardia Detection : On Heartbeat Detection Sensitivity: 2 Perform Verify Heartbeat Detection: yes Threshold for AutoStim (%): 20% Diagnostics Current Delievered (mA): 2 Impedence Value (Ohms): 2160 Battery Status Indicator (color): Yellow (5-11%)   IMPRESSION: This is a 47 yo RH man with intractable multifocal  epilepsy. Family reports seizures started at age 62. He is on 6 ASMs in addition to VNS and DBS with continued seizures. He is now off Epidiolex. He has not had any falls due to seizures since January. Continue current regimen of Xcopri 100mg  at bedtime, Vimpat 150mg  BID, clobazam 10mg  qhs, Levetiracetam 2000mg  BID, Lamotrigine 200mg  TID, and Pregabalin 225mg  TID. Hopefully we can wean off clobazam to further streamline medications. VNS interrogated, no changes made, battery again 5-11%, he is not interested in battery replacement. Monitor for increase in seizures as VNS battery goes low/off. He does not drive. Follow-up in 6 months, call for any changes.    Thank you for allowing me to participate in his care.  Please do not hesitate to call for any questions or concerns.    Patrcia Dolly, M.D.   CC: Dr. Durene Cal

## 2022-12-17 ENCOUNTER — Encounter: Payer: Self-pay | Admitting: Family Medicine

## 2023-01-04 ENCOUNTER — Other Ambulatory Visit: Payer: Self-pay | Admitting: Neurology

## 2023-01-04 DIAGNOSIS — G40211 Localization-related (focal) (partial) symptomatic epilepsy and epileptic syndromes with complex partial seizures, intractable, with status epilepticus: Secondary | ICD-10-CM

## 2023-02-23 ENCOUNTER — Ambulatory Visit: Payer: 59

## 2023-02-23 VITALS — Wt 258.0 lb

## 2023-02-23 DIAGNOSIS — Z0000 Encounter for general adult medical examination without abnormal findings: Secondary | ICD-10-CM

## 2023-02-23 NOTE — Progress Notes (Signed)
Subjective:   Gregg Young is a 47 y.o. male who presents for an Initial Medicare Annual Wellness Visit.  Visit Complete: Virtual I connected with  Gregg Young on 02/23/23 by a audio enabled telemedicine application and verified that I am speaking with the correct person using two identifiers.  Patient Location: Home  Provider Location: Office/Clinic  I discussed the limitations of evaluation and management by telemedicine. The patient expressed understanding and agreed to proceed.  Vital Signs: Because this visit was a virtual/telehealth visit, some criteria may be missing or patient reported. Any vitals not documented were not able to be obtained and vitals that have been documented are patient reported.  ation answered by patient is correct and no changes since this date.  Cardiac Risk Factors include: male gender     Objective:    Today's Vitals   02/23/23 1459  Weight: 258 lb (117 kg)   Body mass index is 41.64 kg/m.     02/23/2023    3:04 PM 12/12/2022    3:16 PM 08/09/2022    2:05 PM 04/15/2022    3:37 PM 04/10/2022    7:52 PM 01/04/2022    3:19 PM 10/06/2021   12:37 PM  Advanced Directives  Does Patient Have a Medical Advance Directive? Yes Yes Yes Yes No Yes Yes  Type of Estate agent of [place];Living will Healthcare Power of [employer] Power of [employer] Power of [employer] Power of [employer] Power of Attorney  Copy of Healthcare Power of Attorney in Chart? No - copy requested        Would patient like information on creating a medical advance directive?     No - Patient declined      Current Medications (verified) Outpatient Encounter Medications as of 02/23/2023  Medication Sig   Cenobamate 100 MG TABS Take 1 tablet every night   cloBAZam (ONFI) 10 MG tablet TAKE 1 TABLET BY MOUTH AT BEDTIME   COMIRNATY syringe    escitalopram (LEXAPRO) 10 MG tablet TAKE 1 TABLET BY MOUTH EVERY DAY   FLUCELVAX  0.5 ML injection    Lacosamide 150 MG TABS TAKE 1 TABLET BY MOUTH 2 TIMES DAILY   lamoTRIgine (LAMICTAL) 100 MG tablet TAKE 2 TABLETS BY MOUTH 3 TIMES DAILY   levETIRAcetam (KEPPRA) 1000 MG tablet TAKE 2 TABLETS BY MOUTH 2 TIMES DAILY   pregabalin (LYRICA) 225 MG capsule TAKE 1 CAPSULE BY MOUTH 3 TIMES DAILY   No facility-administered encounter medications on file as of 02/23/2023.    Allergies (verified) Penicillins   History: Past Medical History:  Diagnosis Date   History of kidney stones    Seizures (HCC)    daily   Past Surgical History:  Procedure Laterality Date   EYE SURGERY     HERNIA REPAIR     baby   stimulator battery replacement  01/2020   @[facility]    VAGUS NERVE STIMULATOR INSERTION Left 03/10/2015   Procedure: LEFT SIDED PLACEMENT VAGAL NERVE STIMULATOR IMPLANT;  Surgeon: Lisbeth Renshaw, MD;  Location: [facility];  Service: Neurosurgery;  Laterality: Left;   Family History  Problem Relation Age of Onset   Healthy Mother    Healthy Father    Social History   Socioeconomic History   Marital status: Single    Spouse name: Not on file   Number of children: 0   Years of education: HS   Highest education level: Not on file  Occupational History  Not on file  Tobacco Use   Smoking status: Former    Current packs/day: 0.00    Average packs/day: 0.5 packs/day for 4.0 years (2.0 ttl pk-yrs)    Types: Cigarettes    Start date: 03/03/1991    Quit date: 03/03/1995    Years since quitting: 27.9   Smokeless tobacco: Never  Vaping Use   Vaping status: Never Used  Substance and Sexual Activity   Alcohol use: No    Alcohol/week: 0.0 standard drinks of alcohol   Drug use: No   Sexual activity: Not on file  Other Topics Concern   Not on file  Social History Narrative   Patient is single and lives by his self  Single story just steps to get on front porch dose not use front steps.   Patient has a high school education.   Patient is right-handed.   Patient  does not have any children.   Patient is on disability.   Patient drinks three sodas daily.   Social Determinants of Health   Financial Resource Strain: Low Risk  (02/23/2023)   Overall Financial Resource Strain (CARDIA)    Difficulty of Paying Living Expenses: Not hard at all  Food Insecurity: No Food Insecurity (02/23/2023)   Hunger Vital Sign    Worried About Running Out of Food in the Last Year: Never true    Ran Out of Food in the Last Year: Never true  Transportation Needs: No Transportation Needs (02/23/2023)   PRAPARE - Administrator, Civil Service (Medical): No    Lack of Transportation (Non-Medical): No  Physical Activity: Sufficiently Active (02/23/2023)   Exercise Vital Sign    Days of Exercise per Week: 5 days    Minutes of Exercise per Session: 30 min  Stress: No Stress Concern Present (02/23/2023)   [employer] of Occupational Health - Occupational Stress Questionnaire    Feeling of Stress : Not at all  Social Connections: Moderately Isolated (02/23/2023)   Social Connection and Isolation Panel [NHANES]    Frequency of Communication with Friends and Family: Never    Frequency of Social Gatherings with Friends and Family: Three times a week    Attends Religious Services: 1 to 4 times per year    Active Member of Clubs or Organizations: No    Attends Banker Meetings: Never    Marital Status: Never married    Tobacco Counseling Counseling given: Not Answered   Clinical Intake:  Pre-visit preparation completed: Yes  Pain : No/denies pain     BMI - recorded: 41.64 Nutritional Status: BMI > 30  Obese Nutritional Risks: None Diabetes: No  How often do you need to have someone help you when you read instructions, pamphlets, or other written materials from your doctor or pharmacy?: 1 - Never  Interpreter Needed?: No  Information entered by :: Lanier Ensign, LPN   Activities of Daily Living    02/23/2023    3:01 PM   In your present state of health, do you have any difficulty performing the following activities:  Hearing? 0  Vision? 0  Difficulty concentrating or making decisions? 0  Walking or climbing stairs? 0  Dressing or bathing? 0  Doing errands, shopping? 0  Preparing Food and eating ? N  Using the Toilet? N  In the past six months, have you accidently leaked urine? N  Do you have problems with loss of bowel control? N  Managing your Medications? N  Managing your Finances? N  Housekeeping or managing your Housekeeping? N    Patient Care Team: Shelva Majestic, MD as PCP - General (Family Medicine) Phylis Bougie, MD as Referring Physician (Neurology) Governor Rooks, MD as Consulting Physician (Neurosurgery) Van Clines, MD as Consulting Physician (Neurology)  Indicate any recent Medical Services you may have received from other than [facility] providers in the past year (date may be approximate).     Assessment:   This is a routine wellness examination for [place].  Hearing/Vision screen Hearing Screening - Comments:: Pt denies any hearing issues  Vision Screening - Comments:: Pt follows up with Dr Durene Cal for eye exams    Goals Addressed             This Visit's Progress    Patient Stated       Lose weight        Depression Screen    02/23/2023    3:04 PM 08/04/2022    2:30 PM 08/16/2019    2:39 PM  PHQ 2/9 Scores  PHQ - 2 Score 0 0 0  PHQ- 9 Score  0     Fall Risk    02/23/2023    3:05 PM 12/12/2022    3:16 PM 08/09/2022    2:04 PM 04/15/2022    3:37 PM 01/04/2022    3:19 PM  Fall Risk   Falls in the past year? 1 0 1 1 1   Number falls in past yr: 1 0 1 1 0  Injury with Fall? 1 0 1 1 0  Comment stitches on eye      Risk for fall due to : History of fall(s);Other (Comment)      Risk for fall due to: Comment seizure      Follow up Falls prevention discussed Falls evaluation completed Falls evaluation completed Falls evaluation completed      MEDICARE RISK AT HOME: Medicare Risk at Home Any stairs in or around the home?: No If so, are there any without handrails?: No Home free of loose throw rugs in walkways, pet beds, electrical cords, etc?: Yes Adequate lighting in your home to reduce risk of falls?: Yes Life alert?: No Use of a cane, walker or w/c?: No Grab bars in the bathroom?: No Shower chair or bench in shower?: No Elevated toilet seat or a handicapped toilet?: No  TIMED UP AND GO:  Was the test performed? No    Cognitive Function:        02/23/2023    3:07 PM  6CIT Screen  What Year? 0 points  What month? 0 points  What time? 0 points  Count back from 20 0 points  Months in reverse 0 points  Repeat phrase 0 points  Total Score 0 points    Immunizations Immunization History  Administered Date(s) Administered   Influenza Split 01/17/2011, 01/24/2012   Influenza Whole 01/24/2007, 01/09/2008, 01/14/2009, 12/24/2009   Influenza,inj,Quad PF,6+ Mos 01/16/2013, 01/15/2014, 01/26/2017, 01/31/2018, 12/31/2019, 01/12/2021, 12/16/2022   Influenza-Unspecified 01/13/2015, 02/13/2018, 01/03/2019   PFIZER Comirnaty(Gray Top)Covid-19 Tri-Sucrose Vaccine 12/29/2021   PFIZER(Purple Top)SARS-COV-2 Vaccination 06/07/2019, 07/22/2019, 03/03/2020   Pfizer Covid-19 Vaccine Bivalent Booster 63yrs & up 01/12/2021   Pfizer(Comirnaty)Fall Seasonal Vaccine 12 years and older 12/16/2022   Tdap 08/25/2017, 04/10/2022   Zoster Recombinant(Shingrix) 08/04/2022    TDAP status: Up to date  Flu Vaccine status: Up to date    Covid-19 vaccine status: Information provided on how to obtain vaccines.   Qualifies for Shingles Vaccine? No   Screening Tests  Health Maintenance  Topic Date Due   COVID-19 Vaccine (7 - 2023-24 season) 02/10/2023   Medicare Annual Wellness (AWV)  02/23/2024   Fecal DNA (Cologuard)  08/21/2025   DTaP/Tdap/Td (3 - Td or Tdap) 04/10/2032   INFLUENZA VACCINE  Completed   Hepatitis C Screening   Completed   HIV Screening  Completed   Pneumococcal Vaccine 19-20 Years old  Aged Out   HPV VACCINES  Aged Out    Health Maintenance  Health Maintenance Due  Topic Date Due   COVID-19 Vaccine (7 - 2023-24 season) 02/10/2023    Colorectal cancer screening: Type of screening: Cologuard. Completed 08/22/22. Repeat every 3 years   Additional Screening:  Hepatitis C Screening:  Completed 08/04/22  Vision Screening: Recommended annual ophthalmology exams for early detection of glaucoma and other disorders of the eye. Is the patient up to date with their annual eye exam?  Yes  Who is the provider or what is the name of the office in which the patient attends annual eye exams? Dr Durene Cal at appt  If pt is not established with a provider, would they like to be referred to a provider to establish care? No .   Dental Screening: Recommended annual dental exams for proper oral hygiene  Community Resource Referral / Chronic Care Management: CRR required this visit?  No   CCM required this visit?  No    Plan:     I have personally reviewed and noted the following in the patient's chart:   Medical and social history Use of alcohol, tobacco or illicit drugs  Current medications and supplements including opioid prescriptions. Patient is not currently taking opioid prescriptions. Functional ability and status Nutritional status Physical activity Advanced directives List of other physicians Hospitalizations, surgeries, and ER visits in previous 12 months Vitals Screenings to include cognitive, depression, and falls Referrals and appointments  In addition, I have reviewed and discussed with patient certain preventive protocols, quality metrics, and best practice recommendations. A written personalized care plan for preventive services as well as general preventive health recommendations were provided to patient.     Marzella Schlein, LPN   [id number]   After Visit Summary: (MyChart)  Due to this being a telephonic visit, the after visit summary with patients personalized plan was offered to patient via MyChart   Nurse Notes: none

## 2023-02-23 NOTE — Patient Instructions (Signed)
Mr. Huckleby , Thank you for taking time to come for your Medicare Wellness Visit. I appreciate your ongoing commitment to your health goals. Please review the following plan we discussed and let me know if I can assist you in the future.   Referrals/Orders/Follow-Ups/Clinician Recommendations: lose weight  Each day, aim for 6 glasses of water, plenty of protein in your diet and try to get up and walk/ stretch every hour for 5-10 minutes at a time.  Aim for 30 minutes of exercise or brisk walking, 6-8 glasses of water, and 5 servings of fruits and vegetables each day.   This is a list of the screening recommended for you and due dates:  Health Maintenance  Topic Date Due   COVID-19 Vaccine (7 - 2023-24 season) 02/10/2023   Medicare Annual Wellness Visit  02/23/2024   Cologuard (Stool DNA test)  08/21/2025   DTaP/Tdap/Td vaccine (3 - Td or Tdap) 04/10/2032   Flu Shot  Completed   Hepatitis C Screening  Completed   HIV Screening  Completed   Pneumococcal Vaccination  Aged Out   HPV Vaccine  Aged Out    Advanced directives: (Copy Requested) Please bring a copy of your health care power of attorney and living will to the office to be added to your chart at your convenience.  Next Medicare Annual Wellness Visit scheduled for next year: No

## 2023-04-05 HISTORY — PX: OTHER SURGICAL HISTORY: SHX169

## 2023-06-13 ENCOUNTER — Other Ambulatory Visit: Payer: Self-pay | Admitting: Neurology

## 2023-07-04 ENCOUNTER — Other Ambulatory Visit: Payer: Self-pay | Admitting: Neurology

## 2023-07-04 DIAGNOSIS — G40211 Localization-related (focal) (partial) symptomatic epilepsy and epileptic syndromes with complex partial seizures, intractable, with status epilepticus: Secondary | ICD-10-CM

## 2023-07-11 ENCOUNTER — Encounter: Payer: Self-pay | Admitting: Neurology

## 2023-07-11 ENCOUNTER — Ambulatory Visit (INDEPENDENT_AMBULATORY_CARE_PROVIDER_SITE_OTHER): Payer: Self-pay | Admitting: Neurology

## 2023-07-11 VITALS — BP 116/71 | HR 90 | Ht 66.0 in | Wt 274.4 lb

## 2023-07-11 DIAGNOSIS — F32A Depression, unspecified: Secondary | ICD-10-CM | POA: Diagnosis not present

## 2023-07-11 DIAGNOSIS — G40211 Localization-related (focal) (partial) symptomatic epilepsy and epileptic syndromes with complex partial seizures, intractable, with status epilepticus: Secondary | ICD-10-CM | POA: Diagnosis not present

## 2023-07-11 MED ORDER — LAMOTRIGINE 100 MG PO TABS: ORAL_TABLET | ORAL | 11 refills | Status: DC

## 2023-07-11 MED ORDER — LEVETIRACETAM 1000 MG PO TABS: 2000.0000 mg | ORAL_TABLET | Freq: Two times a day (BID) | ORAL | 11 refills | Status: DC

## 2023-07-11 MED ORDER — ESCITALOPRAM OXALATE 10 MG PO TABS: 10.0000 mg | ORAL_TABLET | Freq: Every day | ORAL | 11 refills | Status: DC

## 2023-07-11 MED ORDER — XCOPRI 150 MG PO TABS: ORAL_TABLET | ORAL | 3 refills | Status: DC

## 2023-07-11 NOTE — Progress Notes (Signed)
 NEUROLOGY FOLLOW UP OFFICE NOTE  Gregg Young [id number] 1975-11-07  HISTORY OF PRESENT ILLNESS: I had the pleasure of seeing Gregg Young in follow-up in the neurology clinic on 07/11/2023.  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 He is again accompanied by his mother who helps supplement the history today.  Records and images were personally reviewed where available.  He is currently on Xcopri 100mg  at bedtime, Vimpat 150mg  BID, clobazam 10mg  qhs, Levetiracetam 2000mg  BID, Lamotrigine 200mg  TID, and Pregabalin 225mg  TID. He continues to have daily brief seizures that occur consistently between 6-8pm, but has had only 1 fall from the seizures in the past 2 years. This occurred a month ago, he fell back into the sink drawer. He denies any side effects on medications. No dizziness, diplopia. He has headaches every now and then. He is sleeping at more reasonable hours, he goes to sleep at 1am (taking his AM dose at that time), wakes up at 9-10am, takes his noon dose at noon, then takes his night medications at 6pm. When his mother checks on his in the afternoon, he would be napping. He still keeps his right eye closed, when alerted to it, he is able to volitionally open the eye. He is on Lexapro 10mg  daily for mood.   Seizure History: This is a [name] with a history of seizures since age 10. He has no recollection of events, no prior warning, witnessed by his mother to have a generalized convulsion lasting 90 to 120 seconds. He was brought to [facility] then had another convulsion 2 days later. They recall trying different medications, Depakote caused liver dysfunction, he has failed Dilantin and Zonegran. He has been on Keppra, Lamictal, Lyrica, and most recently Vimpat.  He had an EMU admission at [facility], records unavailable for review, per notes he stayed for 36 hours and had generalized seizures, multiple foci.  They report two  admissions for status epilepticus, one in September 2002 in the setting of weaning off Depakote, and another in January 2003.  Records unavailable for review.  His last GTC was around 5 years ago. He continued to have "petit mals" several times a day where his eyes would roll back, hands would shake for 30-45 seconds, if standing he would fall and had required sutures and staples in the past. He had been having the "petit mals" several times daily until 3 weeks ago when he started having a different type of episode and the petit mals "completely stopped."  He was brought to the ER on 12/07/13 when his parents awoke to him dry having and speaking gibberish. He would be unable to speak, control his limbs, and cannot stand up without assistance. He can hear people around him but his jaw feels tight. The episodes can last for several hours. He has had 4 episodes in the past 3 weeks.  The patient reports that they have been occurring only during the weekends, however his parents remind him he had one on Wednesday. He went to his neurologist's office on 09/10 where he had an episode while walking to the exam room where he became dizzy and fell.  His father was able to catch him, and they reported his speech became affected. He was noted to be speaking in a whisper throughout the visit.  He does note that he becomes more dizzy after taking his medications.  Lamictal level was 17.8, Vimpat level  7.3.   The patient lives with his parents and brother. He is on disability and mostly stays at home playing video games. He graduated high school, no special ed classes. He endorses olfactory hallucinations but cannot describe it except saying they are the same all the time.   Epilepsy Risk Factors:  He had a skull fracture on the right side after a fall at 20 months of age. Otherwise he had a normal birth and early development.  There is no history of febrile convulsions, CNS infections such as meningitis/encephalitis,  neurosurgical procedures, or family history of seizures.  Prior AEDs: Depakote, Dilantin, Zonegran, Epidiolex  EEGs: 48-hour EEG (03/17/14 to 03/19/14) abnormal with multifocal discharges and right temporal slowing. Prior records note "generalized seizures, multiple foci" He had a 72-hour EEG (09/26/16 to 09/29/16) which was abnormal with bursts and runs of focal slowing over the right temporal region, bursts and runs of diffuse rhythmic slowing with intermixed generalized spikes lasting up to 120 seconds without clinical correlate, multifocal epileptiform discharges seen over the right temporal, left frontal regions, as well as generalized spikes and polyspikes. During sleep, there were bursts of generalized fast activity. There were 2 clinicoelectrographic seizures captured with right head turn and some hypermotor activity that were non-lateralizing on EEG with diffuse background suppression at seizure onset.  MRI: I personally reviewed MRI brain with and without contrast done 12/26/2013 which did not show any acute intracranial abnormality, hippocampi symmetric without abnormal signal or enhancement.  He has had an extensive evaluation at [facility] in 2019 with repeat MRI brain 07/2017 showing no clear abnormality, PET scan with questionable decreased uptake on the left, Neuropsych testing suggesting more right-sided dysfunction. He underwent sEEG which was non-lateralizing, broad activity at seizure onset with low voltage fast activity from left and right, insular origin also suspicious. He had DBS placed last 10/2017 for non-resective stimulation treatment.    PAST MEDICAL HISTORY: Past Medical History:  Diagnosis Date   History of kidney stones    Seizures (HCC)    daily    MEDICATIONS: Current Outpatient Medications on File Prior to Visit  Medication Sig Dispense Refill   cloBAZam (ONFI) 10 MG tablet TAKE 1 TABLET BY MOUTH AT BEDTIME 90 tablet 3   escitalopram (LEXAPRO) 10 MG tablet TAKE 1  TABLET BY MOUTH EVERY DAY 30 tablet 11   Lacosamide 150 MG TABS TAKE 1 TABLET BY MOUTH 2 TIMES DAILY 180 tablet 3   lamoTRIgine (LAMICTAL) 100 MG tablet TAKE 2 TABLETS BY MOUTH 3 TIMES DAILY 180 tablet 11   levETIRAcetam (KEPPRA) 1000 MG tablet TAKE 2 TABLETS BY MOUTH 2 TIMES DAILY 120 tablet 11   pregabalin (LYRICA) 225 MG capsule TAKE 1 CAPSULE BY MOUTH 3 TIMES DAILY 270 capsule 3   XCOPRI 100 MG TABS TAKE 1 TABLET BY MOUTH NIGHTLY 90 tablet 3   No current facility-administered medications on file prior to visit.    ALLERGIES: Allergies  Allergen Reactions   Penicillins Rash    Has patient had a PCN reaction causing immediate rash, facial/tongue/throat swelling, SOB or lightheadedness with hypotension: No Has patient had a PCN reaction causing severe rash involving mucus membranes or skin necrosis: No Has patient had a PCN reaction that required hospitalization No Has patient had a PCN reaction occurring within the last 10 years: No If all of the above answers are "NO", then may proceed with Cephalosporin use.     FAMILY HISTORY: Family History  Problem Relation Age of Onset   Healthy  Mother    Healthy Father     SOCIAL HISTORY: Social History   Socioeconomic History   Marital status: Single    Spouse name: Not on file   Number of children: 0   Years of education: HS   Highest education level: Not on file  Occupational History   Not on file  Tobacco Use   Smoking status: Former    Current packs/day: 0.00    Average packs/day: 0.5 packs/day for 4.0 years (2.0 ttl pk-yrs)    Types: Cigarettes    Start date: 03/03/1991    Quit date: 03/03/1995    Years since quitting: 28.3   Smokeless tobacco: Never  Vaping Use   Vaping status: Never Used  Substance and Sexual Activity   Alcohol use: No    Alcohol/week: 0.0 standard drinks of alcohol   Drug use: No   Sexual activity: Not on file  Other Topics Concern   Not on file  Social History Narrative   Patient is single  and lives by his self  Single story just steps to get on front porch dose not use front steps.   Patient has a high school education.   Patient is right-handed.   Patient does not have any children.   Patient is on disability.   Patient drinks three sodas daily.   Social Drivers of Corporate investment banker Strain: Low Risk  (02/23/2023)   Overall Financial Resource Strain (CARDIA)    Difficulty of Paying Living Expenses: Not hard at all  Food Insecurity: No Food Insecurity (02/23/2023)   Hunger Vital Sign    Worried About Running Out of Food in the Last Year: Never true    Ran Out of Food in the Last Year: Never true  Transportation Needs: No Transportation Needs (02/23/2023)   PRAPARE - Administrator, Civil Service (Medical): No    Lack of Transportation (Non-Medical): No  Physical Activity: Sufficiently Active (02/23/2023)   Exercise Vital Sign    Days of Exercise per Week: 5 days    Minutes of Exercise per Session: 30 min  Stress: No Stress Concern Present (02/23/2023)   [employer] of Occupational Health - Occupational Stress Questionnaire    Feeling of Stress : Not at all  Social Connections: Moderately Isolated (02/23/2023)   Social Connection and Isolation Panel [NHANES]    Frequency of Communication with Friends and Family: Never    Frequency of Social Gatherings with Friends and Family: Three times a week    Attends Religious Services: 1 to 4 times per year    Active Member of Clubs or Organizations: No    Attends Banker Meetings: Never    Marital Status: Never married  Intimate Partner Violence: Not At Risk (02/23/2023)   Humiliation, Afraid, Rape, and Kick questionnaire    Fear of Current or Ex-Partner: No    Emotionally Abused: No    Physically Abused: No    Sexually Abused: No     PHYSICAL EXAM: Vitals:   07/11/23 1435  BP: 116/71  Pulse: 90  SpO2: 92%   General: No acute distress Head:   Normocephalic/atraumatic Skin/Extremities: No rash, no edema Neurological Exam: alert and awake. No aphasia or dysarthria. Fund of knowledge is appropriate.  Attention and concentration are normal.   Cranial nerves: Pupils equal, round. Extraocular movements intact with horizontal gaze evoked nystagmus bilaterally. Visual fields full.  No facial asymmetry.  Motor: Bulk and tone normal, muscle strength 5/5 throughout with no  pronator drift.   Finger to nose testing intact.  Gait narrow-based and steady, able to tandem walk adequately.  Romberg negative.   IMPRESSION: 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 Family reports seizures started at age 58. He is on 6 ASMs in addition to VNS and DBS with continued seizures. Xcopri improved intensity of seizures with reduction in falls from seizure. Increase Xcopri to 150mg  daily, they will try taking it a noon and monitor if this helps with the evening clusters. Continue Vimpat 150mg  BID, clobazam 10mg  qhs, Levetiracetam 2000mg  BID, Lamotrigine 200mg  TID, and Pregabalin 225mg  TID. His mother will send an update in 2 weeks, if no issues, we will plan to gradually reduce Onfi to 1/2 tablet every night for 2 weeks, then stop. He does not drive. Follow-up in 4 months, call for any changes.  Thank you for allowing me to participate in his care.  Please do not hesitate to call for any questions or concerns.  Patrcia Dolly, M.D.   CC: Dr. Durene Cal

## 2023-07-11 NOTE — Patient Instructions (Signed)
 It's always good to see you.  Increase Xcopri to 150mg  daily. You can start taking it at noon and see if this helps with the evening seizures  2. Please update me on how you are feeling in 2 weeks, we will plan to reduce Onfi to 1/2 tablet every night at that point  3. Continue all your medications for now  4. Follow-up in 4 months, call for any changes   Seizure Precautions: 1. If medication has been prescribed for you to prevent seizures, take it exactly as directed.  Do not stop taking the medicine without talking to your doctor first, even if you have not had a seizure in a long time.   2. Avoid activities in which a seizure would cause danger to yourself or to others.  Don't operate dangerous machinery, swim alone, or climb in high or dangerous places, such as on ladders, roofs, or girders.  Do not drive unless your doctor says you may.  3. If you have any warning that you may have a seizure, lay down in a safe place where you can't hurt yourself.    4.  No driving for 6 months from last seizure, as per [facility].   Please refer to the following link on the Epile[employer] of [employer]'s website for more information: http://www.epilepsyfoundation.org/answerplace/Social/driving/drivingu.cfm   5.  Maintain good sleep hygiene.   6.  Contact your doctor if you have any problems that may be related to the medicine you are taking.  7.  Call 911 and bring the patient back to the ED if:        A.  The seizure lasts longer than 5 minutes.       B.  The patient doesn't awaken shortly after the seizure  C.  The patient has new problems such as difficulty seeing, speaking or moving  D.  The patient was injured during the seizure  E.  The patient has a temperature over 102 F (39C)  F.  The patient vomited and now is having trouble breathing

## 2023-09-04 ENCOUNTER — Other Ambulatory Visit: Payer: Self-pay | Admitting: Neurology

## 2023-10-12 ENCOUNTER — Encounter: Payer: Self-pay | Admitting: Neurology

## 2023-11-14 ENCOUNTER — Encounter: Payer: Self-pay | Admitting: Neurology

## 2023-11-14 ENCOUNTER — Ambulatory Visit (INDEPENDENT_AMBULATORY_CARE_PROVIDER_SITE_OTHER): Admitting: Neurology

## 2023-11-14 VITALS — BP 128/84 | HR 84 | Resp 20 | Ht 66.0 in | Wt 278.0 lb

## 2023-11-14 DIAGNOSIS — G40211 Localization-related (focal) (partial) symptomatic epilepsy and epileptic syndromes with complex partial seizures, intractable, with status epilepticus: Secondary | ICD-10-CM

## 2023-11-14 MED ORDER — XCOPRI 200 MG PO TABS
ORAL_TABLET | ORAL | 3 refills | Status: AC
Start: 2023-11-14 — End: ?

## 2023-11-14 NOTE — Patient Instructions (Signed)
 It's always good to see you.  Increase Xcopri  to 200mg : take 1 tablet daily mid-day  2. Continue all your other medications  3. Continue seizure diary  4. Follow-up in 3-4 months, call for any changes   Seizure Precautions: 1. If medication has been prescribed for you to prevent seizures, take it exactly as directed.  Do not stop taking the medicine without talking to your doctor first, even if you have not had a seizure in a long time.   2. Avoid activities in which a seizure would cause danger to yourself or to others.  Don't operate dangerous machinery, swim alone, or climb in high or dangerous places, such as on ladders, roofs, or girders.  Do not drive unless your doctor says you may.  3. If you have any warning that you may have a seizure, lay down in a safe place where you can't hurt yourself.    4.  No driving for 6 months from last seizure, as per [place]  state law.   Please refer to the following link on the Epilepsy [employer] of [employer]'s website for more information: http://www.epilepsyfoundation.org/answerplace/Social/driving/drivingu.cfm   5.  Maintain good sleep hygiene.  6.  Contact your doctor if you have any problems that may be related to the medicine you are taking.  7.  Call 911 and bring the patient back to the ED if:        A.  The seizure lasts longer than 5 minutes.       B.  The patient doesn't awaken shortly after the seizure  C.  The patient has new problems such as difficulty seeing, speaking or moving  D.  The patient was injured during the seizure  E.  The patient has a temperature over 102 F (39C)  F.  The patient vomited and now is having trouble breathing

## 2023-11-14 NOTE — Progress Notes (Signed)
 NEUROLOGY FOLLOW UP OFFICE NOTE  Gregg Young [id number] 1975/12/11  HISTORY OF PRESENT ILLNESS: I had the pleasure of seeing Gregg Young in follow-up in the neurology clinic on 11/14/2023.  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 He is again accompanied by his mother who helps supplement the history today.  Records and images were personally reviewed where available. On his last visit, Xcopri  was increased to 150mg  due to continued daily seizures, usually between 6-8pm. They were instructed to take the Xcopri  at mid-day and monitor if this helped. His mother contacted our office last month that there was no change in 5-10 brief seizures daily between 5-8pm. Thankfully he has not had any falls since January 2024. On review of prior visits, he had a period where he was having 1-2 brief seizures daily reported in May 2024 visit. He continues on Vimpat  150mg  BID, clobazam  10mg  qhs, Levetiracetam  2000mg  BID, Lamotrigine  200mg  TID, and Pregabalin  225mg  TID without side effects. He has a DBS placed in 2019 but they are not interested in [facility] follow-up since they feel it did not help. VNS was placed in 2016 and they had opted not to have battery replaced. His mother reports the seizures tend to occur always between the same time frame, he is very careful between 6-9pm and mostly stays off his feet to avoid any falls. He denies any significant headaches, dizziness, vision changes. Mood is just fine. He states he is getting up earlier now, however he still has an interrupted sleep schedule, going to bed from 2am-9am, then from 2pm to 5/6pm. He takes his medications at 2am, 11am, 5pm.    Seizure History: This is a [name] with a history of seizures since age 48. He has no recollection of events, no prior warning, witnessed by his mother to have a generalized convulsion lasting 90 to 120 seconds. He was brought to [facility] then had another  convulsion 2 days later. They recall trying different medications, Depakote caused liver dysfunction, he has failed Dilantin and Zonegran . He has been on Keppra , Lamictal , Lyrica , and most recently Vimpat .  He had an EMU admission at [facility], records unavailable for review, per notes he stayed for 36 hours and had generalized seizures, multiple foci.  They report two admissions for status epilepticus, one in September 2002 in the setting of weaning off Depakote, and another in January 2003.  Records unavailable for review.  His last GTC was around 5 years ago. He continued to have petit mals several times a day where his eyes would roll back, hands would shake for 30-45 seconds, if standing he would fall and had required sutures and staples in the past. He had been having the petit mals several times daily until 3 weeks ago when he started having a different type of episode and the petit mals completely stopped.  He was brought to the ER on 12/07/13 when his parents awoke to him dry having and speaking gibberish. He would be unable to speak, control his limbs, and cannot stand up without assistance. He can hear people around him but his jaw feels tight. The episodes can last for several hours. He has had 4 episodes in the past 3 weeks.  The patient reports that they have been occurring only during the weekends, however his parents remind him he had one on Wednesday. He went to his neurologist's office on 09/10 where he had an  episode while walking to the exam room where he became dizzy and fell.  His father was able to catch him, and they reported his speech became affected. He was noted to be speaking in a whisper throughout the visit.  He does note that he becomes more dizzy after taking his medications.  Lamictal  level was 17.8, Vimpat  level 7.3.   The patient lives with his parents and brother. He is on disability and mostly stays at home playing video games. He graduated high school, no special ed  classes. He endorses olfactory hallucinations but cannot describe it except saying they are the same all the time.   Epilepsy Risk Factors:  He had a skull fracture on the right side after a fall at 49 months of age. Otherwise he had a normal birth and early development.  There is no history of febrile convulsions, CNS infections such as meningitis/encephalitis, neurosurgical procedures, or family history of seizures.  Prior AEDs: Depakote, Dilantin, Zonegran , Epidiolex   EEGs: 48-hour EEG (03/17/14 to 03/19/14) abnormal with multifocal discharges and right temporal slowing. Prior records note generalized seizures, multiple foci He had a 72-hour EEG (09/26/16 to 09/29/16) which was abnormal with bursts and runs of focal slowing over the right temporal region, bursts and runs of diffuse rhythmic slowing with intermixed generalized spikes lasting up to 120 seconds without clinical correlate, multifocal epileptiform discharges seen over the right temporal, left frontal regions, as well as generalized spikes and polyspikes. During sleep, there were bursts of generalized fast activity. There were 2 clinicoelectrographic seizures captured with right head turn and some hypermotor activity that were non-lateralizing on EEG with diffuse background suppression at seizure onset.  MRI: I personally reviewed MRI brain with and without contrast done 12/26/2013 which did not show any acute intracranial abnormality, hippocampi symmetric without abnormal signal or enhancement.  He has had an extensive evaluation at [facility] in 2019 with repeat MRI brain 07/2017 showing no clear abnormality, PET scan with questionable decreased uptake on the left, Neuropsych testing suggesting more right-sided dysfunction. He underwent sEEG which was non-lateralizing, broad activity at seizure onset with low voltage fast activity from left and right, insular origin also suspicious. He had DBS placed last 10/2017 for non-resective stimulation  treatment.    PAST MEDICAL HISTORY: Past Medical History:  Diagnosis Date   History of kidney stones    Seizures (HCC)    daily    MEDICATIONS: Current Outpatient Medications on File Prior to Visit  Medication Sig Dispense Refill   Cenobamate  (XCOPRI ) 150 MG TABS Take 1 tablet daily at noon 90 tablet 3   cloBAZam  (ONFI ) 10 MG tablet TAKE 1 TABLET BY MOUTH AT BEDTIME 90 tablet 3   escitalopram  (LEXAPRO ) 10 MG tablet TAKE 1 TABLET BY MOUTH EVERY DAY 30 tablet 2   Lacosamide  150 MG TABS TAKE 1 TABLET BY MOUTH 2 TIMES DAILY 180 tablet 3   lamoTRIgine  (LAMICTAL ) 100 MG tablet TAKE 2 TABLETS BY MOUTH 3 TIMES DAILY 180 tablet 2   levETIRAcetam  (KEPPRA ) 1000 MG tablet TAKE 2 TABLETS BY MOUTH 2 TIMES DAILY 120 tablet 2   pregabalin  (LYRICA ) 225 MG capsule TAKE 1 CAPSULE BY MOUTH 3 TIMES DAILY 270 capsule 3   No current facility-administered medications on file prior to visit.    ALLERGIES: Allergies  Allergen Reactions   Penicillins Rash    Has patient had a PCN reaction causing immediate rash, facial/tongue/throat swelling, SOB or lightheadedness with hypotension: No Has patient had a PCN reaction causing severe rash  involving mucus membranes or skin necrosis: No Has patient had a PCN reaction that required hospitalization No Has patient had a PCN reaction occurring within the last 10 years: No If all of the above answers are NO, then may proceed with Cephalosporin use.     FAMILY HISTORY: Family History  Problem Relation Age of Onset   Healthy Mother    Healthy Father     SOCIAL HISTORY: Social History   Socioeconomic History   Marital status: Single    Spouse name: Not on file   Number of children: 0   Years of education: HS   Highest education level: Not on file  Occupational History   Not on file  Tobacco Use   Smoking status: Former    Current packs/day: 0.00    Average packs/day: 0.5 packs/day for 4.0 years (2.0 ttl pk-yrs)    Types: Cigarettes    Start  date: 03/03/1991    Quit date: 03/03/1995    Years since quitting: 28.7   Smokeless tobacco: Never  Vaping Use   Vaping status: Never Used  Substance and Sexual Activity   Alcohol use: No    Alcohol/week: 0.0 standard drinks of alcohol   Drug use: No   Sexual activity: Not on file  Other Topics Concern   Not on file  Social History Narrative   Patient is single and lives by his self  Single story just steps to get on front porch dose not use front steps.   Patient has a high school education.   Patient is right-handed.   Patient does not have any children.   Patient is on disability.   Patient drinks three sodas daily.   Social Drivers of Corporate investment banker Strain: Low Risk  (02/23/2023)   Overall Financial Resource Strain (CARDIA)    Difficulty of Paying Living Expenses: Not hard at all  Food Insecurity: No Food Insecurity (02/23/2023)   Hunger Vital Sign    Worried About Running Out of Food in the Last Year: Never true    Ran Out of Food in the Last Year: Never true  Transportation Needs: No Transportation Needs (02/23/2023)   PRAPARE - Administrator, Civil Service (Medical): No    Lack of Transportation (Non-Medical): No  Physical Activity: Sufficiently Active (02/23/2023)   Exercise Vital Sign    Days of Exercise per Week: 5 days    Minutes of Exercise per Session: 30 min  Stress: No Stress Concern Present (02/23/2023)   [employer] of Occupational Health - Occupational Stress Questionnaire    Feeling of Stress : Not at all  Social Connections: Moderately Isolated (02/23/2023)   Social Connection and Isolation Panel    Frequency of Communication with Friends and Family: Never    Frequency of Social Gatherings with Friends and Family: Three times a week    Attends Religious Services: 1 to 4 times per year    Active Member of Clubs or Organizations: No    Attends Banker Meetings: Never    Marital Status: Never married   Intimate Partner Violence: Not At Risk (02/23/2023)   Humiliation, Afraid, Rape, and Kick questionnaire    Fear of Current or Ex-Partner: No    Emotionally Abused: No    Physically Abused: No    Sexually Abused: No     PHYSICAL EXAM: Vitals:   11/14/23 1510  BP: 128/84  Pulse: 84  Resp: 20  SpO2: 95%   General: No acute distress  Head:  Normocephalic/atraumatic Skin/Extremities: No rash, no edema Neurological Exam: alert and awake. No aphasia or dysarthria. Fund of knowledge is appropriate.  Attention and concentration are normal.   Cranial nerves: Pupils equal, round. Extraocular movements intact with no nystagmus. No ptosis today. Visual fields full.  No facial asymmetry.  Motor: Bulk and tone normal, muscle strength 5/5 throughout with no pronator drift.   Finger to nose testing intact.  Gait narrow-based and steady, no ataxia.   VNS Therapy Management: Parameters Output Current (mA): Disabled 2Hz  Signal Frequency (Hz): 20 Pulse Width (usec): 250 Signal ON Time (sec): 30 Signal OFF Time (min): 3 Magnet Output Current (mA): 2.25 Magnet ON Time (sec): 500 Magnet Pulse Width (usec): 60 AutoStim Output Current (mA): NA AutoStim Pulse Width (usec): NA AutoStim ON Time (sec): NA Tachycardia Detection : Off Heartbeat Detection Sensitivity:  (Disabled) Threshold for AutoStim (%): NA Diagnostics Output Current: Disabled Battery Status Indicator (color): Red (NEOS 0-5%)    IMPRESSION: 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 Family reports seizures started at age 60. He is on 6 ASMs, he had VNS and DBS placement however VNS battery is now EOS, likely DBS as well since he has not been seen at [facility] for several years. They continue to report 5-10 seizures daily, increase Xcopri  to 200mg  daily. Continue Lacosamide  150mg  BID, clobazam  10mg  qhs, Levetiracetam  2000mg  BID, Lamotrigine  200mg  TID, and Pregabalin  225mg  TID. We discussed concern that at one point he had  less daily seizures, I wonder if the VNS (or DBS) was helping. If falls recur, would reconsider VNS battery replacement. He does not drive. Follow-up in 3-4 months, call for any changes.   Thank you for allowing me to participate in his care.  Please do not hesitate to call for any questions or concerns.    Darice Shivers, M.D.   CC: Dr. Katrinka

## 2023-11-15 MED ORDER — XCOPRI 50 MG PO TABS
ORAL_TABLET | ORAL | 0 refills | Status: DC
Start: 2023-11-15 — End: 2024-02-13

## 2023-12-11 ENCOUNTER — Encounter: Payer: Self-pay | Admitting: Family Medicine

## 2023-12-11 ENCOUNTER — Encounter: Payer: Self-pay | Admitting: Neurology

## 2023-12-25 ENCOUNTER — Encounter: Payer: Self-pay | Admitting: Family Medicine

## 2024-01-01 ENCOUNTER — Telehealth: Payer: Self-pay | Admitting: Neurology

## 2024-01-01 ENCOUNTER — Other Ambulatory Visit: Payer: Self-pay | Admitting: Neurology

## 2024-01-01 DIAGNOSIS — G40211 Localization-related (focal) (partial) symptomatic epilepsy and epileptic syndromes with complex partial seizures, intractable, with status epilepticus: Secondary | ICD-10-CM

## 2024-01-01 MED ORDER — CLOBAZAM 10 MG PO TABS: ORAL_TABLET | ORAL | Status: DC

## 2024-01-01 NOTE — Telephone Encounter (Signed)
 Spoke to mother. He went several months without a fall but now has had 3 falls (Friday, Saturday, Sunday) with the seizures. Seizure frequency is unchanged with increase in Xcopri  to 200mg  at bedtime last month but now he is falling again. Advised to give additional Clobazam  10mg  in the afternoon. She asks about giving 10mg  at lunch, then taking the 10mg  at 8pm. The inc seizures and falls usually occur between 5-7pm. Agreed on this regimen. Also discussed that potentially the VNS was helping with seizures and replacing battery. They are agreeable, referral to Dr. Lanis will be sent.

## 2024-01-01 NOTE — Telephone Encounter (Signed)
 Pt c/o: seizure Missed medications?  No. Sleep deprived?  Yes, sleep apnea Alcohol intake?  No. Increased stress? No. Any change in medication color or shape? No. Back to their usual baseline self?  Yes.  . If no, advise go to ER Current medications prescribed by Dr. Georjean: medlist

## 2024-01-01 NOTE — Telephone Encounter (Signed)
 Pt.s mom says Pt has had multiple Sx's and 3 falls in the last 3 days would like a call bk

## 2024-01-04 ENCOUNTER — Other Ambulatory Visit: Payer: Self-pay | Admitting: *Deleted

## 2024-01-04 DIAGNOSIS — G40211 Localization-related (focal) (partial) symptomatic epilepsy and epileptic syndromes with complex partial seizures, intractable, with status epilepticus: Secondary | ICD-10-CM

## 2024-01-05 MED ORDER — LACOSAMIDE 150 MG PO TABS: 1.0000 | ORAL_TABLET | Freq: Two times a day (BID) | ORAL | 3 refills | Status: AC

## 2024-01-15 ENCOUNTER — Telehealth: Payer: Self-pay | Admitting: Neurology

## 2024-01-15 NOTE — Telephone Encounter (Signed)
 Consult refaxed

## 2024-01-15 NOTE — Telephone Encounter (Signed)
 Office has not rcvd referral as of 01/09/24, please refax Phone# [phone] Dr. Lanis (surgeon) Fax# [phone] ATTN: NEW PATIENT COORDINATOR- REQUESTING VNS CONSULT

## 2024-01-23 ENCOUNTER — Other Ambulatory Visit: Payer: Self-pay | Admitting: Neurosurgery

## 2024-01-27 ENCOUNTER — Encounter: Payer: Self-pay | Admitting: Neurology

## 2024-02-08 NOTE — Pre-Procedure Instructions (Signed)
 Surgical Instructions   Your procedure is scheduled on February 13, 2024. Report to [facility] Main Entrance A at 11:00 A.M., then check in with the Admitting office. Any questions or running late day of surgery: call [phone]  Questions prior to your surgery date: call [phone], Monday-Friday, 8am-4pm. If you experience any cold or flu symptoms such as cough, fever, chills, shortness of breath, etc. between now and your scheduled surgery, please notify us  at the above number.     Remember:  Do not eat or drink after midnight the night before your surgery. No gum, mints, or hard candy.       Take these medicines the morning of surgery with A SIP OF WATER: cenobamate  (XCOPRI )  cloBAZam  (ONFI )  escitalopram  (LEXAPRO )  Lacosamide   lamoTRIgine  (LAMICTAL )  levETIRAcetam  (KEPPRA )  pregabalin  (LYRICA )   May take these medicines IF NEEDED: none    One week prior to surgery, STOP taking any Aspirin (unless otherwise instructed by your surgeon) Aleve , Naproxen , Ibuprofen, Motrin, Advil, Goody's, BC's, all herbal medications, fish oil, and non-prescription vitamins.                     Do NOT Smoke (Tobacco/Vaping) for 24 hours prior to your procedure.  If you use a CPAP at night, you may bring your mask/headgear for your overnight stay.   You will be asked to remove any contacts, glasses, piercing's, hearing aid's, dentures/partials prior to surgery. Please bring cases for these items if needed.    Patients discharged the day of surgery will not be allowed to drive home, and someone needs to stay with them for 24 hours.  SURGICAL WAITING ROOM VISITATION Patients may have no more than 2 support people in the waiting area - these visitors may rotate.   Pre-op nurse will coordinate an appropriate time for 1 ADULT support person, who may not rotate, to accompany patient in pre-op.  Children under the age of 77 must have an adult with them who is not the patient and must remain  in the main waiting area with an adult.  If the patient needs to stay at the hospital during part of their recovery, the visitor guidelines for inpatient rooms apply.  Please refer to the [facility] website for the visitor guidelines for any additional information.   If you received a COVID test during your pre-op visit  it is requested that you wear a mask when out in public, stay away from anyone that may not be feeling well and notify your surgeon if you develop symptoms. If you have been in contact with anyone that has tested positive in the last 10 days please notify you surgeon.      Pre-operative CHG Bathing Instructions   You can play a key role in reducing the risk of infection after surgery. Your skin needs to be as free of germs as possible. You can reduce the number of germs on your skin by washing with CHG (chlorhexidine  gluconate) soap before surgery. CHG is an antiseptic soap that kills germs and continues to kill germs even after washing.   DO NOT use if you have an allergy to chlorhexidine /CHG or antibacterial soaps. If your skin becomes reddened or irritated, stop using the CHG and notify one of our RNs at [phone].              TAKE A SHOWER THE NIGHT BEFORE SURGERY   Please keep in mind the following:  DO NOT shave, including legs  and underarms, 48 hours prior to surgery.   You may shave your face before/day of surgery.  Place clean sheets on your bed the night before surgery Use a clean washcloth (not used since being washed) for shower. DO NOT sleep with pet's night before surgery.  CHG Shower Instructions:  Wash your face and private area with normal soap. If you choose to wash your hair, wash first with your normal shampoo.  After you use shampoo/soap, rinse your hair and body thoroughly to remove shampoo/soap residue.  Turn the water OFF and apply half the bottle of CHG soap to a CLEAN washcloth.  Apply CHG soap ONLY FROM YOUR NECK DOWN TO YOUR TOES  (washing for 3-5 minutes)  DO NOT use CHG soap on face, private areas, open wounds, or sores.  Pay special attention to the area where your surgery is being performed.  If you are having back surgery, having someone wash your back for you may be helpful. Wait 2 minutes after CHG soap is applied, then you may rinse off the CHG soap.  Pat dry with a clean towel  Put on clean pajamas    Additional instructions for the day of surgery: If you choose, you may shower the morning of surgery with an antibacterial soap.  DO NOT APPLY any lotions, deodorants, cologne, or perfumes.   Do not wear jewelry or makeup Do not wear nail polish, gel polish, artificial nails, or any other type of covering on natural nails (fingers and toes) Do not bring valuables to the hospital. [facility] is not responsible for valuables/personal belongings. Put on clean/comfortable clothes.  Please brush your teeth.  Ask your nurse before applying any prescription medications to the skin.

## 2024-02-09 ENCOUNTER — Encounter (HOSPITAL_COMMUNITY): Payer: Self-pay

## 2024-02-09 ENCOUNTER — Encounter (HOSPITAL_COMMUNITY)
Admission: RE | Admit: 2024-02-09 | Discharge: 2024-02-09 | Disposition: A | Discharge status: Home / Self Care | Source: Ambulatory Visit | Attending: Neurosurgery | Admitting: Neurosurgery

## 2024-02-09 ENCOUNTER — Other Ambulatory Visit: Payer: Self-pay

## 2024-02-09 VITALS — BP 108/73 | HR 82 | Temp 98.1°F | Resp 17 | Ht 66.0 in | Wt 279.0 lb

## 2024-02-09 DIAGNOSIS — G4733 Obstructive sleep apnea (adult) (pediatric): Secondary | ICD-10-CM | POA: Diagnosis not present

## 2024-02-09 DIAGNOSIS — G988 Other disorders of nervous system: Secondary | ICD-10-CM | POA: Insufficient documentation

## 2024-02-09 DIAGNOSIS — Z01818 Encounter for other preprocedural examination: Secondary | ICD-10-CM | POA: Insufficient documentation

## 2024-02-09 DIAGNOSIS — Z87891 Personal history of nicotine dependence: Secondary | ICD-10-CM | POA: Insufficient documentation

## 2024-02-09 DIAGNOSIS — G40A09 Absence epileptic syndrome, not intractable, without status epilepticus: Secondary | ICD-10-CM | POA: Insufficient documentation

## 2024-02-09 HISTORY — DX: Sleep apnea, unspecified: G47.30

## 2024-02-09 LAB — CBC
HCT: 41.9 % (ref 39.0–52.0)
Hemoglobin: 14.2 g/dL (ref 13.0–17.0)
MCH: 33.3 pg (ref 26.0–34.0)
MCHC: 33.9 g/dL (ref 30.0–36.0)
MCV: 98.1 fL (ref 80.0–100.0)
Platelets: 291 K/uL (ref 150–400)
RBC: 4.27 MIL/uL (ref 4.22–5.81)
RDW: 12.9 % (ref 11.5–15.5)
WBC: 6.6 K/uL (ref 4.0–10.5)
nRBC: 0 % (ref 0.0–0.2)

## 2024-02-09 LAB — BASIC METABOLIC PANEL WITH GFR
Anion gap: 9 (ref 5–15)
BUN: 10 mg/dL (ref 6–20)
CO2: 29 mmol/L (ref 22–32)
Calcium: 8.7 mg/dL — ABNORMAL LOW (ref 8.9–10.3)
Chloride: 100 mmol/L (ref 98–111)
Creatinine, Ser: 0.94 mg/dL (ref 0.61–1.24)
GFR, Estimated: >60 mL/min (ref >60)
Glucose, Bld: 110 mg/dL — ABNORMAL HIGH (ref 70–99)
Potassium: 3.9 mmol/L (ref 3.5–5.1)
Sodium: 138 mmol/L (ref 135–145)

## 2024-02-09 NOTE — Progress Notes (Signed)
 PCP - Katrinka Garnette KIDD, MD Cardiologist - denies Neurologist- Dr. Darice Shivers  PPM/ICD - denies  DBS and VNS present- per mother, both batteries are dead. She will still bring her controller for both devices DOS.   Chest x-ray - N/A EKG - N/A Stress Test - denies ECHO - denies Cardiac Cath - denies  Sleep Study - 05/24/17- no CPAP   Fasting Blood Sugar - N/A  Last dose of GLP1 agonist-  N/A  Blood Thinner Instructions: N/A Aspirin Instructions:N/A  ERAS Protcol - NPO order  COVID TEST- N/A   Anesthesia review: yes- history of daily seizures (multiple times a day). Mom describes these as petit mal seizures where he appears to be not aware of his surroundings and his arms and head move. If he is standing, then he falls.   Patient states that he is unable to write, and requested that his mother sign his consent form for him. Mother is his HCPOA. She will try to bring paperwork DOS.   Patient denies shortness of breath, fever, cough and chest pain at PAT appointment   All instructions explained to the patient, with a verbal understanding of the material. Patient agrees to go over the instructions while at home for a better understanding. The opportunity to ask questions was provided.

## 2024-02-12 ENCOUNTER — Encounter: Payer: Self-pay | Admitting: Neurology

## 2024-02-12 NOTE — Anesthesia Preprocedure Evaluation (Signed)
 Anesthesia Evaluation  Patient identified by MRN, date of birth, ID band Patient awake    Reviewed: Allergy & Precautions, NPO status , Patient's Chart, lab work & pertinent test results  History of Anesthesia Complications Negative for: history of anesthetic complications  Airway Mallampati: II  TM Distance: >3 FB Neck ROM: Full    Dental  (+) Teeth Intact   Pulmonary neg shortness of breath, sleep apnea , neg COPD, neg recent URI, former smoker, neg PE   breath sounds clear to auscultation       Cardiovascular negative cardio ROS  Rhythm:Regular     Neuro/Psych Seizures -, Poorly Controlled,   negative psych ROS   GI/Hepatic negative GI ROS, Neg liver ROS,,,  Endo/Other    Class 3 obesity  Renal/GU negative Renal ROS     Musculoskeletal   Abdominal   Peds  Hematology negative hematology ROS (+)   Anesthesia Other Findings   Reproductive/Obstetrics                              Anesthesia Physical Anesthesia Plan  ASA: 4  Anesthesia Plan: General   Post-op Pain Management: Ofirmev  IV (intra-op)*   Induction: Intravenous  PONV Risk Score and Plan: 2 and Dexamethasone , Treatment may vary due to age or medical condition and Ondansetron   Airway Management Planned: Oral ETT and LMA  Additional Equipment: None  Intra-op Plan:   Post-operative Plan: Extubation in OR  Informed Consent: I have reviewed the patients History and Physical, chart, labs and discussed the procedure including the risks, benefits and alternatives for the proposed anesthesia with the patient or authorized representative who has indicated his/her understanding and acceptance.     Dental advisory given  Plan Discussed with: CRNA, Surgeon and Anesthesiologist  Anesthesia Plan Comments: (PAT note written 02/12/2024 by Isaiah Ruder, PA-C.    DISCUSSION: Patient is a 48 year old male scheduled for the  above procedure.    History includes former smoker (quit 1996), intractable epilepsy (s/p left sided Vagal nerve stimulator implant 03/10/2015; deep brain stimulator stage 2/2 11/03/2017 with replacement generator 01/16/2020), OSA (severe 05/2017; does not use CPAP).   Last neurology visit with Dr. 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 He is also on 6 anti-seizure medications. Right skull fracture at 71 months of age, but otherwise had normal birth and early development. Graduated hight school. Seizures began around age 60 years old. VNS battery was not replaced when it was at EOS since family was not convinced it was helping control his seizures; However, given increased frequency of petit mal seizures despite medication adjustments, Dr. Georjean discussed possibility that VNS and/or DBS were helping since seizures had been better controlled at one point. Notes say they opted not to replace VNS battery since family did not think it helped with his seizures. Sine then, mother reported unchanged seizure frequency (described by mother as not being aware of surroundings can have head and arm movement, and may involve falling if standing at time of his seizure), so decision to proceed with battery replacement of VNS.    Anesthesia team to evaluate on the day of surgery. He had severe OSA by sleep study in 2019--does not use CPAP.   )         Anesthesia Quick Evaluation

## 2024-02-12 NOTE — Progress Notes (Signed)
 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   History includes former smoker (quit 1996), intractable epilepsy (s/p left sided Vagal nerve stimulator implant 03/10/2015; deep brain stimulator stage 2/2 11/03/2017 with replacement generator 01/16/2020), OSA (severe 05/2017; does not use CPAP).  Last neurology visit with Dr. 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 He is also on 6 anti-seizure medications. Right skull fracture at 55 months of age, but otherwise had normal birth and early development. Graduated h[employer] school. Seizures began around age 40 years old. VNS battery was not replaced when it was at [facility] since family was not convinced it was helping control his seizures; However, given increased frequency of petit mal seizures despite medication adjustments, Dr. Georjean discussed possibility that VNS and/or DBS were helping since seizures had been better controlled at one point. Notes say they opted not to replace VNS battery since family did not think it helped with his seizures. Sine then, mother reported unchanged seizure frequency (described by mother as not being aware of surroundings can have head and arm movement, and may involve falling if standing at time of his seizure), so decision to proceed with battery replacement of VNS.   Anesthesia team to evaluate on the day of surgery. He had severe OSA by sleep study in 2019--does not use CPAP.     VS: BP 108/73   Pulse 82   Temp 36.7 C   Resp 17   Ht 5' 6  (1.676 m)   Wt 126.6 kg   SpO2 94%   BMI 45.03 kg/m   PROVIDERS: Katrinka Garnette KIDD, MD is PCP  Georjean Pao, MD is neurologist   LABS: Labs reviewed: Acceptable for surgery. A1c 5.2% in May 2024.  (all labs ordered are listed, but only abnormal results are displayed)  Labs Reviewed  BASIC METABOLIC PANEL WITH GFR - Abnormal; Notable for the following components:      Result Value   Glucose, Bld 110 (*)    Calcium 8.7 (*)    All other components within normal limits  CBC    Sleep Study 05/18/2017: IMPRESSIONS - Severe Obstructive sleep apnea occurred during the diagnostic portion of the study (AHI = 36.6 /hour).  - Moderate central sleep apnea occurred during the diagnostic portion of the study (CAI = 1.0/hour). - Complex Apnea pattern as Central apneas appeared with CPAP, requiring change to BIPAP titration. Final setting BIPAP 18/ 14. - Severe oxygen desaturation was noted during the diagnostic portion of the study (Min O2 = 84.00%). - The patient snored with soft snoring volume during the diagnostic portion of the study. - No cardiac abnormalities were noted during this study. - Clinically significant periodic limb movements of sleep did not occur during the study. - Seizure activity was not seen. RECOMMENDATIONS - Trial of BiPAP therapy on 18/14 cm H2O. Patient used a Medium size Resmed Full Face Mask AirFit F20 mask and heated humidification...   IMAGES: CT Head 09/16/2021: IMPRESSION: 1. Mild left frontal superficial soft tissue swelling. 2. No acute intracranial abnormality. 3.  Bilateral frontal approach deep brain stimulator electrodes. The tip of the left-sided electrode is not very well visualized due to artifact but appear to be either within the ventricle or in the very medial aspect of the left thalamus.   EKG: Last EKG noted is from 03/30/2015: Normal sinus rhythm Cannot rule out Anterior infarct , age undetermined Abnormal ECG No significant change  since last tracing Confirmed by HORTON MD, COURTNEY ([id number]) on 03/30/2015 1:02:47 AM  CV: N/A  Past Medical History:  Diagnosis Date   History of kidney stones    Seizures (HCC)    daily   Sleep apnea    no CPAP    Past Surgical History:  Procedure Laterality Date   DEEP BRAIN STIMULATOR PLACEMENT     placed at [facility]   EYE SURGERY     HERNIA REPAIR     baby   stimulator battery replacement  01/2020   @[facility]    VAGUS NERVE STIMULATOR INSERTION Left 03/10/2015   Procedure: LEFT SIDED PLACEMENT VAGAL NERVE STIMULATOR IMPLANT;  Surgeon: Gerldine Maizes, MD;  Location: [facility];  Service: Neurosurgery;  Laterality: Left;    MEDICATIONS:  cenobamate  (XCOPRI ) 200 MG TABS   Cenobamate  (XCOPRI ) 50 MG TABS   cloBAZam  (ONFI ) 10 MG tablet   escitalopram  (LEXAPRO ) 10 MG tablet   Lacosamide  150 MG TABS   lamoTRIgine  (LAMICTAL ) 100 MG tablet   levETIRAcetam  (KEPPRA ) 1000 MG tablet   pregabalin  (LYRICA ) 225 MG capsule   No current facility-administered medications for this encounter.    Isaiah Ruder, PA-C Surgical Short Stay/Anesthesiology [facility] Phone [phone] [facility] Phone [phone] 02/12/2024 9:53 AM

## 2024-02-13 ENCOUNTER — Ambulatory Visit (HOSPITAL_COMMUNITY): Admitting: Vascular Surgery

## 2024-02-13 ENCOUNTER — Encounter (HOSPITAL_COMMUNITY)
Admission: RE | Disposition: A | Discharge status: Home / Self Care | Payer: Self-pay | Source: Home / Self Care | Attending: Neurosurgery

## 2024-02-13 ENCOUNTER — Encounter (HOSPITAL_COMMUNITY): Payer: Self-pay | Admitting: Neurosurgery

## 2024-02-13 ENCOUNTER — Other Ambulatory Visit: Payer: Self-pay

## 2024-02-13 ENCOUNTER — Ambulatory Visit (HOSPITAL_COMMUNITY)
Admission: RE | Admit: 2024-02-13 | Discharge: 2024-02-13 | Disposition: A | Discharge status: Home / Self Care | Attending: Neurosurgery | Admitting: Neurosurgery

## 2024-02-13 ENCOUNTER — Ambulatory Visit (HOSPITAL_COMMUNITY)

## 2024-02-13 DIAGNOSIS — Z6841 Body Mass Index (BMI) 40.0 and over, adult: Secondary | ICD-10-CM | POA: Diagnosis not present

## 2024-02-13 DIAGNOSIS — Z87442 Personal history of urinary calculi: Secondary | ICD-10-CM | POA: Diagnosis not present

## 2024-02-13 DIAGNOSIS — G4733 Obstructive sleep apnea (adult) (pediatric): Secondary | ICD-10-CM

## 2024-02-13 DIAGNOSIS — G4730 Sleep apnea, unspecified: Secondary | ICD-10-CM | POA: Insufficient documentation

## 2024-02-13 DIAGNOSIS — G40A19 Absence epileptic syndrome, intractable, without status epilepticus: Secondary | ICD-10-CM | POA: Diagnosis present

## 2024-02-13 DIAGNOSIS — G40919 Epilepsy, unspecified, intractable, without status epilepticus: Secondary | ICD-10-CM | POA: Diagnosis not present

## 2024-02-13 DIAGNOSIS — T85698A Other mechanical complication of other specified internal prosthetic devices, implants and grafts, initial encounter: Secondary | ICD-10-CM | POA: Insufficient documentation

## 2024-02-13 DIAGNOSIS — Z87891 Personal history of nicotine dependence: Secondary | ICD-10-CM | POA: Insufficient documentation

## 2024-02-13 DIAGNOSIS — X58XXXA Exposure to other specified factors, initial encounter: Secondary | ICD-10-CM | POA: Diagnosis not present

## 2024-02-13 DIAGNOSIS — E66813 Obesity, class 3: Secondary | ICD-10-CM | POA: Diagnosis not present

## 2024-02-13 SURGERY — VAGAL NERVE STIMULATOR BATTERY EXCHANGE
Anesthesia: General

## 2024-02-13 MED ORDER — CHLORHEXIDINE GLUCONATE CLOTH 2 % EX PADS: 6.0000 | MEDICATED_PAD | Freq: Once | CUTANEOUS | Status: DC

## 2024-02-13 MED ORDER — SUCCINYLCHOLINE CHLORIDE 200 MG/10ML IV SOSY
PREFILLED_SYRINGE | INTRAVENOUS | Status: DC | PRN
  Administered 2024-02-13: 160 mg via INTRAVENOUS

## 2024-02-13 MED ORDER — BUPIVACAINE HCL (PF) 0.5 % IJ SOLN
INTRAMUSCULAR | Status: AC
  Filled 2024-02-13: qty 30

## 2024-02-13 MED ORDER — BUPIVACAINE HCL 0.5 % IJ SOLN
INTRAMUSCULAR | Status: DC | PRN
  Administered 2024-02-13: 4.5 mL

## 2024-02-13 MED ORDER — LIDOCAINE-EPINEPHRINE 1 %-1:100000 IJ SOLN
INTRAMUSCULAR | Status: DC | PRN
Start: 2024-02-13 — End: 2024-02-13
  Administered 2024-02-13: 4.5 mL

## 2024-02-13 MED ORDER — ROCURONIUM BROMIDE 10 MG/ML (PF) SYRINGE
PREFILLED_SYRINGE | INTRAVENOUS | Status: DC | PRN
  Administered 2024-02-13: 10 mg via INTRAVENOUS

## 2024-02-13 MED ORDER — PROPOFOL 10 MG/ML IV BOLUS
INTRAVENOUS | Status: DC | PRN
  Administered 2024-02-13: 200 mg via INTRAVENOUS

## 2024-02-13 MED ORDER — CHLORHEXIDINE GLUCONATE 0.12 % MT SOLN
15.0000 mL | Freq: Once | OROMUCOSAL | Status: AC
  Administered 2024-02-13: 15 mL via OROMUCOSAL
  Filled 2024-02-13: qty 15

## 2024-02-13 MED ORDER — EPHEDRINE 5 MG/ML INJ
INTRAVENOUS | Status: AC
  Filled 2024-02-13: qty 10

## 2024-02-13 MED ORDER — 0.9 % SODIUM CHLORIDE (POUR BTL) OPTIME
TOPICAL | Status: DC | PRN
  Administered 2024-02-13: 1000 mL

## 2024-02-13 MED ORDER — OXYCODONE HCL 5 MG PO TABS: 5.0000 mg | ORAL_TABLET | Freq: Once | ORAL | Status: DC | PRN

## 2024-02-13 MED ORDER — FENTANYL CITRATE (PF) 100 MCG/2ML IJ SOLN
INTRAMUSCULAR | Status: DC | PRN
  Administered 2024-02-13: 100 ug via INTRAVENOUS

## 2024-02-13 MED ORDER — VANCOMYCIN HCL 1500 MG/300ML IV SOLN
1500.0000 mg | INTRAVENOUS | Status: AC
  Administered 2024-02-13: 1500 mg via INTRAVENOUS
  Filled 2024-02-13 (×2): qty 300

## 2024-02-13 MED ORDER — EPHEDRINE SULFATE-NACL 50-0.9 MG/10ML-% IV SOSY
PREFILLED_SYRINGE | INTRAVENOUS | Status: DC | PRN
  Administered 2024-02-13: 10 mg via INTRAVENOUS
  Administered 2024-02-13: 5 mg via INTRAVENOUS
  Administered 2024-02-13 (×3): 10 mg via INTRAVENOUS

## 2024-02-13 MED ORDER — ACETAMINOPHEN 500 MG PO TABS
1000.0000 mg | ORAL_TABLET | Freq: Once | ORAL | Status: AC
  Administered 2024-02-13: 1000 mg via ORAL
  Filled 2024-02-13: qty 2

## 2024-02-13 MED ORDER — HYDROCODONE-ACETAMINOPHEN 5-325 MG PO TABS: 1.0000 | ORAL_TABLET | Freq: Four times a day (QID) | ORAL | 0 refills | Status: AC | PRN

## 2024-02-13 MED ORDER — ORAL CARE MOUTH RINSE: 15.0000 mL | Freq: Once | OROMUCOSAL | Status: AC

## 2024-02-13 MED ORDER — LIDOCAINE 2% (20 MG/ML) 5 ML SYRINGE
INTRAMUSCULAR | Status: AC
Start: 2024-02-13 — End: 2024-02-13
  Filled 2024-02-13: qty 5

## 2024-02-13 MED ORDER — LACTATED RINGERS IV SOLN: INTRAVENOUS | Status: DC

## 2024-02-13 MED ORDER — PROPOFOL 10 MG/ML IV BOLUS
INTRAVENOUS | Status: AC
Start: 2024-02-13 — End: 2024-02-13
  Filled 2024-02-13: qty 20

## 2024-02-13 MED ORDER — LIDOCAINE-EPINEPHRINE 1 %-1:100000 IJ SOLN
INTRAMUSCULAR | Status: AC
  Filled 2024-02-13: qty 1

## 2024-02-13 MED ORDER — MEPERIDINE HCL 25 MG/ML IJ SOLN: 6.2500 mg | INTRAMUSCULAR | Status: DC | PRN

## 2024-02-13 MED ORDER — ONDANSETRON HCL 4 MG/2ML IJ SOLN: 4.0000 mg | Freq: Once | INTRAMUSCULAR | Status: DC | PRN

## 2024-02-13 MED ORDER — CELECOXIB 200 MG PO CAPS
200.0000 mg | ORAL_CAPSULE | Freq: Once | ORAL | Status: AC
  Administered 2024-02-13: 200 mg via ORAL
  Filled 2024-02-13: qty 1

## 2024-02-13 MED ORDER — ONDANSETRON HCL 4 MG/2ML IJ SOLN
INTRAMUSCULAR | Status: AC
  Filled 2024-02-13: qty 2

## 2024-02-13 MED ORDER — MIDAZOLAM HCL (PF) 2 MG/2ML IJ SOLN
INTRAMUSCULAR | Status: DC | PRN
  Administered 2024-02-13: 2 mg via INTRAVENOUS

## 2024-02-13 MED ORDER — PHENYLEPHRINE 80 MCG/ML (10ML) SYRINGE FOR IV PUSH (FOR BLOOD PRESSURE SUPPORT)
PREFILLED_SYRINGE | INTRAVENOUS | Status: AC
  Filled 2024-02-13: qty 10

## 2024-02-13 MED ORDER — MIDAZOLAM HCL 2 MG/2ML IJ SOLN
INTRAMUSCULAR | Status: AC
Start: 2024-02-13 — End: 2024-02-13
  Filled 2024-02-13: qty 2

## 2024-02-13 MED ORDER — THROMBIN 5000 UNITS EX KIT
PACK | CUTANEOUS | Status: AC
  Filled 2024-02-13: qty 1

## 2024-02-13 MED ORDER — LIDOCAINE 2% (20 MG/ML) 5 ML SYRINGE
INTRAMUSCULAR | Status: DC | PRN
  Administered 2024-02-13: 60 mg via INTRAVENOUS

## 2024-02-13 MED ORDER — FENTANYL CITRATE (PF) 100 MCG/2ML IJ SOLN: 25.0000 ug | INTRAMUSCULAR | Status: DC | PRN

## 2024-02-13 MED ORDER — DEXAMETHASONE SOD PHOSPHATE PF 10 MG/ML IJ SOLN
INTRAMUSCULAR | Status: DC | PRN
Start: 2024-02-13 — End: 2024-02-13
  Administered 2024-02-13: 10 mg via INTRAVENOUS

## 2024-02-13 MED ORDER — PHENYLEPHRINE HCL (PRESSORS) 10 MG/ML IV SOLN
INTRAVENOUS | Status: DC | PRN
  Administered 2024-02-13: 200 ug via INTRAVENOUS
  Administered 2024-02-13 (×2): 300 ug via INTRAVENOUS
  Administered 2024-02-13: 200 ug via INTRAVENOUS

## 2024-02-13 MED ORDER — OXYCODONE HCL 5 MG/5ML PO SOLN: 5.0000 mg | Freq: Once | ORAL | Status: DC | PRN

## 2024-02-13 MED ORDER — ONDANSETRON HCL 4 MG/2ML IJ SOLN
INTRAMUSCULAR | Status: DC | PRN
  Administered 2024-02-13: 4 mg via INTRAVENOUS

## 2024-02-13 MED ORDER — FENTANYL CITRATE (PF) 100 MCG/2ML IJ SOLN
INTRAMUSCULAR | Status: AC
  Filled 2024-02-13: qty 2

## 2024-02-13 SURGICAL SUPPLY — 32 items
BAG COUNTER SPONGE SURGICOUNT (BAG) ×2 IMPLANT
BENZOIN TINCTURE PRP APPL 2/3 (GAUZE/BANDAGES/DRESSINGS) IMPLANT
BLADE CLIPPER SURG (BLADE) IMPLANT
CANISTER SUCTION 3000ML PPV (SUCTIONS) ×2 IMPLANT
DERMABOND ADVANCED .7 DNX12 (GAUZE/BANDAGES/DRESSINGS) ×2 IMPLANT
DRAPE CAMERA VIDEO/LASER (DRAPES) ×2 IMPLANT
DRAPE LAPAROTOMY 100X72 PEDS (DRAPES) ×2 IMPLANT
DRSG OPSITE POSTOP 4X6 (GAUZE/BANDAGES/DRESSINGS) IMPLANT
DURAPREP 6ML APPLICATOR 50/CS (WOUND CARE) ×2 IMPLANT
ELECTRODE REM PT RTRN 9FT ADLT (ELECTROSURGICAL) ×2 IMPLANT
GENERATOR M1000 SENTIVA (Generator) IMPLANT
GLOVE BIOGEL PI IND STRL 7.5 (GLOVE) ×6 IMPLANT
GLOVE ECLIPSE 7.0 STRL STRAW (GLOVE) ×2 IMPLANT
GOWN STRL REUS W/ TWL LRG LVL3 (GOWN DISPOSABLE) ×4 IMPLANT
GOWN STRL REUS W/ TWL XL LVL3 (GOWN DISPOSABLE) IMPLANT
GOWN STRL REUS W/TWL 2XL LVL3 (GOWN DISPOSABLE) IMPLANT
KIT BASIN OR (CUSTOM PROCEDURE TRAY) ×2 IMPLANT
KIT TURNOVER KIT B (KITS) ×2 IMPLANT
NDL HYPO 22X1.5 SAFETY MO (MISCELLANEOUS) ×2 IMPLANT
NEEDLE HYPO 22X1.5 SAFETY MO (MISCELLANEOUS) ×1 IMPLANT
PACK LAMINECTOMY NEURO (CUSTOM PROCEDURE TRAY) ×2 IMPLANT
PAD ARMBOARD POSITIONER FOAM (MISCELLANEOUS) ×6 IMPLANT
SOLN 0.9% NACL POUR BTL 1000ML (IV SOLUTION) ×2 IMPLANT
SOLN STERILE WATER BTL 1000 ML (IV SOLUTION) ×2 IMPLANT
SPIKE FLUID TRANSFER (MISCELLANEOUS) ×2 IMPLANT
SPONGE INTESTINAL PEANUT (DISPOSABLE) IMPLANT
SPONGE SURGIFOAM ABS GEL SZ50 (HEMOSTASIS) IMPLANT
SUT 3-0 BLK 1X30 PSL (SUTURE) IMPLANT
SUT VIC AB 3-0 SH 8-18 (SUTURE) ×2 IMPLANT
SUT VICRYL 3-0 RB1 18 ABS (SUTURE) ×4 IMPLANT
TOWEL GREEN STERILE (TOWEL DISPOSABLE) ×2 IMPLANT
TOWEL GREEN STERILE FF (TOWEL DISPOSABLE) ×2 IMPLANT

## 2024-02-13 NOTE — Transfer of Care (Signed)
 Immediate Anesthesia Transfer of Care Note  Patient: Gregg Young  Procedure(s) Performed: VAGAL NERVE STIMULATOR BATTERY EXCHANGE  Patient Location: PACU  Anesthesia Type:General  Level of Consciousness: awake, alert , and oriented  Airway & Oxygen Therapy: Patient Spontanous Breathing and Patient connected to face mask oxygen  Post-op Assessment: Report given to RN and Patient moving all extremities  Post vital signs: Reviewed and stable  Last Vitals:  Vitals Value Taken Time  BP 133/79 02/13/24 14:00  Temp 36.5 C 02/13/24 13:54  Pulse 91 02/13/24 14:02  Resp 14 02/13/24 14:02  SpO2 91 % 02/13/24 14:02  Vitals shown include unfiled device data.  Last Pain:  Vitals:   02/13/24 1354  TempSrc:   PainSc: 0-No pain         Complications: No notable events documented.

## 2024-02-13 NOTE — Anesthesia Postprocedure Evaluation (Signed)
 Anesthesia Post Note  Patient: Gregg Young  Procedure(s) Performed: VAGAL NERVE STIMULATOR BATTERY EXCHANGE     Patient location during evaluation: PACU Anesthesia Type: General Level of consciousness: awake and alert Pain management: pain level controlled Vital Signs Assessment: post-procedure vital signs reviewed and stable Respiratory status: spontaneous breathing, nonlabored ventilation, respiratory function stable and patient connected to nasal cannula oxygen Cardiovascular status: blood pressure returned to baseline and stable Postop Assessment: no apparent nausea or vomiting Anesthetic complications: no   No notable events documented.  Last Vitals:  Vitals:   02/13/24 1400 02/13/24 1415  BP: 133/79 115/62  Pulse: 98 95  Resp: 16 20  Temp:    SpO2: (!) 89% 90%    Last Pain:  Vitals:   02/13/24 1415  TempSrc:   PainSc: 0-No pain                 Peyton Spengler

## 2024-02-13 NOTE — H&P (Signed)
 Chief Complaint   VNS battery end-of-life  History of Present Illness  Gregg Young is a 48 y.o. male with a history of epilepsy who previously underwent placement of a VNS. Recent interrogation has revealed end-of-life of the battery. He was therefore referred for battery change.  Past Medical History   Past Medical History:  Diagnosis Date   History of kidney stones    Seizures (HCC)    daily   Sleep apnea    no CPAP    Past Surgical History   Past Surgical History:  Procedure Laterality Date   DEEP BRAIN STIMULATOR PLACEMENT     placed at [facility]   EYE SURGERY     HERNIA REPAIR     baby   stimulator battery replacement  01/2020   @[facility]    VAGUS NERVE STIMULATOR INSERTION Left 03/10/2015   Procedure: LEFT SIDED PLACEMENT VAGAL NERVE STIMULATOR IMPLANT;  Surgeon: Gerldine Maizes, MD;  Location: [facility];  Service: Neurosurgery;  Laterality: Left;    Social History   Social History   Tobacco Use   Smoking status: Former    Current packs/day: 0.00    Average packs/day: 0.5 packs/day for 4.0 years (2.0 ttl pk-yrs)    Types: Cigarettes    Start date: 03/03/1991    Quit date: 03/03/1995    Years since quitting: 28.9   Smokeless tobacco: Never  Vaping Use   Vaping status: Never Used  Substance Use Topics   Alcohol use: No    Alcohol/week: 0.0 standard drinks of alcohol   Drug use: No    Medications   Prior to Admission medications   Medication Sig Start Date End Date Taking? Authorizing Provider  cenobamate  (XCOPRI ) 200 MG TABS Take 1 tablet daily 11/14/23  Yes Georjean Darice HERO, MD  cloBAZam  (ONFI ) 10 MG tablet Take 1 tablet twice a day 01/05/24  Yes Georjean Darice HERO, MD  escitalopram  (LEXAPRO ) 10 MG tablet TAKE 1 TABLET BY MOUTH EVERY DAY 09/04/23  Yes Georjean Darice HERO, MD  Lacosamide  150 MG TABS Take 1 tablet (150 mg total) by mouth 2 (two) times daily. 01/05/24  Yes Georjean Darice HERO, MD  lamoTRIgine  (LAMICTAL ) 100 MG tablet TAKE 2 TABLETS BY MOUTH  3 TIMES DAILY 09/04/23  Yes Georjean Darice HERO, MD  levETIRAcetam  (KEPPRA ) 1000 MG tablet TAKE 2 TABLETS BY MOUTH 2 TIMES DAILY 09/04/23  Yes Georjean Darice HERO, MD  pregabalin  (LYRICA ) 225 MG capsule TAKE 1 CAPSULE BY MOUTH 3 TIMES DAILY 01/05/24  Yes Georjean Darice HERO, MD  Cenobamate  (XCOPRI ) 50 MG TABS Take 1 tablet daily (Take with 150mg  tablet for total of 200mg  daily) Patient not taking: Reported on 02/08/2024 11/15/23   Georjean Darice HERO, MD    Allergies   Allergies  Allergen Reactions   Penicillins Rash    Has patient had a PCN reaction causing immediate rash, facial/tongue/throat swelling, SOB or lightheadedness with hypotension: No Has patient had a PCN reaction causing severe rash involving mucus membranes or skin necrosis: No Has patient had a PCN reaction that required hospitalization No Has patient had a PCN reaction occurring within the last 10 years: No If all of the above answers are NO, then may proceed with Cephalosporin use.     Review of Systems  ROS  Neurologic Exam  Awake, alert, oriented Memory and concentration grossly intact Speech fluent, appropriate CN grossly intact Motor exam: Upper Extremities Deltoid Bicep Tricep Grip  Right 5/5 5/5 5/5 5/5  Left 5/5 5/5 5/5  5/5   Lower Extremities IP Quad PF DF EHL  Right 5/5 5/5 5/5 5/5 5/5  Left 5/5 5/5 5/5 5/5 5/5   Sensation grossly intact to LT  Impression  - 48 y.o. male with medically intractable epilepsy and end-of-life of the previously placed VNS battery.  Plan  - Will proceed with VNS battery change.  I have reviewed the indications for the procedure as well as the details of the procedure and the expected postoperative course and recovery at length with the patient and family in the office. We have also reviewed in detail the risks, benefits, and alternatives to the procedure. All questions were answered and Gregg Young provided informed consent to proceed.  Gerldine Maizes, MD [facility]

## 2024-02-13 NOTE — Anesthesia Procedure Notes (Signed)
 Procedure Name: Intubation Date/Time: 02/13/2024 1:12 PM  Performed by: Elby Raelene SAUNDERS, CRNAPre-anesthesia Checklist: Patient identified, Emergency Drugs available, Suction available and Patient being monitored Patient Re-evaluated:Patient Re-evaluated prior to induction Oxygen Delivery Method: Circle system utilized Preoxygenation: Pre-oxygenation with 100% oxygen Induction Type: IV induction Ventilation: Mask ventilation without difficulty Laryngoscope Size: Mac and 4 Grade View: Grade I Tube type: Oral Tube size: 7.5 mm Number of attempts: 1 Airway Equipment and Method: Stylet and Oral airway Placement Confirmation: ETT inserted through vocal cords under direct vision, positive ETCO2 and breath sounds checked- equal and bilateral Secured at: 23 cm Tube secured with: Tape Dental Injury: Teeth and Oropharynx as per pre-operative assessment

## 2024-02-13 NOTE — Discharge Summary (Signed)
 Physician Discharge Summary  Patient ID: Gregg Young MRN: [id number] DOB/AGE: [birth year] 48 y.o.  Admit date: 02/13/2024 Discharge date: 02/13/2024  Admission Diagnoses:  Medically intractable epilepsy, VNS battery end-of-life  Discharge Diagnoses:  Same Active Problems:   * No active hospital problems. *   Discharged Condition: Stable  Hospital Course:  Gregg Young is a 48 y.o. male who underwent uncomplicated replacement of a VNS battery. He was at baseline postop and discharged home from PACU in stable condition.  Treatments: Surgery - Replacement of VNS battery  Discharge Exam: Blood pressure 129/83, pulse 79, temperature 98.3 F (36.8 C), temperature source Oral, resp. rate 19, height 5' 6 (1.676 m), weight 122.5 kg, SpO2 91%. Awake, alert, oriented Speech fluent, appropriate CN grossly intact 5/5 BUE/BLE Wound c/d/i  Disposition: Discharge disposition: 01-Home or Self Care       Discharge Instructions     Call MD for:  redness, tenderness, or signs of infection (pain, swelling, redness, odor or green/yellow discharge around incision site)   Complete by: As directed    Call MD for:  temperature >100.4   Complete by: As directed    Diet - low sodium heart healthy   Complete by: As directed    Discharge instructions   Complete by: As directed    Walk at home as much as possible, at least 4 times / day   Increase activity slowly   Complete by: As directed    Lifting restrictions   Complete by: As directed    No lifting > 10 lbs   May shower / Bathe   Complete by: As directed    48 hours after surgery   May walk up steps   Complete by: As directed    Other Restrictions   Complete by: As directed    No bending/twisting at waist   Remove dressing in 48 hours   Complete by: As directed       Allergies as of 02/13/2024       Reactions   Penicillins Rash   Has patient had a PCN reaction causing immediate rash, facial/tongue/throat  swelling, SOB or lightheadedness with hypotension: No Has patient had a PCN reaction causing severe rash involving mucus membranes or skin necrosis: No Has patient had a PCN reaction that required hospitalization No Has patient had a PCN reaction occurring within the last 10 years: No If all of the above answers are NO, then may proceed with Cephalosporin use.        Medication List     TAKE these medications    cloBAZam  10 MG tablet Commonly known as: ONFI  Take 1 tablet twice a day   escitalopram  10 MG tablet Commonly known as: LEXAPRO  TAKE 1 TABLET BY MOUTH EVERY DAY   HYDROcodone-acetaminophen  5-325 MG tablet Commonly known as: NORCO/VICODIN Take 1 tablet by mouth every 6 (six) hours as needed for up to 3 days.   Lacosamide  150 MG Tabs Take 1 tablet (150 mg total) by mouth 2 (two) times daily.   lamoTRIgine  100 MG tablet Commonly known as: LAMICTAL  TAKE 2 TABLETS BY MOUTH 3 TIMES DAILY   levETIRAcetam  1000 MG tablet Commonly known as: KEPPRA  TAKE 2 TABLETS BY MOUTH 2 TIMES DAILY   pregabalin  225 MG capsule Commonly known as: LYRICA  TAKE 1 CAPSULE BY MOUTH 3 TIMES DAILY   Xcopri  200 MG Tabs Generic drug: cenobamate  Take 1 tablet daily What changed: Another medication with the same name was removed. Continue taking this medication, and  follow the directions you see here.        Follow-up Information     Lanis Pupa, MD Follow up in 2 week(s).   Specialty: Neurosurgery Contact information: [address] [place] [address] [phone]                 Signed: Pupa JAYSON Lanis 02/13/2024, 12:40 PM

## 2024-02-14 NOTE — Op Note (Signed)
  NEUROSURGERY OPERATIVE NOTE   PREOP DIAGNOSIS:  VNS battery end-of-life Medically intractable epilepsy   POSTOP DIAGNOSIS: Same  PROCEDURE: 1. Replacement of left Vagal nerve stimulator battery  SURGEON: Dr. Gerldine Maizes, MD  ASSISTANT: None  ANESTHESIA: General Endotracheal  EBL: Minimal  SPECIMENS: None  DRAINS: None  COMPLICATIONS: None immediate  CONDITION: Hemodynamically stable to PACU  HISTORY: Gregg Young is a 48 y.o. who was initially seen in the outpatient clinic who previously underwent placement of a VNS several years ago. Recent interrogation revealed end-of-life of the battery. The risks and benefits of the surgery for battery replacement were reviewed in detail. After all questions were answered, informed consent was obtained.  PROCEDURE IN DETAIL: After informed consent was obtained and witnessed, the patient was brought to the operating room. After induction of general anesthesia, the patient was positioned on the operative table in the supine position. All pressure points were meticulously padded. Left infraclavicular skin incision was then marked out and prepped and draped in the usual sterile fashion.  After timeout was conducted, skin incision was infiltrated with local anesthetic with epinephrine . Skin incision was then made sharply, the subcutaneous pocket was opened and the generator identified. This was explanted. The lead was removed and the new generator connected and replaced in the subcutaneous pocket. The new generator was interrogated and noted to have normal lead impedence, accurate hear rate detection. As the previous battery was completely discharged, we left the generator off. Wound was then irrigated with saline and closed with interrupted 3-0 Vicryl stitches. Skin was closed with dermabond.  At the end of the case all sponge, needle, cottonoid, and instrument counts were correct. The patient was then extubated, transferred to the  stretcher, and taken to the postanesthesia care unit in stable hemodynamic condition.   Gerldine Maizes, MD [facility]

## 2024-03-05 ENCOUNTER — Ambulatory Visit: Payer: 59

## 2024-03-05 VITALS — BP 124/70 | Ht 66.0 in | Wt 270.0 lb

## 2024-03-05 DIAGNOSIS — Z0000 Encounter for general adult medical examination without abnormal findings: Secondary | ICD-10-CM

## 2024-03-05 NOTE — Progress Notes (Signed)
 Chief Complaint  Patient presents with   Medicare Wellness     Subjective:   Gregg Young is a 48 y.o. male who presents for a Medicare Annual Wellness Visit.  Visit info / Clinical Intake: Medicare Wellness Visit Type:: Subsequent Annual Wellness Visit Persons participating in visit and providing information:: patient; patient & caregiver Medicare Wellness Visit Mode:: Telephone If telephone:: video declined Since this visit was completed virtually, some vitals may be partially provided or unavailable. Missing vitals are due to the limitations of the virtual format.: Documented vitals are patient reported If Telephone or Video please confirm:: I connected with patient using audio/video enable telemedicine. I verified patient identity with two identifiers, discussed telehealth limitations, and patient agreed to proceed. Patient Location:: home Provider Location:: home office Pre-visit prep was completed: yes AWV questionnaire completed by patient prior to visit?: yes Date:: 03/04/24 Living arrangements:: (Proxy-Rptd) with family/others Patient's Overall Health Status Rating: (!) (Proxy-Rptd) fair Typical amount of pain: (Proxy-Rptd) some Does pain affect daily life?: (!) (Proxy-Rptd) yes Are you currently prescribed opioids?: no  Dietary Habits and Nutritional Risks How many meals a day?: (Proxy-Rptd) 2 Eats fruit and vegetables daily?: (Proxy-Rptd) yes Most meals are obtained by: (Proxy-Rptd) having others provide food In the last 2 weeks, have you had any of the following?: none Diabetic:: no  Functional Status Activities of Daily Living (to include ambulation/medication): (Proxy-Rptd) Independent Ambulation: Independent Medication Administration: Needs assistance (comment) Is this a change from baseline?: Pre-admission baseline Home Management (perform basic housework or laundry): Needs assistance (comment) Manage your own finances?: (Proxy-Rptd) yes Primary  transportation is: (Proxy-Rptd) family / friends Concerns about hearing?: no  Fall Screening Falls in the past year?: (Proxy-Rptd) 1 Number of falls in past year: (Proxy-Rptd) 1 Was there an injury with Fall?: (Proxy-Rptd) 0 Fall Risk Category Calculator: (Proxy-Rptd) 2 Patient Fall Risk Level: (Proxy-Rptd) Moderate Fall Risk  Fall Risk Patient at Risk for Falls Due to: History of fall(s); Other (Comment) (during seizure) Fall risk Follow up: Falls prevention discussed  Home and Transportation Safety: All rugs have non-skid backing?: (Proxy-Rptd) yes All stairs or steps have railings?: (Proxy-Rptd) N/A, no stairs Grab bars in the bathtub or shower?: (!) (Proxy-Rptd) no Have non-skid surface in bathtub or shower?: (Proxy-Rptd) yes Good home lighting?: (Proxy-Rptd) yes Regular seat belt use?: (Proxy-Rptd) yes Hospital stays in the last year:: (!) (Proxy-Rptd) yes How many hospital stays:: (Proxy-Rptd) 1  Cognitive Assessment Difficulty concentrating, remembering, or making decisions? : (Proxy-Rptd) no Will 6CIT or Mini Cog be Completed: yes What year is it?: 0 points What month is it?: 0 points Give patient an address phrase to remember (5 components): [address] [place]  About what time is it?: 0 points Count backwards from 20 to 1: 0 points Say the months of the year in reverse: 0 points Repeat the address phrase from earlier: 2 points 6 CIT Score: 2 points  Advance Directives (For Healthcare) Does Patient Have a Medical Advance Directive?: Yes Does patient want to make changes to medical advance directive?: No - Patient declined Type of Advance Directive: Healthcare Power of Attorney Copy of Healthcare Power of Attorney in Chart?: No - copy requested  Reviewed/Updated  Reviewed/Updated: Reviewed All (Medical, Surgical, Family, Medications, Allergies, Care Teams, Patient Goals)    Allergies (verified) Penicillins   Current Medications (verified) Outpatient Encounter  Medications as of 03/05/2024  Medication Sig   cenobamate  (XCOPRI ) 200 MG TABS Take 1 tablet daily   cloBAZam  (ONFI ) 10 MG tablet Take 1  tablet twice a day   escitalopram  (LEXAPRO ) 10 MG tablet TAKE 1 TABLET BY MOUTH EVERY DAY   Lacosamide  150 MG TABS Take 1 tablet (150 mg total) by mouth 2 (two) times daily.   lamoTRIgine  (LAMICTAL ) 100 MG tablet TAKE 2 TABLETS BY MOUTH 3 TIMES DAILY   levETIRAcetam  (KEPPRA ) 1000 MG tablet TAKE 2 TABLETS BY MOUTH 2 TIMES DAILY   pregabalin  (LYRICA ) 225 MG capsule TAKE 1 CAPSULE BY MOUTH 3 TIMES DAILY   No facility-administered encounter medications on file as of 03/05/2024.    History: Past Medical History:  Diagnosis Date   History of kidney stones    Seizures (HCC)    daily   Sleep apnea    no CPAP   Past Surgical History:  Procedure Laterality Date   DEEP BRAIN STIMULATOR PLACEMENT     placed at [facility]   EYE SURGERY     HERNIA REPAIR     baby   stimulator battery replacement  01/2020   @[facility]    VAGUS NERVE STIMULATOR INSERTION Left 03/10/2015   Procedure: LEFT SIDED PLACEMENT VAGAL NERVE STIMULATOR IMPLANT;  Surgeon: Gerldine Maizes, MD;  Location: [facility];  Service: Neurosurgery;  Laterality: Left;   Family History  Problem Relation Age of Onset   Healthy Mother    Healthy Father    Social History   Occupational History   Not on file  Tobacco Use   Smoking status: Former    Current packs/day: 0.00    Average packs/day: 0.5 packs/day for 4.0 years (2.0 ttl pk-yrs)    Types: Cigarettes    Start date: 03/03/1991    Quit date: 03/03/1995    Years since quitting: 29.0   Smokeless tobacco: Never  Vaping Use   Vaping status: Never Used  Substance and Sexual Activity   Alcohol use: No    Alcohol/week: 0.0 standard drinks of alcohol   Drug use: No   Sexual activity: Not on file   Tobacco Counseling Counseling given: Not Answered  SDOH Screenings   Food Insecurity: No Food Insecurity (03/04/2024)  Housing: Low  Risk  (03/04/2024)  Transportation Needs: No Transportation Needs (03/04/2024)  Utilities: Not At Risk (03/05/2024)  Depression (PHQ2-9): Low Risk  (03/05/2024)  Financial Resource Strain: Low Risk  (03/04/2024)  Physical Activity: Insufficiently Active (03/04/2024)  Social Connections: Moderately Isolated (03/04/2024)  Stress: No Stress Concern Present (03/04/2024)  Tobacco Use: Medium Risk (03/05/2024)  Health Literacy: Adequate Health Literacy (03/05/2024)   See flowsheets for full screening details  Depression Screen PHQ 2 & 9 Depression Scale- Over the past 2 weeks, how often have you been bothered by any of the following problems? Little interest or pleasure in doing things: 0 Feeling down, depressed, or hopeless (PHQ Adolescent also includes...irritable): 0 PHQ-2 Total Score: 0     Goals Addressed               This Visit's Progress     Weight (lb) < 200 lb (90.7 kg) (pt-stated)   270 lb (122.5 kg)     Weight lost             Objective:    Today's Vitals   03/05/24 1502  BP: 124/70  Weight: 270 lb (122.5 kg)  Height: 5' 6 (1.676 m)   Body mass index is 43.58 kg/m.  Hearing/Vision screen Hearing Screening - Comments:: Pt denies any hearing issues  Vision Screening - Comments:: Wears rx glasses - up to date with routine eye exams with  Dr Charmayne  Immunizations and Health Maintenance Health Maintenance  Topic Date Due   Hepatitis B Vaccines 19-59 Average Risk (1 of 3 - 19+ 3-dose series) Never done   COVID-19 Vaccine (7 - 2025-26 season) 12/04/2023   Medicare Annual Wellness (AWV)  03/05/2025   Fecal DNA (Cologuard)  08/21/2025   DTaP/Tdap/Td (3 - Td or Tdap) 04/10/2032   Influenza Vaccine  Completed   Hepatitis C Screening  Completed   HIV Screening  Completed   Pneumococcal Vaccine  Aged Out   HPV VACCINES  Aged Out   Meningococcal B Vaccine  Aged Out        Assessment/Plan:  This is a routine wellness examination for Gregg Young.  Patient Care  Team: Katrinka Garnette KIDD, MD as PCP - General (Family Medicine) Nivia Charis Harry, MD as Referring Physician (Neurology) Drinda Carliss Idol, MD as Consulting Physician (Neurosurgery) Georjean Darice HERO, MD as Consulting Physician (Neurology)  I have personally reviewed and noted the following in the patient's chart:   Medical and social history Use of alcohol, tobacco or illicit drugs  Current medications and supplements including opioid prescriptions. Functional ability and status Nutritional status Physical activity Advanced directives List of other physicians Hospitalizations, surgeries, and ER visits in previous 12 months Vitals Screenings to include cognitive, depression, and falls Referrals and appointments  No orders of the defined types were placed in this encounter.  In addition, I have reviewed and discussed with patient certain preventive protocols, quality metrics, and best practice recommendations. A written personalized care plan for preventive services as well as general preventive health recommendations were provided to patient.   Ellouise VEAR Haws, LPN   [id number]   Return in 1 year (on 03/05/2025).  After Visit Summary: (MyChart) Due to this being a telephonic visit, the after visit summary with patients personalized plan was offered to patient via MyChart   Nurse Notes: none

## 2024-03-05 NOTE — Patient Instructions (Signed)
 Gregg Young,  Thank you for taking the time for your Medicare Wellness Visit. I appreciate your continued commitment to your health goals. Please review the care plan we discussed, and feel free to reach out if I can assist you further.  Please note that Annual Wellness Visits do not include a physical exam. Some assessments may be limited, especially if the visit was conducted virtually. If needed, we may recommend an in-person follow-up with your provider.  Ongoing Care Seeing your primary care provider every 3 to 6 months helps us  monitor your health and provide consistent, personalized care.   Referrals If a referral was made during today's visit and you haven't received any updates within two weeks, please contact the referred provider directly to check on the status.  Recommended Screenings:  Health Maintenance  Topic Date Due   Hepatitis B Vaccine (1 of 3 - 19+ 3-dose series) Never done   Flu Shot  11/03/2023   COVID-19 Vaccine (7 - 2025-26 season) 12/04/2023   Medicare Annual Wellness Visit  03/05/2025   Cologuard (Stool DNA test)  08/21/2025   DTaP/Tdap/Td vaccine (3 - Td or Tdap) 04/10/2032   Hepatitis C Screening  Completed   HIV Screening  Completed   Pneumococcal Vaccine  Aged Out   HPV Vaccine  Aged Out   Meningitis B Vaccine  Aged Out       03/04/2024   10:15 PM  Advanced Directives  Does Patient Have a Medical Advance Directive? Yes  Type of Advance Directive Healthcare Power of Attorney  Copy of Healthcare Power of Attorney in Chart? No - copy requested    Vision: Annual vision screenings are recommended for early detection of glaucoma, cataracts, and diabetic retinopathy. These exams can also reveal signs of chronic conditions such as diabetes and high blood pressure.  Dental: Annual dental screenings help detect early signs of oral cancer, gum disease, and other conditions linked to overall health, including heart disease and diabetes.  Please see the  attached documents for additional preventive care recommendations.

## 2024-03-11 ENCOUNTER — Ambulatory Visit: Admitting: Neurology

## 2024-03-12 ENCOUNTER — Ambulatory Visit: Admitting: Neurology

## 2024-03-12 ENCOUNTER — Encounter: Payer: Self-pay | Admitting: Neurology

## 2024-03-12 VITALS — BP 108/74 | HR 75 | Ht 66.0 in | Wt 282.0 lb

## 2024-03-12 DIAGNOSIS — G40211 Localization-related (focal) (partial) symptomatic epilepsy and epileptic syndromes with complex partial seizures, intractable, with status epilepticus: Secondary | ICD-10-CM

## 2024-03-12 NOTE — Patient Instructions (Signed)
 It's always good to see you! Continue all your medications. Follow-up in March, call for any changes.    Seizure Precautions: 1. If medication has been prescribed for you to prevent seizures, take it exactly as directed.  Do not stop taking the medicine without talking to your doctor first, even if you have not had a seizure in a long time.   2. Avoid activities in which a seizure would cause danger to yourself or to others.  Don't operate dangerous machinery, swim alone, or climb in high or dangerous places, such as on ladders, roofs, or girders.  Do not drive unless your doctor says you may.  3. If you have any warning that you may have a seizure, lay down in a safe place where you can't hurt yourself.    4.  No driving for 6 months from last seizure, as per   state law.   Please refer to the following link on the [employer]'s website for more information: http://www.epilepsyfoundation.org/answerplace/Social/driving/drivingu.cfm   5.  Maintain good sleep hygiene.  6.  Contact your doctor if you have any problems that may be related to the medicine you are taking.  7.  Call 911 and bring the patient back to the ED if:        A.  The seizure lasts longer than 5 minutes.       B.  The patient doesn't awaken shortly after the seizure  C.  The patient has new problems such as difficulty seeing, speaking or moving  D.  The patient was injured during the seizure  E.  The patient has a temperature over 102 F (39C)  F.  The patient vomited and now is having trouble breathing

## 2024-03-12 NOTE — Progress Notes (Signed)
 NEUROLOGY FOLLOW UP OFFICE NOTE  Gregg Young [id number] [birth year]  HISTORY OF PRESENT ILLNESS: I had the pleasure of seeing Gregg Young in follow-up in the neurology clinic on 12/49/2025.  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 He is again accompanied by his parents who help supplement the history today.  Records and images were personally reviewed where available.  On his last visit, they continued to report 5-10 seizures daily. Xcopri  was increased to 200mg  daily, he continues on Lacosamide  150mg  BID, clobazam  10mg  qhs, Levetiracetam  2000mg  BID, Lamotrigine  200mg  TID, and Pregabalin  225mg  TID. His mother reported 3 falls in September due to seizures, after having been fall-free for over a year. His VNS battery was at end of life, we discussed the possibility that VNS was helping, since at one point in 2024 he was down to 1-2 brief seizures daily. VNS battery was replaced on 02/13/24, he is here for initial VNS setting adjustment.   He continues to have 5-10 brief seizures daily, usually between 6-9pm. His father reports dizziness, but they all state that he takes his medications without eating anything, and once he eats, he feels better. He has not had any falls since September.. He denies any headaches, double vision, focal numbness/tingling/weakness. VNS incision healing well. He is waking up earlier than before and does not sleep into the morning like before.    Seizure History: This is a [name] with a history of seizures since age 10. He has no recollection of events, no prior warning, witnessed by his mother to have a generalized convulsion lasting 90 to 120 seconds. He was brought to [facility] then had another convulsion 49 days later. They recall trying different medications, Depakote caused liver dysfunction, he has failed Dilantin and Zonegran . He has been on Keppra , Lamictal , Lyrica , and most recently Vimpat .  He had an EMU  admission at [facility], records unavailable for review, per notes he stayed for 36 hours and had generalized seizures, multiple foci.  They report two admissions for status epilepticus, one in September 48 in the setting of weaning off Depakote, and another in January 48.  Records unavailable for review.  His last GTC was around 48 years ago. He continued to have petit mals several times a day where his eyes would roll back, hands would shake for 30-45 seconds, if standing he would fall and had required sutures and staples in the past. He had been having the petit mals several times daily until 3 weeks ago when he started having a different type of episode and the petit mals completely stopped.  He was brought to the ER on 12/07/13 when his parents awoke to him dry having and speaking gibberish. He would be unable to speak, control his limbs, and cannot stand up without assistance. He can hear people around him but his jaw feels tight. The episodes can last for several hours. He has had 4 episodes in the past 49 weeks.  The patient reports that they have been occurring only during the weekends, however his parents remind him he had one on Wednesday. He went to his neurologist's office on 09/10 where he had an episode while walking to the exam room where he became dizzy and fell.  His father was able to catch him, and they reported his speech became affected. He was noted to be speaking in a whisper throughout the visit.  He does note that  he becomes more dizzy after taking his medications.  Lamictal  level was 17.8, Vimpat  level 7.3.   The patient lives with his parents and brother. He is on disability and mostly stays at home playing video games. He graduated high school, no special ed classes. He endorses olfactory hallucinations but cannot describe it except saying they are the same all the time.   Epilepsy Risk Factors:  He had a skull fracture on the right side after a fall at 49 months of age.  Otherwise he had a normal birth and early development.  There is no history of febrile convulsions, CNS infections such as meningitis/encephalitis, neurosurgical procedures, or family history of seizures.  Prior AEDs: Depakote, Dilantin, Zonegran , Epidiolex   EEGs: 48-hour EEG (03/17/14 to 03/19/14) abnormal with multifocal discharges and right temporal slowing. Prior records note generalized seizures, multiple foci He had a 72-hour EEG (09/26/16 to 09/29/16) which was abnormal with bursts and runs of focal slowing over the right temporal region, bursts and runs of diffuse rhythmic slowing with intermixed generalized spikes lasting up to 120 seconds without clinical correlate, multifocal epileptiform discharges seen over the right temporal, left frontal regions, as well as generalized spikes and polyspikes. During sleep, there were bursts of generalized fast activity. There were 2 clinicoelectrographic seizures captured with right head turn and some hypermotor activity that were non-lateralizing on EEG with diffuse background suppression at seizure onset.  MRI: I personally reviewed MRI brain with and without contrast done 12/26/2013 which did not show any acute intracranial abnormality, hippocampi symmetric without abnormal signal or enhancement.  He has had an extensive evaluation at [facility] in 2019 with repeat MRI brain 07/2017 showing no clear abnormality, PET scan with questionable decreased uptake on the left, Neuropsych testing suggesting more right-sided dysfunction. He underwent sEEG which was non-lateralizing, broad activity at seizure onset with low voltage fast activity from left and right, insular origin also suspicious. He had DBS placed last 10/2017 for non-resective stimulation treatment.    PAST MEDICAL HISTORY: Past Medical History:  Diagnosis Date   History of kidney stones    Seizures (HCC)    daily   Sleep apnea    no CPAP    MEDICATIONS: Current Outpatient Medications on File  Prior to Visit  Medication Sig Dispense Refill   cenobamate  (XCOPRI ) 200 MG TABS Take 1 tablet daily 90 tablet 3   cloBAZam  (ONFI ) 10 MG tablet Take 1 tablet twice a day 180 tablet 3   escitalopram  (LEXAPRO ) 10 MG tablet TAKE 1 TABLET BY MOUTH EVERY DAY 30 tablet 2   Lacosamide  150 MG TABS Take 1 tablet (150 mg total) by mouth 2 (two) times daily. 180 tablet 3   lamoTRIgine  (LAMICTAL ) 100 MG tablet TAKE 2 TABLETS BY MOUTH 3 TIMES DAILY 180 tablet 2   levETIRAcetam  (KEPPRA ) 1000 MG tablet TAKE 2 TABLETS BY MOUTH 2 TIMES DAILY 120 tablet 2   pregabalin  (LYRICA ) 225 MG capsule TAKE 1 CAPSULE BY MOUTH 3 TIMES DAILY 270 capsule 3   No current facility-administered medications on file prior to visit.    ALLERGIES: Allergies  Allergen Reactions   Penicillins Rash    Has patient had a PCN reaction causing immediate rash, facial/tongue/throat swelling, SOB or lightheadedness with hypotension: No Has patient had a PCN reaction causing severe rash involving mucus membranes or skin necrosis: No Has patient had a PCN reaction that required hospitalization No Has patient had a PCN reaction occurring within the last 10 years: No If all of the above  answers are NO, then may proceed with Cephalosporin use.     FAMILY HISTORY: Family History  Problem Relation Age of Onset   Healthy Mother    Healthy Father     SOCIAL HISTORY: Social History   Socioeconomic History   Marital status: Single    Spouse name: Not on file   Number of children: 0   Years of education: HS   Highest education level: 12th grade  Occupational History   Not on file  Tobacco Use   Smoking status: Former    Current packs/day: 0.00    Average packs/day: 0.5 packs/day for 4.0 years (2.0 ttl pk-yrs)    Types: Cigarettes    Start date: 03/03/1991    Quit date: 03/03/1995    Years since quitting: 29.0   Smokeless tobacco: Never  Vaping Use   Vaping status: Never Used  Substance and Sexual Activity   Alcohol use:  No    Alcohol/week: 0.0 standard drinks of alcohol   Drug use: No   Sexual activity: Not on file  Other Topics Concern   Not on file  Social History Narrative   Patient is single and lives by his self  Single story just steps to get on front porch dose not use front steps.   Patient has a high school education.   Patient is right-handed.   Patient does not have any children.   Patient is on disability.   Patient drinks three sodas daily.   Social Drivers of Corporate Investment Banker Strain: Low Risk  (03/04/2024)   Overall Financial Resource Strain (CARDIA)    Difficulty of Paying Living Expenses: Not hard at all  Food Insecurity: No Food Insecurity (03/04/2024)   Hunger Vital Sign    Worried About Running Out of Food in the Last Year: Never true    Ran Out of Food in the Last Year: Never true  Transportation Needs: No Transportation Needs (03/04/2024)   PRAPARE - Administrator, Civil Service (Medical): No    Lack of Transportation (Non-Medical): No  Physical Activity: Insufficiently Active (03/04/2024)   Exercise Vital Sign    Days of Exercise per Week: 1 day    Minutes of Exercise per Session: 20 min  Stress: No Stress Concern Present (03/04/2024)   [employer] of Occupational Health - Occupational Stress Questionnaire    Feeling of Stress: Only a little  Social Connections: Moderately Isolated (03/04/2024)   Social Connection and Isolation Panel    Frequency of Communication with Friends and Family: Three times a week    Frequency of Social Gatherings with Friends and Family: Once a week    Attends Religious Services: 1 to 4 times per year    Active Member of [employer] or Organizations: No    Attends Engineer, Structural: Not on file    Marital Status: Never married  Intimate Partner Violence: Not At Risk (03/05/2024)   Humiliation, Afraid, Rape, and Kick questionnaire    Fear of Current or Ex-Partner: No    Emotionally Abused: No    Physically  Abused: No    Sexually Abused: No     PHYSICAL EXAM: Vitals:   03/12/24 1052  BP: 108/74  Pulse: 75  SpO2: 93%   General: No acute distress, well-healing chest incision Head:  Normocephalic/atraumatic Skin/Extremities: No rash, no edema Neurological Exam: alert and awake. No aphasia or dysarthria. Fund of knowledge is appropriate.  Attention and concentration are normal.   Cranial nerves: Pupils  equal, round. Extraocular movements intact with no nystagmus. Visual fields full.  No facial asymmetry.  Motor: Bulk and tone normal, muscle strength 5/5 throughout with no pronator drift.   Finger to nose testing intact.  Gait slow and cautious, slightly wide-based, no ataxia. No tremors.   VNS Therapy Management: Unit Information Implant Date: 02/13/24 Serial Number: [id number] Generator Number:  (SenTiva [id number]) Parameters Output Current (mA): 0.25 Signal Frequency (Hz): 20 Pulse Width (usec): 250 Signal ON Time (sec): 30 Signal OFF Time (min): 5 Magnet Output Current (mA): 0.5 Magnet ON Time (sec): 60 Magnet Pulse Width (usec): 250 AutoStim Output Current (mA): 0.375 AutoStim Pulse Width (usec): 250 AutoStim ON Time (sec): 60 Tachycardia Detection : On Heartbeat Detection Sensitivity: 3 Perform Verify Heartbeat Detection: yes Threshold for AutoStim (%): 20% Diagnostics Lead Impedance: OK Impedence Value (Ohms): 3037 Battery Status Indicator (color): Green (75-100%)   IMPRESSION: 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 Family reports seizures started at age 71. He is on 6 ASMs and s/p VNS and DBS placement. On last visit, VNS was at EOS and they were reporting 5-10 seizures daily (previously 1-2 daily in 08/2022). He had 3 falls in September due to seizures. He had VNS battery replacement 11/11 and presents today with more than 3 parameters changed. Output current at 0.25 with Sentiva custom protocol in place. He tolerated VNS changes made without difficulty.  Continue Xcopri  200mg  daily, Lacosamide  150mg  BID, clobazam  10mg  qhs, Levetiracetam  2000mg  BID, Lamotrigine  200mg  TID, and Pregabalin  225mg  TID. Monitor dizziness when he takes medications with meals, if no improvement, we may need to reduce Lacosamide  dose. He does not drive. Follow-up in 3 months, call for any changes.   Thank you for allowing me to participate in his care.  Please do not hesitate to call for any questions or concerns.    Darice Shivers, M.D.   CC: Dr. Katrinka

## 2024-03-15 ENCOUNTER — Encounter: Payer: Self-pay | Admitting: Family Medicine

## 2024-03-15 ENCOUNTER — Telehealth: Payer: Self-pay

## 2024-03-15 MED ORDER — SEMAGLUTIDE-WEIGHT MANAGEMENT 0.25 MG/0.5ML ~~LOC~~ SOAJ: 0.2500 mg | SUBCUTANEOUS | 5 refills | Status: AC

## 2024-03-15 NOTE — Telephone Encounter (Signed)
 No diabetes so would not be covered for Ozempic. I sent in Bovina which is same drug- if he tolerates first month let me send in higher dose of 0.5 mg- but this is a starter dose to help his body get used to the drug

## 2024-03-20 ENCOUNTER — Encounter: Payer: Self-pay | Admitting: Family Medicine

## 2024-03-20 ENCOUNTER — Telehealth: Payer: Self-pay

## 2024-03-20 ENCOUNTER — Other Ambulatory Visit (HOSPITAL_COMMUNITY): Payer: Self-pay

## 2024-03-20 NOTE — Telephone Encounter (Signed)
 Pharmacy Patient Advocate Encounter   Received notification from Onbase that prior authorization for Wegovy  0.25MG /0.5ML auto-injectors is required/requested.   Insurance verification completed.   The patient is insured through [facility].   Per test claim: PA required; PA submitted to above mentioned insurance via Latent Key/confirmation #/EOC [id number] Status is pending

## 2024-03-21 NOTE — Telephone Encounter (Signed)
 Pharmacy Patient Advocate Encounter  Received notification from [employer] that Prior Authorization for  Wegovy  0.25MG /0.5ML auto-injectors  has been DENIED.  Full denial letter will be uploaded to the media tab. See denial reason below.   PA #/Case ID/Reference #: [id number]

## 2024-04-02 ENCOUNTER — Encounter: Payer: Self-pay | Admitting: Neurology

## 2024-04-08 ENCOUNTER — Telehealth: Payer: Self-pay | Admitting: Neurology

## 2024-04-08 NOTE — Telephone Encounter (Signed)
 [facility] call ID: [id number]  [facility]: [phone]  Caller(Brenda;mom) states her son has slurred speech and issues with his balance.

## 2024-04-08 NOTE — Telephone Encounter (Signed)
 Called pt mother he is refusing to go to the hospital he is having stroke symptoms they started this weekend they are not getting better, she was advised he needs to go the ER.

## 2024-04-09 ENCOUNTER — Encounter: Payer: Self-pay | Admitting: Neurology

## 2024-04-09 ENCOUNTER — Telehealth: Admitting: Neurology

## 2024-04-09 VITALS — Ht 67.0 in | Wt 282.0 lb

## 2024-04-09 DIAGNOSIS — R4781 Slurred speech: Secondary | ICD-10-CM | POA: Diagnosis not present

## 2024-04-09 DIAGNOSIS — R42 Dizziness and giddiness: Secondary | ICD-10-CM

## 2024-04-09 DIAGNOSIS — G40211 Localization-related (focal) (partial) symptomatic epilepsy and epileptic syndromes with complex partial seizures, intractable, with status epilepticus: Secondary | ICD-10-CM

## 2024-04-09 DIAGNOSIS — H02401 Unspecified ptosis of right eyelid: Secondary | ICD-10-CM | POA: Diagnosis not present

## 2024-04-09 NOTE — Patient Instructions (Signed)
 I hope you feel better soon.  Schedule head CT without contrast  2. Reduce Vimpat  (Lacosamide ) 150mg : take 1/2 tablet every morning, 1 tablet every night  3. Continue all your other medications

## 2024-04-09 NOTE — Progress Notes (Signed)
 "  Virtual Visit via Video Note  This visit type was conducted with patient consent. This format is felt to be most appropriate for this patient at this time. Physical exam was limited by quality of the video and audio technology used for the visit.    Consent was obtained for video visit:  Yes.   Answered questions that patient had about telehealth interaction:  Yes.   Patient is aware of the limitations, risks, security and privacy concerns of performing an evaluation and management service by telemedicine. The patient expressed understanding and agreed to proceed.  Pt location: Home Physician Location: office Name of referring provider:  Katrinka Garnette KIDD, MD I connected with Gregg Young at patients initiation/request on 04/09/2024 at  3:40 PM EST by video enabled telemedicine application and verified that I am speaking with the correct person using two identifiers. Pt MRN:  [id number] Pt DOB:  10-12-1975 Video Participants:  Gregg Young;  Sheran Young (mother)  Discussed the use of AI scribe software for clinical note transcription with the patient, who gave verbal consent to proceed.  History of Present Illness The patient had a virtual video visit on 04/09/2024. He was last seen in the neurology clinic a month ago and presents for an urgent visit due to a change in symptoms. His mother is present to provide additional information. His mother contacted our office about dizziness and slurred speech. They report symptoms started around 10 days ago, he denies any vertigo or lightheadedness, the dizziness primarily is a balance issue. He has difficulty walking, they went to a hockey game and he had difficulty then, with legs feeling weak. Symptoms are worse at the end of the day. Erminio notes that his speech becomes very slurred in the evenings, although it is clear in the mornings. This slurred speech is consistent and lasts until he goes to sleep.  He mentions that his right eye often  stays almost closed and is sometimes blurry, a condition that has been ongoing and was not resolved by previous surgery. No major headaches are reported, and vision in the left eye is clear.  No recent illness such as coughs or colds.  They do note a reduction in seizures since VNS battery was replaced. He has gone a few days without any seizures when previously he was having several seizures a day. Last fall was last night when he almost fell into the TV (not from a seizure). He is on Xcopri  200mg  daily, Lacosamide  150mg  BID, clobazam  10mg  qhs, Levetiracetam  2000mg  BID, Lamotrigine  200mg  TID, and Pregabalin  225mg  TID.   Seizure History: This is a 49 yo RH man with a history of seizures since age 70. He has no recollection of events, no prior warning, witnessed by his mother to have a generalized convulsion lasting 90 to 120 seconds. He was brought to [facility] then had another convulsion 2 days later. They recall trying different medications, Depakote caused liver dysfunction, he has failed Dilantin and Zonegran . He has been on Keppra , Lamictal , Lyrica , and most recently Vimpat .  He had an EMU admission at [facility], records unavailable for review, per notes he stayed for 36 hours and had generalized seizures, multiple foci.  They report two admissions for status epilepticus, one in September 2002 in the setting of weaning off Depakote, and another in January 2003.  Records unavailable for review.  His last GTC was around 5 years ago. He continued to have petit mals several times a day where his eyes would  roll back, hands would shake for 30-45 seconds, if standing he would fall and had required sutures and staples in the past. He had been having the petit mals several times daily until 3 weeks ago when he started having a different type of episode and the petit mals completely stopped.  He was brought to the ER on 12/07/13 when his parents awoke to him dry having and speaking gibberish. He would be unable  to speak, control his limbs, and cannot stand up without assistance. He can hear people around him but his jaw feels tight. The episodes can last for several hours. He has had 4 episodes in the past 3 weeks.  The patient reports that they have been occurring only during the weekends, however his parents remind him he had one on Wednesday. He went to his neurologist's office on 09/10 where he had an episode while walking to the exam room where he became dizzy and fell.  His father was able to catch him, and they reported his speech became affected. He was noted to be speaking in a whisper throughout the visit.  He does note that he becomes more dizzy after taking his medications.  Lamictal  level was 17.8, Vimpat  level 7.3.   The patient lives with his parents and brother. He is on disability and mostly stays at home playing video games. He graduated high school, no special ed classes. He endorses olfactory hallucinations but cannot describe it except saying they are the same all the time.   Epilepsy Risk Factors:  He had a skull fracture on the right side after a fall at 57 months of age. Otherwise he had a normal birth and early development.  There is no history of febrile convulsions, CNS infections such as meningitis/encephalitis, neurosurgical procedures, or family history of seizures.  Prior AEDs: Depakote, Dilantin, Zonegran , Epidiolex   EEGs: 48-hour EEG (03/17/14 to 03/19/14) abnormal with multifocal discharges and right temporal slowing. Prior records note generalized seizures, multiple foci He had a 72-hour EEG (09/26/16 to 09/29/16) which was abnormal with bursts and runs of focal slowing over the right temporal region, bursts and runs of diffuse rhythmic slowing with intermixed generalized spikes lasting up to 120 seconds without clinical correlate, multifocal epileptiform discharges seen over the right temporal, left frontal regions, as well as generalized spikes and polyspikes. During sleep,  there were bursts of generalized fast activity. There were 2 clinicoelectrographic seizures captured with right head turn and some hypermotor activity that were non-lateralizing on EEG with diffuse background suppression at seizure onset.  MRI: I personally reviewed MRI brain with and without contrast done 12/26/2013 which did not show any acute intracranial abnormality, hippocampi symmetric without abnormal signal or enhancement.  He has had an extensive evaluation at [facility] in 2019 with repeat MRI brain 07/2017 showing no clear abnormality, PET scan with questionable decreased uptake on the left, Neuropsych testing suggesting more right-sided dysfunction. He underwent sEEG which was non-lateralizing, broad activity at seizure onset with low voltage fast activity from left and right, insular origin also suspicious. He had DBS placed last 10/2017 for non-resective stimulation treatment.     Medications Ordered Prior to Encounter[1]   Observations/Objective:   Vitals:   04/09/24 1358  Weight: 282 lb (127.9 kg)  Height: 5' 7 (1.702 m)   GEN:  The patient appears stated age and is in NAD.  Neurological examination: Patient is awake, alert. Speech is slightly slurred but clear. No aphasia. Intact fluency and comprehension. Cranial nerves: He again has  right ptosis but is able to open the eyelid with intact extraocular movements, no nystagmus. No facial asymmetry. Motor: moves all extremities symmetrically, at least anti-gravity x 4. No incoordination on finger to nose testing.     Assessment and Plan:   This is a 49 yo RH man with intractable multifocal epilepsy presenting for urgent visit for new symptoms of gait imbalance/dizziness, slurred speech, and recurrence of right ptosis for the past 10 days. Etiology unclear, possibly due to interaction between increased dose of Xcopri  and multiple seizure medications. Advised to reduce Lacosamide  150mg : take 1/2 tablet in AM, 1 tablet in PM. Continue  Xcopri  200mg  daily,clobazam  10mg  qhs, Levetiracetam  2000mg  BID, Lamotrigine  200mg  TID, and Pregabalin  225mg  TID. Head CT without contrast will be ordered to assess for underlying structural abnormality. Thankfully there has been a reduction in seizures with VNS battery replacement. Follow-up as scheduled in March, call for any changes.    Follow Up Instructions:    -I discussed the assessment and treatment plan with the patient. The patient was provided an opportunity to ask questions and all were answered. The patient agreed with the plan and demonstrated an understanding of the instructions.   The patient was advised to call back or seek an in-person evaluation if the symptoms worsen or if the condition fails to improve as anticipated.     Darice CHRISTELLA Shivers, MD    [1]  Current Outpatient Medications on File Prior to Visit  Medication Sig Dispense Refill   cenobamate  (XCOPRI ) 200 MG TABS Take 1 tablet daily 90 tablet 3   cloBAZam  (ONFI ) 10 MG tablet Take 1 tablet twice a day (Patient taking differently: daily. Take 1 tablet at bed time) 180 tablet 3   escitalopram  (LEXAPRO ) 10 MG tablet TAKE 1 TABLET BY MOUTH EVERY DAY 30 tablet 2   Lacosamide  150 MG TABS Take 1 tablet (150 mg total) by mouth 2 (two) times daily. 180 tablet 3   lamoTRIgine  (LAMICTAL ) 100 MG tablet TAKE 2 TABLETS BY MOUTH 3 TIMES DAILY 180 tablet 2   levETIRAcetam  (KEPPRA ) 1000 MG tablet TAKE 2 TABLETS BY MOUTH 2 TIMES DAILY 120 tablet 2   pregabalin  (LYRICA ) 225 MG capsule TAKE 1 CAPSULE BY MOUTH 3 TIMES DAILY 270 capsule 3   semaglutide -weight management (WEGOVY ) 0.25 MG/0.5ML SOAJ SQ injection Inject 0.25 mg into the skin once a week. (Patient not taking: Reported on 04/09/2024) 2 mL 5   No current facility-administered medications on file prior to visit.   "

## 2024-04-09 NOTE — Telephone Encounter (Signed)
 Scheduled 1/6

## 2024-04-13 ENCOUNTER — Encounter: Payer: Self-pay | Admitting: Neurology

## 2024-04-15 ENCOUNTER — Encounter: Payer: Self-pay | Admitting: Neurology

## 2024-04-15 ENCOUNTER — Inpatient Hospital Stay: Admission: RE | Admit: 2024-04-15 | Discharge: 2024-04-15 | Attending: Neurology

## 2024-04-15 DIAGNOSIS — R4781 Slurred speech: Secondary | ICD-10-CM

## 2024-04-15 DIAGNOSIS — H02401 Unspecified ptosis of right eyelid: Secondary | ICD-10-CM

## 2024-04-15 DIAGNOSIS — G40211 Localization-related (focal) (partial) symptomatic epilepsy and epileptic syndromes with complex partial seizures, intractable, with status epilepticus: Secondary | ICD-10-CM

## 2024-04-15 DIAGNOSIS — R42 Dizziness and giddiness: Secondary | ICD-10-CM

## 2024-04-16 MED ORDER — BRIVARACETAM 100 MG PO TABS: ORAL_TABLET | ORAL | 5 refills | Status: AC

## 2024-04-17 ENCOUNTER — Telehealth: Payer: Self-pay | Admitting: Neurology

## 2024-04-17 ENCOUNTER — Telehealth: Payer: Self-pay | Admitting: Pharmacy Technician

## 2024-04-17 ENCOUNTER — Other Ambulatory Visit (HOSPITAL_COMMUNITY): Payer: Self-pay

## 2024-04-17 NOTE — Telephone Encounter (Signed)
 Pt was given samples until we can get PA  Medication Samples have been provided to the patient.  Drug name: briviact        Strength: 100mg         Qty: 3 box  LOT: [id number]  Exp.Date: 03/06/28  Dosing instructions: pt is doing a titration   The patient has been instructed regarding the correct time, dose, and frequency of taking this medication, including desired effects and most common side effects.

## 2024-04-17 NOTE — Telephone Encounter (Signed)
 Pt needs an URGENT pa for Briviact  100mg 

## 2024-04-17 NOTE — Telephone Encounter (Signed)
 PA has been submitted, and telephone encounter has been created. Please see telephone encounter dated 1.14.26.

## 2024-04-17 NOTE — Telephone Encounter (Signed)
 Pharmacy Patient Advocate Encounter   Received notification from Pt Calls Messages that prior authorization for BRIVIACT  100MG  is required/requested.   Insurance verification completed.   The patient is insured through [facility].   Per test claim: PA required; PA submitted to above mentioned insurance via Latent Key/confirmation #/[id number] [facility] Status is pending

## 2024-04-17 NOTE — Telephone Encounter (Signed)
 Pt's Mom Erminio called in this afternoon. Erminio stated that she tried to pick up Pt's prescription called Brivaracetam  , but she was told they need a prior authorization, Erminio took Pt off one of the medication he was on,  because she though she could get the Brivaracetam . Thanks

## 2024-04-20 ENCOUNTER — Encounter: Payer: Self-pay | Admitting: Neurology

## 2024-04-24 ENCOUNTER — Telehealth: Payer: Self-pay

## 2024-04-24 NOTE — Telephone Encounter (Signed)
 Pt mother called informed that with pt still declining in his health that he needs to go to  the hospital for a full work she said she would try to get him to go.  She was blaming it on medication change I told her that she was calling before we made the change and she needs to take him for the evaluation before she finds him in a bad state she stated again that she would try her best. Dr Georjean was made aware,

## 2024-04-26 ENCOUNTER — Encounter: Payer: Self-pay | Admitting: Neurology

## 2024-04-30 NOTE — Telephone Encounter (Signed)
 Spoke to mother. He is just going through a very difficult time. Several days where he could not walk without assistance, that has improved, he has been walking independently since Sunday at least. He had a fall this morning, he woke up on the bathroom floor, mom not sure if he has had a seizure. Mom has not seen any seizure since the VNS battery was replaced. He is still feeling dizzy. He is sleeping a lot more during the daytime hours. He is on Briviact  150mg  BID, but he was having dizziness and needing help even before this. Mom thinks he is just frustrated. They had not reduced the Lacosamide  as previously discussed, advised to go ahead and reduce to 1/2 tablet in AM, 1 tablet in PM. Mom has been giving his TID dosing at 8am-12nn-8pm. Discussed concern for stroke with gait instability and dizziness, we will need to check on doing MRI with both VNS and DBS needing to be off (although DBS battery is likely dead at this point). Start daily aspirin 81mg  while awaiting MRI.

## 2024-05-01 ENCOUNTER — Encounter: Payer: Self-pay | Admitting: Neurology

## 2024-05-02 ENCOUNTER — Encounter: Payer: Self-pay | Admitting: Neurology

## 2024-05-07 ENCOUNTER — Encounter: Payer: Self-pay | Admitting: Family Medicine

## 2024-05-07 ENCOUNTER — Encounter: Payer: Self-pay | Admitting: Neurology

## 2024-06-28 ENCOUNTER — Ambulatory Visit: Admitting: Neurology

## 2025-03-10 ENCOUNTER — Ambulatory Visit
